# Patient Record
Sex: Female | Born: 1980 | Race: White | Hispanic: No | Marital: Married | Smoking: Former smoker
Health system: Southern US, Community
[De-identification: ages and names within clinical notes are randomized; demographics above are authoritative.]

## PROBLEM LIST (undated history)

## (undated) ENCOUNTER — Inpatient Hospital Stay: Payer: Self-pay

## (undated) DIAGNOSIS — D649 Anemia, unspecified: Secondary | ICD-10-CM

## (undated) DIAGNOSIS — Z8041 Family history of malignant neoplasm of ovary: Secondary | ICD-10-CM

## (undated) DIAGNOSIS — F419 Anxiety disorder, unspecified: Secondary | ICD-10-CM

## (undated) DIAGNOSIS — D509 Iron deficiency anemia, unspecified: Secondary | ICD-10-CM

## (undated) DIAGNOSIS — Z803 Family history of malignant neoplasm of breast: Secondary | ICD-10-CM

## (undated) DIAGNOSIS — O09522 Supervision of elderly multigravida, second trimester: Secondary | ICD-10-CM

## (undated) DIAGNOSIS — O0993 Supervision of high risk pregnancy, unspecified, third trimester: Secondary | ICD-10-CM

## (undated) DIAGNOSIS — O321XX0 Maternal care for breech presentation, not applicable or unspecified: Secondary | ICD-10-CM

## (undated) DIAGNOSIS — J45909 Unspecified asthma, uncomplicated: Secondary | ICD-10-CM

## (undated) HISTORY — DX: Anemia, unspecified: D64.9

## (undated) HISTORY — PX: APPENDECTOMY: SHX54

## (undated) HISTORY — DX: Iron deficiency anemia, unspecified: D50.9

## (undated) HISTORY — DX: Family history of malignant neoplasm of ovary: Z80.41

## (undated) HISTORY — DX: Family history of malignant neoplasm of breast: Z80.3

---
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
## 2011-10-23 DIAGNOSIS — Q249 Congenital malformation of heart, unspecified: Secondary | ICD-10-CM

## 2017-07-19 LAB — OB RESULTS CONSOLE VARICELLA ZOSTER ANTIBODY, IGG: Varicella: IMMUNE

## 2017-07-19 LAB — OB RESULTS CONSOLE TSH: TSH: 2.6

## 2017-07-19 LAB — OB RESULTS CONSOLE ANTIBODY SCREEN: Antibody Screen: NEGATIVE

## 2017-07-19 LAB — OB RESULTS CONSOLE ABO/RH: RH Type: POSITIVE

## 2017-07-19 LAB — OB RESULTS CONSOLE PLATELET COUNT: Platelets: 245

## 2017-07-19 LAB — OB RESULTS CONSOLE HIV ANTIBODY (ROUTINE TESTING): HIV: NONREACTIVE

## 2017-07-19 LAB — OB RESULTS CONSOLE RUBELLA ANTIBODY, IGM: Rubella: IMMUNE

## 2017-07-19 LAB — OB RESULTS CONSOLE HEPATITIS B SURFACE ANTIGEN: Hepatitis B Surface Ag: NEGATIVE

## 2017-07-19 LAB — OB RESULTS CONSOLE RPR: RPR: NONREACTIVE

## 2017-07-19 LAB — OB RESULTS CONSOLE HGB/HCT, BLOOD: Hemoglobin: 11.7

## 2017-08-16 ENCOUNTER — Telehealth: Payer: Self-pay

## 2017-08-16 NOTE — Telephone Encounter (Signed)
Pt reports light pink/red bleeding when wiping. She has been cramping since last night. At times it doubles her over. Advised to report to ER for eval due to time of day & apt availibility here at the office.

## 2017-08-16 NOTE — Telephone Encounter (Signed)
Pt is [redacted] wks pregnant. Her first apt is 08/29/17 w/CS. Pt states she had blood on her tissue when she wiped after urinating. Pt inquiring if she needs to see her current OB or come see us? [id number]#[phone]#[phone]

## 2017-08-16 NOTE — Telephone Encounter (Signed)
LMVM TRC. 

## 2017-08-16 NOTE — Telephone Encounter (Signed)
LMVM TRC. Pt advised can see current OB if they are able to see her as we are booked today. Need more details from patient regarding amount of blood etc. Advised if current OB unable to see we will try to fit in or she can be seen at PCP/Urgent care.

## 2017-08-16 NOTE — Telephone Encounter (Signed)
Patient returning call, same cb.  

## 2017-08-29 ENCOUNTER — Telehealth: Payer: Self-pay

## 2017-08-29 ENCOUNTER — Ambulatory Visit (INDEPENDENT_AMBULATORY_CARE_PROVIDER_SITE_OTHER): Payer: Medicaid Other | Admitting: Obstetrics and Gynecology

## 2017-08-29 ENCOUNTER — Encounter: Payer: Self-pay | Admitting: Obstetrics and Gynecology

## 2017-08-29 VITALS — BP 102/64 | Ht 66.0 in | Wt 181.0 lb

## 2017-08-29 DIAGNOSIS — O09293 Supervision of pregnancy with other poor reproductive or obstetric history, third trimester: Secondary | ICD-10-CM | POA: Insufficient documentation

## 2017-08-29 DIAGNOSIS — O09522 Supervision of elderly multigravida, second trimester: Secondary | ICD-10-CM | POA: Insufficient documentation

## 2017-08-29 DIAGNOSIS — F419 Anxiety disorder, unspecified: Secondary | ICD-10-CM | POA: Insufficient documentation

## 2017-08-29 DIAGNOSIS — O0993 Supervision of high risk pregnancy, unspecified, third trimester: Secondary | ICD-10-CM

## 2017-08-29 DIAGNOSIS — Z1379 Encounter for other screening for genetic and chromosomal anomalies: Secondary | ICD-10-CM

## 2017-08-29 DIAGNOSIS — F32A Depression, unspecified: Secondary | ICD-10-CM | POA: Insufficient documentation

## 2017-08-29 DIAGNOSIS — Z8632 Personal history of gestational diabetes: Secondary | ICD-10-CM

## 2017-08-29 DIAGNOSIS — Z3A10 10 weeks gestation of pregnancy: Secondary | ICD-10-CM

## 2017-08-29 DIAGNOSIS — O09291 Supervision of pregnancy with other poor reproductive or obstetric history, first trimester: Secondary | ICD-10-CM

## 2017-08-29 DIAGNOSIS — F329 Major depressive disorder, single episode, unspecified: Secondary | ICD-10-CM

## 2017-08-29 DIAGNOSIS — O09891 Supervision of other high risk pregnancies, first trimester: Secondary | ICD-10-CM

## 2017-08-29 DIAGNOSIS — O09521 Supervision of elderly multigravida, first trimester: Secondary | ICD-10-CM

## 2017-08-29 DIAGNOSIS — O219 Vomiting of pregnancy, unspecified: Secondary | ICD-10-CM

## 2017-08-29 HISTORY — DX: Supervision of elderly multigravida, second trimester: O09.522

## 2017-08-29 HISTORY — DX: Supervision of high risk pregnancy, unspecified, third trimester: O09.93

## 2017-08-29 MED ORDER — DOXYLAMINE SUCCINATE (SLEEP) 25 MG PO TABS: 25.0000 mg | ORAL_TABLET | Freq: Every day | ORAL | 3 refills | Status: DC

## 2017-08-29 MED ORDER — SERTRALINE HCL 50 MG PO TABS: 50.0000 mg | ORAL_TABLET | Freq: Every day | ORAL | 11 refills | Status: DC

## 2017-08-29 MED ORDER — PYRIDOXINE HCL 25 MG PO TABS: 25.0000 mg | ORAL_TABLET | Freq: Four times a day (QID) | ORAL | 3 refills | Status: DC

## 2017-08-29 MED ORDER — CITRANATAL ASSURE 35-1 & 300 MG PO MISC: 2.0000 | Freq: Every day | ORAL | 11 refills | Status: DC

## 2017-08-29 NOTE — Telephone Encounter (Signed)
Please advise. Thank you

## 2017-08-29 NOTE — Progress Notes (Signed)
NOB transfer  Worried might be carrying twins again.  C/o Nausea/having trouble sleeping, vaginal bleeding and cramping/ anxiety

## 2017-08-29 NOTE — Progress Notes (Signed)
03/07/2020   Chief Complaint: Missed period  Transfer of Care Patient: yes  History of Present Illness: Ms. Kristen Mcpherson is a 37 y.o. [id number] [redacted]w[redacted]d based on Patient's last menstrual period was 06/20/2017 (exact date). with an Estimated Date of Delivery: 03/27/18, with the above CC.   Her periods were: regular periods every 28 days She was using no method when she conceived.  She has Positive signs or symptoms of nausea/vomiting of pregnancy. She has Negative signs or symptoms of miscarriage or preterm labor She identifies Negative Zika risk factors for her and her partner On any different medications around the time she conceived/early pregnancy: No  History of varicella: Yes   ROS: A 12-point review of systems was performed and negative, except as stated in the above HPI.  OBGYN History: As per HPI. OB History  Gravida Para Term Preterm AB Living  5 5 4 1   6   SAB IAB Ectopic Multiple Live Births        1 6    # Outcome Date GA Lbr Len/2nd Weight Sex Delivery Anes PTL Lv  5 Term 03/20/18 [redacted]w[redacted]d  9 lb 14.7 oz (4.5 kg) F CS-LTranv Spinal  LIV  4A Preterm 10/23/11 [redacted]w[redacted]d  5 lb 4 oz (2.381 kg) F Vag-Spont   LIV  4B Preterm 10/23/11 [redacted]w[redacted]d  6 lb 9 oz (2.977 kg) M Vag-Spont   LIV     Complications: Heart abnormality  3 Term 10/09/05 [redacted]w[redacted]d  6 lb 7 oz (2.92 kg) M Vag-Spont   LIV  2 Term 09/20/03 [redacted]w[redacted]d  6 lb 9 oz (2.977 kg) M Vag-Spont   LIV  1 Term 07/04/99 [redacted]w[redacted]d  7 lb 14 oz (3.572 kg) M Vag-Spont   LIV    Any issues with any prior pregnancies: no Any prior children are healthy, doing well, without any problems or issues: yes History of pap smears: Yes. Last pap smear 2019. Abnormal: yes HSIL pap, had colposcopy. Patient not sure of results.  History of STIs:  History of HSV.   Past Medical History: Past Medical History:  Diagnosis Date  . AMA (advanced maternal age) multigravida 37+, second trimester 08/29/2017  . Anemia   . Anxiety   . Asthma   . Breech presentation delivered 03/20/2018   . Cesarean delivery indicated due to breech presentation 04/03/2018  . 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 Antibody:  Anatomic [place]  complete Rubella: Immune (07/19 0000)  Varicella: Immune GTT Early: WNLThird trimester:  RPR: Nonreactive (07/19 0000)  Rhogam  not needed HBsAg: Negative (07/19 0000)  TDaP vaccine                    . Iron deficiency anemia 01/08/2018    Past Surgical History: Past Surgical History:  Procedure Laterality Date  . APPENDECTOMY    . CERVICAL CONIZATION W/BX N/A 06/12/2018   Procedure: CONIZATION CERVIX WITH BIOPSY - COLD KNIFE;  Surgeon: Nadara Mustard, MD;  Location: [facility] ORS;  Service: Gynecology;  Laterality: N/A;  . CESAREAN SECTION N/A 03/20/2018   Procedure: CESAREAN SECTION;  Surgeon: Nadara Mustard, MD;  Location: [facility] ORS;  Service: Obstetrics;  Laterality: N/A;  . TUBAL LIGATION Bilateral 03/20/2018   Procedure: BILATERAL TUBAL LIGATION;  Surgeon: Nadara Mustard, MD;  Location: [facility] ORS;  Service: Obstetrics;  Laterality: Bilateral;  Family History:  Family History  Problem Relation Age of Onset  . Diabetes Mother   . Arthritis Mother   . Depression Mother   . Anxiety disorder Mother   . Anemia Mother   . Stroke Mother   . Restless legs syndrome Mother   . Hyperlipidemia Father   . Depression Father   . Hypertension Father   . ADD / ADHD Brother   . Diabetes Maternal Grandmother   . Liver disease Maternal Grandmother   . Rheum arthritis Maternal Grandfather   . Dementia Maternal Grandfather    She denies any female cancers, bleeding or blood clotting disorders.  She denies any history of mental retardation, birth defects or genetic disorders in her or the FOB's history  Social History:  Social History   Socioeconomic History  . Marital status:  Married    Spouse name: Not on file  . Number of children: 6  . Years of education: Not on file  . Highest education level: Not on file  Occupational History  . Occupation: Stay at home mother  Tobacco Use  . Smoking status: Current Every Day Smoker    Packs/day: 0.50    Types: Cigarettes    Start date: 01/02/2000  . Smokeless tobacco: Never Used  Vaping Use  . Vaping Use: Never used  Substance and Sexual Activity  . Alcohol use: Yes    Comment: once in a blue moon  . Drug use: Never  . Sexual activity: Yes    Birth control/protection: Surgical    Comment: BTL  Other Topics Concern  . Not on file  Social History Narrative   Is engaged has 6 children and her fiance has 2 children. Takes care of 8 children all together.   Social Determinants of Health   Financial Resource Strain: Not on file  Food Insecurity: Not on file  Transportation Needs: Not on file  Physical Activity: Not on file  Stress: Not on file  Social Connections: Not on file  Intimate Partner Violence: Not on file   Any pets in the household: no    Allergy: No Known Allergies  Current Outpatient Medications:  Current Outpatient Medications:  .  levonorgestrel (MIRENA) 20 MCG/24HR IUD, 1 each by Intrauterine route once., Disp: , Rfl:    Physical Exam:   BP 102/64   Ht 5\' 6"  (1.676 m)   Wt 181 lb (82.1 kg)   LMP 06/20/2017 (Exact Date)   BMI 29.21 kg/m  Body mass index is 29.21 kg/m. Constitutional: Well nourished, well developed female in no acute distress.  Neck:  Supple, normal appearance, and no thyromegaly  Cardiovascular: S1, S2 normal, no murmur, rub or gallop, regular rate and rhythm Respiratory:  Clear to auscultation bilateral. Normal respiratory effort Abdomen: positive bowel sounds and no masses, hernias; diffusely non tender to palpation, non distended Breasts: breasts appear normal, no suspicious masses, no skin or nipple changes or axillary nodes. Neuro/Psych:  Normal mood and  affect.  Skin:  Warm and dry.  Lymphatic:  No inguinal lymphadenopathy.   Pelvic exam: is not limited by body habitus EGBUS: within normal limits, Vagina: within normal limits and with no blood in the vault, Cervix: normal appearing cervix without discharge or lesions, closed/long/high, Uterus:  nonenlarged, and Adnexa:  normal adnexa  Assessment: Ms. Kristen Mcpherson is a 37 y.o. [id number] [id number] based on Patient's last menstrual period was 06/20/2017 (exact date). with an Estimated Date of Delivery: 03/27/18,  for prenatal care.  Plan:  1) Avoid alcoholic  beverages. 2) Patient encouraged not to smoke.  3) Discontinue the use of all non-medicinal drugs and chemicals.  4) Take prenatal vitamins daily.  5) Seatbelt use advised 6) Nutrition, food safety (fish, cheese advisories, and high nitrite foods) and exercise discussed. 7) Hospital and practice style delivering at [facility] discussed  8) Patient is asked about travel to areas at risk for the Zika virus, and counseled to avoid travel and exposure to mosquitoes or sexual partners who may have themselves been exposed to the virus. Testing is discussed, and will be ordered as appropriate.  9) Childbirth classes at [facility] advised  Bedside US showed singleton IUP, FHR 172 bpm Early 1hr GTT at next visit.  Need records of colposcopy, She had a HSIL pap in January of 2019.  Send B6 unisom and PNV prescriptions to pharmacy [facility] and inheritest testing today Texted information about Mychart sign up.   Adelene Idler MD [facility], [facility] 03/07/20 7:53 AM

## 2017-08-29 NOTE — Telephone Encounter (Signed)
Pt needs something else sent in, the sleeping meds and vitamin d not covered by her insurance

## 2017-08-30 MED ORDER — DOXYLAMINE-PYRIDOXINE 10-10 MG PO TBEC: 2.0000 | DELAYED_RELEASE_TABLET | Freq: Every day | ORAL | 5 refills | Status: DC

## 2017-08-30 NOTE — Telephone Encounter (Signed)
I will send diclegis, but that may not be covered either. It seems odd the B6 and unisom would not be covered, maybe her medicaid is not active for the pregnancy yet.

## 2017-09-03 ENCOUNTER — Telehealth: Payer: Self-pay

## 2017-09-03 NOTE — Telephone Encounter (Signed)
Pt requesting test results. Saw CS

## 2017-09-03 NOTE — Telephone Encounter (Signed)
Called pt to let her know that her results were not in and we would gladly call her when they come in.

## 2017-09-04 LAB — MATERNIT 21 PLUS CORE, BLOOD
Chromosome 13: NEGATIVE
Chromosome 18: NEGATIVE
Chromosome 21: NEGATIVE
Y Chromosome: NOT DETECTED

## 2017-09-04 NOTE — Progress Notes (Signed)
Normal XX

## 2017-09-05 ENCOUNTER — Other Ambulatory Visit: Payer: Medicaid Other

## 2017-09-05 ENCOUNTER — Ambulatory Visit (INDEPENDENT_AMBULATORY_CARE_PROVIDER_SITE_OTHER): Payer: Medicaid Other | Admitting: Obstetrics and Gynecology

## 2017-09-05 ENCOUNTER — Encounter: Payer: Self-pay | Admitting: Obstetrics and Gynecology

## 2017-09-05 ENCOUNTER — Other Ambulatory Visit (HOSPITAL_COMMUNITY)
Admission: RE | Admit: 2017-09-05 | Discharge: 2017-09-05 | Disposition: A | Discharge status: Home / Self Care | Payer: Medicaid Other | Source: Ambulatory Visit | Attending: Obstetrics and Gynecology | Admitting: Obstetrics and Gynecology

## 2017-09-05 VITALS — BP 118/74 | Wt 187.0 lb

## 2017-09-05 DIAGNOSIS — O09291 Supervision of pregnancy with other poor reproductive or obstetric history, first trimester: Secondary | ICD-10-CM

## 2017-09-05 DIAGNOSIS — D069 Carcinoma in situ of cervix, unspecified: Secondary | ICD-10-CM | POA: Insufficient documentation

## 2017-09-05 DIAGNOSIS — Z124 Encounter for screening for malignant neoplasm of cervix: Secondary | ICD-10-CM | POA: Diagnosis not present

## 2017-09-05 DIAGNOSIS — A630 Anogenital (venereal) warts: Secondary | ICD-10-CM | POA: Insufficient documentation

## 2017-09-05 DIAGNOSIS — O09891 Supervision of other high risk pregnancies, first trimester: Secondary | ICD-10-CM | POA: Insufficient documentation

## 2017-09-05 DIAGNOSIS — O208 Other hemorrhage in early pregnancy: Secondary | ICD-10-CM

## 2017-09-05 DIAGNOSIS — Z8632 Personal history of gestational diabetes: Secondary | ICD-10-CM

## 2017-09-05 DIAGNOSIS — Z113 Encounter for screening for infections with a predominantly sexual mode of transmission: Secondary | ICD-10-CM | POA: Diagnosis not present

## 2017-09-05 DIAGNOSIS — O99341 Other mental disorders complicating pregnancy, first trimester: Secondary | ICD-10-CM

## 2017-09-05 DIAGNOSIS — Z1379 Encounter for other screening for genetic and chromosomal anomalies: Secondary | ICD-10-CM

## 2017-09-05 DIAGNOSIS — F32A Depression, unspecified: Secondary | ICD-10-CM

## 2017-09-05 DIAGNOSIS — Z3A11 11 weeks gestation of pregnancy: Secondary | ICD-10-CM

## 2017-09-05 DIAGNOSIS — O09521 Supervision of elderly multigravida, first trimester: Secondary | ICD-10-CM

## 2017-09-05 DIAGNOSIS — F329 Major depressive disorder, single episode, unspecified: Secondary | ICD-10-CM

## 2017-09-05 DIAGNOSIS — O209 Hemorrhage in early pregnancy, unspecified: Secondary | ICD-10-CM

## 2017-09-05 DIAGNOSIS — F419 Anxiety disorder, unspecified: Secondary | ICD-10-CM

## 2017-09-05 DIAGNOSIS — F418 Other specified anxiety disorders: Secondary | ICD-10-CM

## 2017-09-05 NOTE — Progress Notes (Signed)
  Routine Prenatal Care Visit  Subjective  Kristen Mcpherson is a 37 y.o. [id number] at [redacted]w[redacted]d being seen today for ongoing prenatal care.  She is currently monitored for the following issues for this high-risk pregnancy and has History of gestational diabetes in prior pregnancy, currently pregnant in first trimester; Multigravida of advanced maternal age in first trimester; Supervision of other high risk pregnancies, first trimester; Encounter for genetic screening for Down Syndrome; Anxiety and depression; and Vaginal bleeding affecting early pregnancy on their problem list.  ----------------------------------------------------------------------------------- Patient reports mild occasional spotting. Mild uterine cramping.   Contractions: Not present. Vag. Bleeding: Scant.  Movement: Absent. Denies leaking of fluid.  ----------------------------------------------------------------------------------- The following portions of the patient's history were reviewed and updated as appropriate: allergies, current medications, past family history, past medical history, past social history, past surgical history and problem list. Problem list updated.  Objective  Blood pressure 118/74, weight 187 lb (84.8 kg), last menstrual period 06/20/2017. Pregravid weight 168 lb (76.2 kg) Total Weight Gain 19 lb (8.618 kg) Urinalysis: Urine Protein    Urine Glucose    Fetal Status: Fetal Heart Rate (bpm): 158   Movement: Absent     General:  Alert, oriented and cooperative. Patient is in no acute distress.  Skin: Skin is warm and dry. No rash noted.   Cardiovascular: Normal heart rate noted  Respiratory: Normal respiratory effort, no problems with respiration noted  Abdomen: Soft, gravid, appropriate for gestational age. Pain/Pressure: Absent     Pelvic:  Cervical exam performed      NEFG, vagina and cervix appear normal. No blood in vaginal vault.  No bleeding from cervical os.  Bimanual shows non-tender slightly  enlarged uterus without adnexal fullness.   Extremities: Normal range of motion.     Mental Status: Normal mood and affect. Normal behavior. Normal judgment and thought content.   Bedside ultrasound performed showing single, live intrauterine pregnancy with CRL consistent with GA. Cardiac activity noted. No obvious source of bleeding noted.   Assessment   37 y.o. [id number] at [redacted]w[redacted]d by  03/27/2018, by Last Menstrual Period presenting for routine prenatal visit  Plan   Pregnancy #5 Problems (from 06/20/17 to present)    Problem Noted Resolved   Vaginal bleeding affecting early pregnancy 09/05/2017 by Conard Novak, MD No      Preterm labor symptoms and general obstetric precautions including but not limited to vaginal bleeding, contractions, leaking of fluid and fetal movement were reviewed in detail with the patient. Please refer to After Visit Summary for other counseling recommendations.   - Pelvic u/s to assess bleeding (transvaginal) - pap and STD testing collected today.  Return in about 1 day (around 09/06/2017) for schedule u/s for vaginal bleeding and routine prenatal after.  Thomasene Mohair, MD, [facility], [facility] 09/05/2017 5:47 PM

## 2017-09-06 LAB — GLUCOSE TOLERANCE, 1 HOUR: Glucose, 1Hr PP: 82 mg/dL (ref 65–199)

## 2017-09-06 NOTE — Progress Notes (Signed)
Normal, Released to mychart 

## 2017-09-09 ENCOUNTER — Ambulatory Visit (INDEPENDENT_AMBULATORY_CARE_PROVIDER_SITE_OTHER): Payer: Medicaid Other | Admitting: Obstetrics & Gynecology

## 2017-09-09 ENCOUNTER — Ambulatory Visit (INDEPENDENT_AMBULATORY_CARE_PROVIDER_SITE_OTHER): Payer: Medicaid Other

## 2017-09-09 VITALS — BP 100/60 | Wt 187.0 lb

## 2017-09-09 DIAGNOSIS — O208 Other hemorrhage in early pregnancy: Secondary | ICD-10-CM | POA: Diagnosis not present

## 2017-09-09 DIAGNOSIS — O09891 Supervision of other high risk pregnancies, first trimester: Secondary | ICD-10-CM

## 2017-09-09 DIAGNOSIS — Z3A11 11 weeks gestation of pregnancy: Secondary | ICD-10-CM

## 2017-09-09 DIAGNOSIS — O209 Hemorrhage in early pregnancy, unspecified: Secondary | ICD-10-CM

## 2017-09-09 DIAGNOSIS — O09521 Supervision of elderly multigravida, first trimester: Secondary | ICD-10-CM

## 2017-09-09 DIAGNOSIS — Z3A12 12 weeks gestation of pregnancy: Secondary | ICD-10-CM

## 2017-09-09 NOTE — Progress Notes (Signed)
  Subjective  Fetal Movement? no Contractions? no Leaking Fluid? no Vaginal Bleeding? no  Objective  BP 100/60   Wt 187 lb (84.8 kg)   LMP 06/20/2017 (Exact Date)   BMI 30.18 kg/m  General: NAD Pumonary: no increased work of breathing Abdomen: gravid, non-tender Extremities: no edema Psychiatric: mood appropriate, affect full  Assessment  37 y.o. [id number] at [redacted]w[redacted]d by  03/27/2018, by Last Menstrual Period presenting for routine prenatal visit  Plan   Problem List Items Addressed This Visit      Other   Multigravida of advanced maternal age in first trimester   Supervision of other high risk pregnancies, first trimester    Other Visit Diagnoses    [redacted] weeks gestation of pregnancy    -  Primary    No recent bleeding but has had some first trimester spotting at times     Review of ULTRASOUND.     I have personally reviewed images and report of recent ultrasound done at [facility].     Plan of management to be discussed with patient. Labs done in [place], awaiting records     DNA and Glc testing results here discussed  Annamarie Major, MD, [facility], [facility] 09/09/2017  3:20 PM

## 2017-09-11 ENCOUNTER — Telehealth: Payer: Self-pay

## 2017-09-11 LAB — CYTOLOGY - PAP
Chlamydia: NEGATIVE
Diagnosis: HIGH — AB
HPV 16/18/45 genotyping: POSITIVE — AB
HPV: DETECTED — AB
Neisseria Gonorrhea: NEGATIVE

## 2017-09-11 NOTE — Telephone Encounter (Signed)
Kristen Mcpherson w/[facility] calling to report abnormal pap (HGSIL) HPV detected. Gonorrhea/Chlamydia Neg. 16 & 18 are pending. Not in Epic yet until 16 & 18 test are resulted.

## 2017-09-18 LAB — INHERITEST CORE(CF97,SMA,FRAX)

## 2017-09-20 ENCOUNTER — Telehealth: Payer: Self-pay

## 2017-09-20 ENCOUNTER — Other Ambulatory Visit: Payer: Self-pay | Admitting: Obstetrics and Gynecology

## 2017-09-20 DIAGNOSIS — Z141 Cystic fibrosis carrier: Secondary | ICD-10-CM

## 2017-09-20 NOTE — Progress Notes (Signed)
Tried to contact patient, but her phone was not in service. Will release to mychart.  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

## 2017-09-20 NOTE — Telephone Encounter (Signed)
Pt calling for pap results.

## 2017-09-23 NOTE — Telephone Encounter (Signed)
Patient is calling due to results.Patient received a call from Dr. Jerene PitchSchuman about results. Please advise if patient needs to be schedule in office or if you could call patient.

## 2017-09-23 NOTE — Telephone Encounter (Signed)
I called her back on 09/23/17 at 12:16 PM but she did not answer and her voicemail is not set up. IF she calls back I would be happy to speak with her. You can pull me from a room. I did release the result to mychart.

## 2017-09-24 ENCOUNTER — Telehealth: Payer: Self-pay | Admitting: Genetics

## 2017-09-24 NOTE — Telephone Encounter (Signed)
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  We informed her that at the cancer center we are unable to order testing for this indication and we suggested a prenatal genetic counselor would be best suited to help her with this.    [facility] They have a [place] location with inperson/on-site genetic counselors.  We sent Ms. Herendon and her OBGYN office a copy of the [facility] counsling/M[facility] referral form if she wishes to pursue.   Darral DashLindsay Smith, MS, [facility] Certified Genetic Counselor [facility] [phone]

## 2017-09-26 ENCOUNTER — Other Ambulatory Visit: Payer: Self-pay | Admitting: Obstetrics and Gynecology

## 2017-09-26 DIAGNOSIS — Z141 Cystic fibrosis carrier: Secondary | ICD-10-CM

## 2017-10-03 ENCOUNTER — Other Ambulatory Visit: Payer: Self-pay

## 2017-10-03 DIAGNOSIS — O09522 Supervision of elderly multigravida, second trimester: Secondary | ICD-10-CM

## 2017-10-07 ENCOUNTER — Ambulatory Visit (INDEPENDENT_AMBULATORY_CARE_PROVIDER_SITE_OTHER): Payer: Medicaid Other | Admitting: Obstetrics and Gynecology

## 2017-10-07 ENCOUNTER — Encounter: Payer: Medicaid Other | Admitting: Obstetrics and Gynecology

## 2017-10-07 ENCOUNTER — Telehealth: Payer: Self-pay | Admitting: Genetics

## 2017-10-07 ENCOUNTER — Ambulatory Visit: Payer: Medicaid Other

## 2017-10-07 ENCOUNTER — Ambulatory Visit
Admission: RE | Admit: 2017-10-07 | Discharge: 2017-10-07 | Disposition: A | Discharge status: Home / Self Care | Payer: Medicaid Other | Source: Ambulatory Visit | Attending: Obstetrics and Gynecology | Admitting: Obstetrics and Gynecology

## 2017-10-07 VITALS — BP 118/74 | Wt 194.0 lb

## 2017-10-07 DIAGNOSIS — Z141 Cystic fibrosis carrier: Secondary | ICD-10-CM

## 2017-10-07 DIAGNOSIS — O208 Other hemorrhage in early pregnancy: Principal | ICD-10-CM

## 2017-10-07 DIAGNOSIS — O09522 Supervision of elderly multigravida, second trimester: Secondary | ICD-10-CM

## 2017-10-07 DIAGNOSIS — Z3A15 15 weeks gestation of pregnancy: Secondary | ICD-10-CM

## 2017-10-07 DIAGNOSIS — O209 Hemorrhage in early pregnancy, unspecified: Secondary | ICD-10-CM

## 2017-10-07 DIAGNOSIS — O0972 Supervision of high risk pregnancy due to social problems, second trimester: Secondary | ICD-10-CM

## 2017-10-07 DIAGNOSIS — R87613 High grade squamous intraepithelial lesion on cytologic smear of cervix (HGSIL): Secondary | ICD-10-CM | POA: Insufficient documentation

## 2017-10-07 HISTORY — DX: Unspecified asthma, uncomplicated: J45.909

## 2017-10-07 NOTE — Progress Notes (Signed)
Referring Provider:  Length of Consultation: 30 minutes   Kristen Mcpherson  was referred to [facility] [place] for genetic counseling to review abnormal cystic fibrosis carrier results.  Kristen Mcpherson was [redacted]w[redacted]d at the time of counseling.  EDD is 03/27/2018, confirmed by ultrasound.  This note summarizes the information we discussed.    Pregnancy/Family History We obtained a detailed family history and pregnancy history. This is the 5th pregnancy for the patient.  She has three boys and a set of twins, one female and one female, from prior relationships who are healthy.  She also reports a history of 4 first trimester losses due to unknown causes.  This is the first pregnancy with her boyfriend, Alycia Rossetti, age 60.  Alycia Rossetti has two children from a prior relationship who are in good health.    She reports that her youngest son, age 23, was born with a congenital heart defect.  He has not required surgical intervention at this time and is followed annually by cardiology.  The patient reports that a first cousin was also born with a congential heart defect, however, she required surgery in childhood.  They both are otherwise in good health. We discussed that congenital heart defects (CHDs) occur in approximately 1 in 200 births.  Congenital heart defects are often thought to be multifactorial, meaning due to complex interactions between genetic and environmental factors, although some specific genetic changes and syndromes are associated with congenital heart defects.  It would be important to know what tests were done on these individuals in order to better assess the risk in this family.   In the absence of an underlying genetic condition, the chance of congenital heart defects in half siblings of individuals affected individuals is thought to be approximately 1-3%.  For this reason, it would be reasonable to consider a fetal echocardiogram at 22 to 24 weeks in this and subsequent pregnancies for this  couple.    In regards to her history of recurrent pregnancy loss, the patient reports that she has not had a work-up.  We discussed the availability of testing options, however, the patient was not interested in this discussion today.  For reference, approximately 15-20% of recognized pregnancies end in miscarriage.  There are various possible causes of recurrent pregnancy loss, including infectious, endocrine, anatomic, immunologic, and genetic causes. Fetal chromosome abnormalities appear to be the most common cause of pregnancy loss, accounting for up to 50% of losses.  Most of these chromosome abnormalities involve numerical aberrations, such as trisomies, monosomy X (Turner syndrome), triploidy, and tetraploidy.  Such chromosome abnormalities typically occur sporadically and are not caused by a parental chromosome abnormality.  In contrast, a small percentage of chromosome abnormalities observed among abortuses occur because a parent carries a balanced chromosome rearrangement, such as a translocation or inversion.  Parents with a balanced chromosome rearrangement have no health problems related to their rearrangement.  However, they are at an increased risk for passing down an unbalanced chromosome rearrangement, which may result in pregnancy loss or a child with birth defects and/or intellectual disability.  In couples who have had two or more pregnancy losses, a chromosome rearrangement is found in one member of the couple in about 3-5% of cases.  Another genetic cause of recurrent pregnancy loss that was discussed is the presence of an inherited single-gene disorder.  It is possible for both parents to carry a single-gene mutation that is lethal when the fetus receives the harmful gene mutation from both parents.  A mother can also carry a single-gene mutation that is lethal when passed down to a female fetus.  Unfortunately, there are no general screening methods to determine whether a couple carries  gene mutations that could result in pregnancy loss or a child with a single-gene syndrome.  Testing is only available if a family history is suggestive of a specific inherited disorder.  In this event, there is sometimes biochemical and/or molecular testing that can identify carrier parents and affected fetuses in subsequent pregnancies. As Kristen Mcpherson's family history does not suggest the presence of a particular single-gene disorder, no testing is available to the couple to clarify whether a single-gene syndrome could be responsible for their pregnancy losses.  Should the patient desire work-up for possible etiologies for pregnancy loss at a later time, we would offer parental blood karyotype and APS labs.    The patient reports that her paternal grandmother and her great aunt had a "eye disorder that causes blindness." She reports that this developed later in the life.  She does not know the name of the condition in the family but has been told that it may be genetic.  At this time, her father has not developed features of the eye disorder in the family.  The patient reports that she has had eye exams but currently "does not show any features of the disease."  She is encouraged to continue follow up with her eye doctor.  Lastly, the patient reports that her maternal uncle passed from a heart attack in his 30-40's. She reports an autopsy was done but the family does not know the underlying cause for his heart attack.  She speculates that it may be related to life style. Without further details, we cannot adequately assess risk for recurrence in the family.  She is encouraged to share this family history with her primary care provider.       The patient reports [place], [place] and Native American ancestry.  Her partner reports mostly Caucasian ancestry.  He believes he may have some [place] and Native American ancestry as well.  The remainder of the family history was reported to be unremarkable for birth  defects, intellectual delays, recurrent pregnancy loss or known chromosome abnormalities.  Kristen Mcpherson reports some spotting/bleeding throughout pregnancy.  She has not had any bleeding within the last month.  She reported denies any exposures in pregnancy.  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  We briefly reviewed the common features associated with classic cystic fibrosis (CF).  CF is characterized by defective chloride transport resulting in the production of thick mucus in the lungs and pancreas.  The primary manifestations include pulmonary infection and progressive pulmonary disease.  Pancreatic insufficiency and intestinal malabsorption are present in 85% of affected individuals.  CF is an extremely variable condition in that some individuals have mild manifestations and are not diagnosed until adulthood while others are severely affected and die in childhood. A cure for cystic fibrosis is not currently available, but aggressive medical therapy has resulted in an increased survival.  The average lifespan is approximately 35 years of age and even longer in those individuals who do not have pancreatic insufficiency or those with non-classic CF.    We discussed the autosomal recessive inheritance of cystic fibrosis.  The patient was informed that individuals that have been identified as being CF gene carriers have a single CF gene variant.  It was noted that CF gene carriers  typically have no health problems related to their carrier status. However, when two gene carriers have a child together, there is a 1 in 4 (25%) chance, with each pregnancy, of having a child affected with cystic fibrosis.  They also have a 50% chance of having a child who is an unaffected gene carrier and a 25% chance of having a child who does not inherit any cystic fibrosis gene variants.  The patients partner, Alycia Rossetti, has not had CF carrier testing at this time.   Based on carrier frequencies, Alycia Rossetti has a 1 in 25 chance of being a carrier for CF.  Given the known information, there is a 1 in 100 chance that the pregnancy may be affected with CF (=1/25 x 1/2 x 1/2).  We discussed that there are various options for carrier testing, including the 97 mutation panel available through [facility].  Should they both be identified as carrier, prenatal diagnosis via amniocentesis would be available to the couple.  Alternatively, postnatal testing via [place] newborn screening or targeted mutation analysis, if both parental variants were known, would be available after delivery.  In regards to prenatal diagnosis, we reviewed that an amniocentesis involves the removal of a small amount of amniotic fluid from the sac surrounding the fetus with the use of a thin needle inserted through the maternal abdomen and uterus.  Ultrasound guidance is used throughout the procedure.  Fetal cells from amniotic fluid are directly evaluated and > 99.5% of chromosome problems and > 98% of open neural tube defects can be detected. This procedure is generally performed after the 15th week of pregnancy.  The main risks to this procedure include complications leading to miscarriage in less than 1 in 200 cases (0.5%).  The couple disclosed that they are currently undecided regarding carrier screening, as they do not believe that they pursue prenatal diagnosis, even if they were both found to be carriers.  In addition, Alycia Rossetti reports that he does not have medical insurance, therefore, testing would be out of pocket expense.  They will plan to further discuss their desire for testing and follow up with genetics should they elect to move forward.  We will contact the patient regarding the possible out of pocket cost for CF testing through [facility].    Of note, Kristen Mcpherson had cfDNA screening (MaterniT21) performed earlier in pregnancy.  The patient's specimen showed DNA consistent with two copies of chromosomes 21,  18 and 13.  The sensitivity for trisomy 38, trisomy 83 and trisomy 64 using this testing are reported as 99.1%, 99.9% and 91.7% respectively.  Thus, while the results of this testing are highly accurate, they are not considered diagnostic at this time.  Should more definitive information be desired, the patient may still consider amniocentesis. Results for fetal sex prediction returned consistent with a female fetus. This is predicted with >99% accuracy.  Amniocentesis is available for confirmation studies.  Ms. Bertis Ruddy also had normal SMA and Fragile X carrier screening. These results are available for review in the medical record.    Plan After consideration of the options, Kristen Mcpherson and her partner elected to decline cystic fibrosis carrier testing today.  They will further discuss their options and follow up with prenatal genetics, should they desire CF carrier testing for Ryan.  Kristen Mcpherson was encouraged to call with questions or concerns.  We can be contacted at [phone].  Labs ordered: None  Rosalva Ferron, MS, CGC performed an integral service incident to the physician's initial service.  I was physically present in the clinical area and was immediately available to render assistance.   Katlin Ciszewski C Jabe Jeanbaptiste

## 2017-10-07 NOTE — Progress Notes (Signed)
No vb. No lof.  

## 2017-10-07 NOTE — Telephone Encounter (Signed)
Patient returned our call regarding out of pocket cost for carrier screening of cystic fibrosis in her partner.  She is aware that the max out of pocket is $299.  She did not make a decision regarding testing at the time of the phone call.

## 2017-10-07 NOTE — Telephone Encounter (Signed)
Unavailable and no voicemail.  Out of pocket cost for CF carrier screening for FOB is estimated to be $299 ([facility]).

## 2017-10-07 NOTE — Progress Notes (Signed)
Routine Prenatal Care Visit  Subjective  Kristen Mcpherson is a 37 y.o. [id number] at [redacted]w[redacted]d being seen today for ongoing prenatal care.  She is currently monitored for the following issues for this high-risk pregnancy and has History of gestational diabetes in prior pregnancy, currently pregnant in first trimester; AMA (advanced maternal age) multigravida 35+, second trimester; Supervision of high risk pregnancy due to social problems in second trimester; Encounter for genetic screening for Down Syndrome; Anxiety and depression; Vaginal bleeding affecting early pregnancy; Cystic fibrosis carrier; and HSIL on Pap smear of cervix on their problem list.  ----------------------------------------------------------------------------------- Patient reports no complaints.   Contractions: Not present. Vag. Bleeding: None.  Movement: Absent. Denies leaking of fluid.  ----------------------------------------------------------------------------------- The following portions of the patient's history were reviewed and updated as appropriate: allergies, current medications, past family history, past medical history, past social history, past surgical history and problem list. Problem list updated.   Objective  Blood pressure 118/74, weight 194 lb (88 kg), last menstrual period 06/20/2017. Pregravid weight 168 lb (76.2 kg) Total Weight Gain 26 lb (11.8 kg) Urinalysis:      Fetal Status: Fetal Heart Rate (bpm): 140   Movement: Absent     General:  Alert, oriented and cooperative. Patient is in no acute distress.  Skin: Skin is warm and dry. No rash noted.   Cardiovascular: Normal heart rate noted  Respiratory: Normal respiratory effort, no problems with respiration noted  Abdomen: Soft, gravid, appropriate for gestational age. Pain/Pressure: Present     Pelvic:  Cervical exam deferred        Extremities: Normal range of motion.     ental Status: Normal mood and affect. Normal behavior. Normal judgment and  thought content.     Assessment   37 y.o. [id number] at [redacted]w[redacted]d by  03/27/2018, by Last Menstrual Period presenting for routine prenatal visit  Plan   Pregnancy #5 Problems (from 06/20/17 to present)    Problem Noted Resolved   Cystic fibrosis carrier 10/07/2017 by Rosalva Ferron No   Overview Signed 10/07/2017 10:48 AM by Rosalva Ferron    10/07/17 GC for CF carrier status.  Couple undecided regarding paternal CF carrier screening.       Vaginal bleeding affecting early pregnancy 09/05/2017 by Conard Novak, MD No   AMA (advanced maternal age) multigravida 35+, second trimester 08/29/2017 by Natale Milch, MD No   Supervision of high risk pregnancy due to social problems in second trimester 08/29/2017 by Natale Milch, MD No   Overview Addendum 10/07/2017 12:47 PM by Natale Milch, MD    Clinic [facility] Prenatal Labs  Dating [place] Blood type:   A +  Genetic Screen NIPS: Normal 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 Antibody:   Anatomic [place]  Rubella:   Immune Varicella: Immune  GTT Early: WNLThird trimester:  RPR:     Rhogam  not needed HBsAg:     TDaP vaccine                        Flu Shot: Declines HIV:     Baby Food                                GBS:   Contraception  Mirena? Pap: HSIL HPV +  CBB     CS/VBAC  HSV+ needs valtrex at 36 weeks [ ]   Support  Person              Gestational age appropriate obstetric precautions including but not limited to vaginal bleeding, contractions, leaking of fluid and fetal movement were reviewed in detail with the patient.    She saw genetics and MFM this morning. She declines further testing of the FOB or the pregnancy until after delivery.  Has Colposcopy scheduled this week with Dr. Jean Rosenthal.   Return in about 4 weeks (around 11/04/2017) for ROB and [place].  Adelene Idler MD [facility], [facility] 10/07/17 12:47 PM

## 2017-10-11 ENCOUNTER — Encounter: Payer: Self-pay | Admitting: Obstetrics and Gynecology

## 2017-10-11 ENCOUNTER — Ambulatory Visit (INDEPENDENT_AMBULATORY_CARE_PROVIDER_SITE_OTHER): Payer: Medicaid Other | Admitting: Obstetrics and Gynecology

## 2017-10-11 VITALS — BP 118/78 | Ht 66.0 in | Wt 194.0 lb

## 2017-10-11 DIAGNOSIS — R87613 High grade squamous intraepithelial lesion on cytologic smear of cervix (HGSIL): Secondary | ICD-10-CM | POA: Diagnosis not present

## 2017-10-11 DIAGNOSIS — Z113 Encounter for screening for infections with a predominantly sexual mode of transmission: Secondary | ICD-10-CM

## 2017-10-11 DIAGNOSIS — O0972 Supervision of high risk pregnancy due to social problems, second trimester: Secondary | ICD-10-CM

## 2017-10-11 NOTE — Progress Notes (Signed)
HPI:  Kristen Mcpherson is a 37 y.o.  [id number]  who presents today for evaluation and management of abnormal cervical cytology.    Dysplasia History:  HSIL pap smear   OB History  Gravida Para Term Preterm AB Living  5 4 3 1   5   SAB TAB Ectopic Multiple Live Births        1 5    # Outcome Date GA Lbr Len/2nd Weight Sex Delivery Anes PTL Lv  5 Current           4A Preterm 10/23/11 [redacted]w[redacted]d  5 lb 4 oz (2.381 kg) F Vag-Spont   LIV  4B Preterm 10/23/11 [redacted]w[redacted]d  6 lb 9 oz (2.977 kg) M Vag-Spont   LIV     Complications: Heart abnormality  3 Term 10/09/05 [redacted]w[redacted]d  6 lb 7 oz (2.92 kg) M Vag-Spont   LIV  2 Term 09/20/03 [redacted]w[redacted]d  6 lb 9 oz (2.977 kg) M Vag-Spont   LIV  1 Term 07/04/99 [redacted]w[redacted]d  7 lb 14 oz (3.572 kg) M Vag-Spont   LIV    Past Medical History:  Diagnosis Date  . Asthma     History reviewed. No pertinent surgical history.  SOCIAL HISTORY:  Social History   Substance and Sexual Activity  Alcohol Use Not Currently    Social History   Substance and Sexual Activity  Drug Use Never     Family History  Problem Relation Age of Onset  . Diabetes Mother     ALLERGIES:  Patient has no known allergies.  Current Outpatient Medications on File Prior to Visit  Medication Sig Dispense Refill  . Acetaminophen (TYLENOL PO) Take by mouth.    . doxylamine, Sleep, (UNISOM) 25 MG tablet Take 1 tablet (25 mg total) by mouth at bedtime. (Patient not taking: Reported on 10/07/2017) 30 tablet 3  . Doxylamine-Pyridoxine (DICLEGIS) 10-10 MG TBEC Take 2 tablets by mouth at bedtime. If symptoms persist, add one tablet in the morning and one in the afternoon 100 tablet 5  . Prenat w/o A-FeCbGl-DSS-FA-DHA (CITRANATAL ASSURE) 35-1 & 300 MG tablet Take 2 tablets by mouth daily. 60 tablet 11  . pyridOXINE (VITAMIN B-6) 25 MG tablet Take 1 tablet (25 mg total) by mouth 4 (four) times daily. (Patient not taking: Reported on 10/07/2017) 120 tablet 3  . sertraline (ZOLOFT) 50 MG tablet Take 1 tablet  (50 mg total) by mouth daily. 30 tablet 11   No current facility-administered medications on file prior to visit.     Physical Exam: -Vitals:  BP 118/78   Ht 5\' 6"  (1.676 m)   Wt 194 lb (88 kg)   LMP 06/20/2017 (Exact Date)   BMI 31.31 kg/m  GEN: WD, WN, NAD.  A+ O x 3, good mood and affect. ABD:  NT, ND.  Soft, no masses.  No hernias noted.   Pelvic:   Vulva: Normal appearance.  No lesions.  Vagina: No lesions or abnormalities noted.  Support: Normal pelvic support.  Urethra No masses tenderness or scarring.  Meatus Normal size without lesions or prolapse.  Cervix: See below.  Anus: Normal exam.  No lesions.  Perineum: Normal exam.  No lesions.        Bimanual   Uterus: Normal size.  Non-tender.  Mobile.  AV.  Adnexae: No masses.  Non-tender to palpation.  Cul-de-sac: Negative for abnormality.   PROCEDURE: 1.  Urine Pregnancy Test:  Not done.  Patient is [redacted] weeks pregnant. 2.  Colposcopy performed  with 4% acetic acid after verbal consent obtained                                         -Aceto-white Lesions Location(s): diffusely.              -Biopsy not performed due to no obvious CIN III.                -ECC indicated and performed: No.   -Satisfactory colposcopy: No.    -Evidence of Invasive cervical CA :  NO  ASSESSMENT:  Kristen Mcpherson is a 37 y.o. [id number] here for  1. HSIL on Pap smear of cervix   2. Supervision of high risk pregnancy due to social problems in second trimester   .  PLAN: Discussed high-grade Pap smear in the setting of pregnancy.  No definitive cancerous lesion seen.  Some findings potentially consistent with severe grade dysplasia noted.  However we discussed that in the setting of severe dysplasia without invasive cancer would continue to monitor during pregnancy with plan for treatment after pregnancy.  We discussed the importance of following up and the risk of developing invasive cancer if she does not.  We also discussed the limitations  of colposcopy, especially in pregnancy.      Thomasene Mohair, MD  [facility], [facility] 10/11/2017  12:58 PM

## 2017-10-11 NOTE — Addendum Note (Signed)
Addended by: Thomasene Mohair D on: 10/11/2017 04:39 PM   Modules accepted: Orders

## 2017-10-16 LAB — CHLAMYDIA/GONOCOCCUS/TRICHOMONAS, NAA
Chlamydia by NAA: NEGATIVE
Gonococcus by NAA: NEGATIVE
Trich vag by NAA: NEGATIVE

## 2017-11-04 ENCOUNTER — Encounter: Payer: Self-pay | Admitting: Advanced Practice Midwife

## 2017-11-04 ENCOUNTER — Ambulatory Visit (INDEPENDENT_AMBULATORY_CARE_PROVIDER_SITE_OTHER): Payer: Medicaid Other | Admitting: Advanced Practice Midwife

## 2017-11-04 ENCOUNTER — Ambulatory Visit (INDEPENDENT_AMBULATORY_CARE_PROVIDER_SITE_OTHER): Payer: Medicaid Other

## 2017-11-04 VITALS — BP 122/76 | Wt 198.0 lb

## 2017-11-04 DIAGNOSIS — Z3A15 15 weeks gestation of pregnancy: Secondary | ICD-10-CM

## 2017-11-04 DIAGNOSIS — O0972 Supervision of high risk pregnancy due to social problems, second trimester: Secondary | ICD-10-CM

## 2017-11-04 DIAGNOSIS — Z3A19 19 weeks gestation of pregnancy: Secondary | ICD-10-CM

## 2017-11-04 DIAGNOSIS — Z363 Encounter for antenatal screening for malformations: Secondary | ICD-10-CM | POA: Diagnosis not present

## 2017-11-04 DIAGNOSIS — O09522 Supervision of elderly multigravida, second trimester: Secondary | ICD-10-CM

## 2017-11-04 NOTE — Progress Notes (Signed)
Anatomy scan toay. No vb. No lof.

## 2017-11-04 NOTE — Progress Notes (Signed)
Routine Prenatal Care Visit  Subjective  Kristen Mcpherson is a 37 y.o. [id number] at [redacted]w[redacted]d being seen today for ongoing prenatal care.  She is currently monitored for the following issues for this high-risk pregnancy and has History of gestational diabetes in prior pregnancy, currently pregnant in first trimester; AMA (advanced maternal age) multigravida 35+, second trimester; Supervision of high risk pregnancy due to social problems in second trimester; Encounter for genetic screening for Down Syndrome; Anxiety and depression; Vaginal bleeding affecting early pregnancy; Cystic fibrosis carrier; and HSIL on Pap smear of cervix on their problem list.  ----------------------------------------------------------------------------------- Patient reports urine leakage. Recommended Kegel exercises. She has some headaches since the start of the pregnancy. Recommended increased hydration and discussed other triggers and comfort measures. She and her partner have decided against genetic screeening at this time for him for CF.    Contractions: Not present. Vag. Bleeding: None.  Movement: Present. Denies leaking of fluid.  ----------------------------------------------------------------------------------- The following portions of the patient's history were reviewed and updated as appropriate: allergies, current medications, past family history, past medical history, past social history, past surgical history and problem list. Problem list updated.   Objective  Blood pressure 122/76, weight 198 lb (89.8 kg), last menstrual period 06/20/2017. Pregravid weight 168 lb (76.2 kg) Total Weight Gain 30 lb (13.6 kg) Urinalysis: Urine Protein    Urine Glucose    Fetal Status: Fetal Heart Rate (bpm): 153   Movement: Present  Presentation: Vertex  General:  Alert, oriented and cooperative. Patient is in no acute distress.  Skin: Skin is warm and dry. No rash noted.   Cardiovascular: Normal heart rate noted    Respiratory: Normal respiratory effort, no problems with respiration noted  Abdomen: Soft, gravid, appropriate for gestational age. Pain/Pressure: Absent     Pelvic:  Cervical exam deferred        Extremities: Normal range of motion.  Edema: None  Mental Status: Normal mood and affect. Normal behavior. Normal judgment and thought content.   Assessment   37 y.o. [id number] at [redacted]w[redacted]d by  03/27/2018, by Last Menstrual Period presenting for routine prenatal visit  Plan   Pregnancy #5 Problems (from 06/20/17 to present)    Problem Noted Resolved   Cystic fibrosis carrier 10/07/2017 by Rosalva Ferron No   Overview Signed 10/07/2017 10:48 AM by Rosalva Ferron    10/07/17 GC for CF carrier status.  Couple undecided regarding paternal CF carrier screening.       Vaginal bleeding affecting early pregnancy 09/05/2017 by Conard Novak, MD No   AMA (advanced maternal age) multigravida 35+, second trimester 08/29/2017 by Natale Milch, MD No   Supervision of high risk pregnancy due to social problems in second trimester 08/29/2017 by Natale Milch, MD No   Overview Addendum 10/07/2017 12:57 PM by Natale Milch, MD    Clinic [facility] Prenatal Labs  Dating [place] Blood type: A/Positive/-- (07/19 0000) A +  Genetic Screen NIPS: Normal 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 Antibody:   Anatomic [place]  Rubella: Immune (07/19 0000)  Varicella: Immune  GTT Early: WNLThird trimester:  RPR: Nonreactive (07/19 0000)   Rhogam  not needed HBsAg: Negative (07/19 0000)   TDaP vaccine                        Flu Shot: Declines HIV: Non-reactive (07/19 0000)   Baby Food  GBS:   Contraception  Mirena? Pap: HSIL HPV +  CBB     CS/VBAC  HSV+ needs valtrex at 36 weeks [ ]   Support Person              Preterm labor symptoms and general obstetric precautions including but not limited to vaginal bleeding, contractions, leaking of fluid and  fetal movement were reviewed in detail with the patient.    Return in about 4 weeks (around 12/02/2017) for rob.  Tresea Mall, CNM 11/04/2017 9:47 AM

## 2017-11-15 ENCOUNTER — Encounter: Payer: Self-pay | Admitting: Maternal Newborn

## 2017-11-15 ENCOUNTER — Ambulatory Visit (INDEPENDENT_AMBULATORY_CARE_PROVIDER_SITE_OTHER): Payer: Medicaid Other | Admitting: Maternal Newborn

## 2017-11-15 ENCOUNTER — Telehealth: Payer: Self-pay

## 2017-11-15 VITALS — BP 106/62 | Wt 197.0 lb

## 2017-11-15 DIAGNOSIS — Z3A21 21 weeks gestation of pregnancy: Secondary | ICD-10-CM

## 2017-11-15 DIAGNOSIS — M79621 Pain in right upper arm: Secondary | ICD-10-CM

## 2017-11-15 LAB — POCT URINALYSIS DIPSTICK OB
Glucose, UA: NEGATIVE
POC,PROTEIN,UA: NEGATIVE

## 2017-11-15 NOTE — Telephone Encounter (Signed)
Pt calling to be seen today; has a lump under right arm next to armpit that is very painful.  [phone]  Pt aware to be here at 11:30 to see Jaci.

## 2017-11-15 NOTE — Progress Notes (Signed)
Prenatal Problem Visit  Subjective  Kristen Mcpherson is a 37 y.o. Z6X0960 at [redacted]w[redacted]d being seen today for ongoing prenatal care.  She is currently monitored for the following issues for this high-risk pregnancy and has History of gestational diabetes in prior pregnancy, currently pregnant in first trimester; AMA (advanced maternal age) multigravida 35+, second trimester; Supervision of high risk pregnancy due to social problems in second trimester; Encounter for genetic screening for Down Syndrome; Anxiety and depression; Vaginal bleeding affecting early pregnancy; Cystic fibrosis carrier; and HSIL on Pap smear of cervix on their problem list.  ----------------------------------------------------------------------------------- Patient reports a tender lump under her right arm that developed yesterday. She has not shaved under her arms recently. She denies changing personal care products and is not wearing a bra that rubs the area. She currently has a URI. Review of systems otherwise negative. ----------------------------------------------------------------------------------- The following portions of the patient's history were reviewed and updated as appropriate: allergies, current medications, past family history, past medical history, past social history, past surgical history and problem list. Problem list updated.   Objective  Blood pressure 106/62, weight 197 lb (89.4 kg), last menstrual period 06/20/2017. Pregravid weight 168 lb (76.2 kg) Total Weight Gain 29 lb (13.2 kg)  General:  Alert, oriented and cooperative. Patient is in no acute distress.  Skin: Skin is warm and dry. No rash noted.   Cardiovascular: Normal heart rate noted  Respiratory: Normal respiratory effort, no problems with respiration noted  Abdomen: Soft, gravid, appropriate for gestational age.       Pelvic:  Cervical exam deferred        Extremities: Normal range of motion.     Mental Status: Normal mood and affect.  Normal behavior. Normal judgment and thought content.   Right axillary area is swollen as compared to the left. The skin is a normal color.  There are no palpable lumps or swollen lymph nodes, but generalized swelling of the area on both the upper and lower halves of the right axilla. She reports that the upper swollen area is more tender. There is an ingrown hair visible, but it is not reddened or swollen, and the problem areas are not in the same location. There are no other skin lesions in the area.  She reports that the feeling is similar to when she had breast engorgement with a previous pregnancy. Swelling is not contiguous with the right breast.  Assessment   37 y.o. A5W0981 at [redacted]w[redacted]d, EDD 03/27/2018 by Last Menstrual Period presenting for a work-in prenatal visit.  Plan   Pregnancy #5 Problems (from 06/20/17 to present)    Problem Noted Resolved   Cystic fibrosis carrier 10/07/2017 by Rosalva Ferron No   Overview Signed 10/07/2017 10:48 AM by Rosalva Ferron    10/07/17 GC for CF carrier status.  Couple undecided regarding paternal CF carrier screening.       Vaginal bleeding affecting early pregnancy 09/05/2017 by Conard Novak, MD No   AMA (advanced maternal age) multigravida 35+, second trimester 08/29/2017 by Natale Milch, MD No   Supervision of high risk pregnancy due to social problems in second trimester 08/29/2017 by Natale Milch, MD No   Overview Addendum 10/07/2017 12:57 PM by Natale Milch, MD    [facility] Prenatal Labs  Dating [place] Blood type: A/Positive/-- (07/19 0000) A +  Genetic Screen NIPS: Normal 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 Antibody:   Anatomic [place]  Rubella: Immune (07/19 0000)  Varicella: Immune  GTT Early: WNLThird trimester:  RPR: Nonreactive (07/19 0000)   Rhogam  not needed HBsAg: Negative (07/19 0000)   TDaP vaccine                        Flu Shot: Declines HIV: Non-reactive (07/19 0000)     Baby Food                                GBS:   Contraception  Mirena? Pap: HSIL HPV +  CBB     CS/VBAC  HSV+ needs valtrex at 36 weeks [ ]   Support Person             Advised using warm moist compresses on the right axillary area and taking Tylenol to help with the tenderness. If no improvement, or if she develops a fever, she will notify us and will consider an Rx for antibiotics.  Marcelyn BruinsJacelyn Carvin Almas, CNM 11/15/2017  11:43 AM

## 2017-11-15 NOTE — Progress Notes (Signed)
ROB- lump under right armpit, swollen noticed it as of yesterday

## 2017-12-02 ENCOUNTER — Ambulatory Visit (INDEPENDENT_AMBULATORY_CARE_PROVIDER_SITE_OTHER): Payer: Medicaid Other | Admitting: Maternal Newborn

## 2017-12-02 VITALS — BP 114/70 | Wt 200.0 lb

## 2017-12-02 DIAGNOSIS — O0972 Supervision of high risk pregnancy due to social problems, second trimester: Secondary | ICD-10-CM

## 2017-12-02 DIAGNOSIS — Z3A23 23 weeks gestation of pregnancy: Secondary | ICD-10-CM

## 2017-12-02 LAB — POCT URINALYSIS DIPSTICK OB: Glucose, UA: NEGATIVE

## 2017-12-02 NOTE — Patient Instructions (Signed)

## 2017-12-02 NOTE — Progress Notes (Signed)
Routine Prenatal Care Visit  Subjective  Kristen Mcpherson is a 37 y.o. Z6X0960 at [redacted]w[redacted]d being seen today for ongoing prenatal care.  She is currently monitored for the following issues for this high-risk pregnancy and has History of gestational diabetes in prior pregnancy, currently pregnant in first trimester; AMA (advanced maternal age) multigravida 35+, second trimester; Supervision of high risk pregnancy due to social problems in second trimester; Encounter for genetic screening for Down Syndrome; Anxiety and depression; Vaginal bleeding affecting early pregnancy; Cystic fibrosis carrier; and HSIL on Pap smear of cervix on their problem list.  ----------------------------------------------------------------------------------- Patient reports carpal tunnel symptoms and leg cramps.   Contractions: Not present. Vag. Bleeding: None.  Movement: Present. No leaking of fluid.  ----------------------------------------------------------------------------------- The following portions of the patient's history were reviewed and updated as appropriate: allergies, current medications, past family history, past medical history, past social history, past surgical history and problem list. Problem list updated.  Objective  Blood pressure 114/70, weight 200 lb (90.7 kg), last menstrual period 06/20/2017. Pregravid weight 168 lb (76.2 kg) Total Weight Gain 32 lb (14.5 kg) Body mass index is 32.28 kg/m.   Urinalysis: Protein Trace, Glucose Negative  Fetal Status: Fetal Heart Rate (bpm): 148 Fundal Height: 24 cm Movement: Present     General:  Alert, oriented and cooperative. Patient is in no acute distress.  Skin: Skin is warm and dry. No rash noted.   Cardiovascular: Normal heart rate noted  Respiratory: Normal respiratory effort, no problems with respiration noted  Abdomen: Soft, gravid, appropriate for gestational age. Pain/Pressure: Absent     Pelvic:  Cervical exam deferred        Extremities:  Normal range of motion.  Edema: None  Mental Status: Normal mood and affect. Normal behavior. Normal judgment and thought content.     Assessment   37 y.o. A5W0981 at [redacted]w[redacted]d, EDD 03/27/2018 by Last Menstrual Period presenting for a routine prenatal visit.  Plan   Pregnancy #5 Problems (from 06/20/17 to present)    Problem Noted Resolved   Cystic fibrosis carrier 10/07/2017 by Kristen Mcpherson No   Overview Signed 10/07/2017 10:48 AM by Kristen Mcpherson    10/07/17 GC for CF carrier status.  Couple undecided regarding paternal CF carrier screening.       Vaginal bleeding affecting early pregnancy 09/05/2017 by Kristen Novak, MD No   AMA (advanced maternal age) multigravida 35+, second trimester 08/29/2017 by Kristen Milch, MD No   Supervision of high risk pregnancy due to social problems in second trimester 08/29/2017 by Kristen Milch, MD No   Overview Addendum 10/07/2017 12:57 PM by Kristen Milch, MD    [facility] Prenatal Labs  Dating Kristen Mcpherson: A/Positive/-- (07/19 0000) A +  Genetic Screen NIPS: Normal 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 Antibody:   Anatomic [place]  Rubella: Immune (07/19 0000)  Varicella: Immune  GTT Early: WNLThird trimester:  RPR: Nonreactive (07/19 0000)   Rhogam  not needed HBsAg: Negative (07/19 0000)   TDaP vaccine                        Flu Shot: Declines HIV: Non-reactive (07/19 0000)   Baby Food                                GBS:   Contraception  Mirena? Pap: HSIL HPV +  CBB     CS/VBAC  HSV+ needs valtrex at 36 weeks [ ]   Support Person           Discussed using arm splints while asleep to help with carpal tunnel symptoms and magnesium supplementation for leg cramps.  Please refer to After Visit Summary for other counseling recommendations.   Return in about 4 weeks (around 12/30/2017) for ROB and GTT/28 week labs.  Marcelyn BruinsJacelyn Porsche Noguchi, CNM 12/02/2017  10:36 AM

## 2017-12-02 NOTE — Progress Notes (Signed)
ROB C/o tingling in hands, and cramping in her legs  Declines flu

## 2017-12-06 ENCOUNTER — Observation Stay
Admission: EM | Admit: 2017-12-06 | Discharge: 2017-12-06 | Disposition: A | Discharge status: Home / Self Care | Payer: Medicaid Other | Attending: Obstetrics & Gynecology | Admitting: Obstetrics & Gynecology

## 2017-12-06 ENCOUNTER — Encounter: Payer: Self-pay | Admitting: Obstetrics and Gynecology

## 2017-12-06 ENCOUNTER — Ambulatory Visit (INDEPENDENT_AMBULATORY_CARE_PROVIDER_SITE_OTHER): Payer: Medicaid Other | Admitting: Obstetrics and Gynecology

## 2017-12-06 ENCOUNTER — Other Ambulatory Visit: Payer: Self-pay

## 2017-12-06 VITALS — BP 110/76 | HR 125

## 2017-12-06 DIAGNOSIS — O99513 Diseases of the respiratory system complicating pregnancy, third trimester: Secondary | ICD-10-CM | POA: Insufficient documentation

## 2017-12-06 DIAGNOSIS — F329 Major depressive disorder, single episode, unspecified: Secondary | ICD-10-CM | POA: Insufficient documentation

## 2017-12-06 DIAGNOSIS — O208 Other hemorrhage in early pregnancy: Secondary | ICD-10-CM

## 2017-12-06 DIAGNOSIS — O99613 Diseases of the digestive system complicating pregnancy, third trimester: Secondary | ICD-10-CM | POA: Diagnosis not present

## 2017-12-06 DIAGNOSIS — Z3A24 24 weeks gestation of pregnancy: Secondary | ICD-10-CM | POA: Insufficient documentation

## 2017-12-06 DIAGNOSIS — O99342 Other mental disorders complicating pregnancy, second trimester: Secondary | ICD-10-CM

## 2017-12-06 DIAGNOSIS — O09522 Supervision of elderly multigravida, second trimester: Secondary | ICD-10-CM

## 2017-12-06 DIAGNOSIS — O26892 Other specified pregnancy related conditions, second trimester: Secondary | ICD-10-CM

## 2017-12-06 DIAGNOSIS — O0972 Supervision of high risk pregnancy due to social problems, second trimester: Secondary | ICD-10-CM

## 2017-12-06 DIAGNOSIS — R110 Nausea: Secondary | ICD-10-CM | POA: Diagnosis not present

## 2017-12-06 DIAGNOSIS — K219 Gastro-esophageal reflux disease without esophagitis: Secondary | ICD-10-CM | POA: Insufficient documentation

## 2017-12-06 DIAGNOSIS — F419 Anxiety disorder, unspecified: Secondary | ICD-10-CM

## 2017-12-06 DIAGNOSIS — Z87891 Personal history of nicotine dependence: Secondary | ICD-10-CM | POA: Insufficient documentation

## 2017-12-06 DIAGNOSIS — O99343 Other mental disorders complicating pregnancy, third trimester: Secondary | ICD-10-CM | POA: Insufficient documentation

## 2017-12-06 DIAGNOSIS — F32A Depression, unspecified: Secondary | ICD-10-CM

## 2017-12-06 DIAGNOSIS — O209 Hemorrhage in early pregnancy, unspecified: Secondary | ICD-10-CM

## 2017-12-06 DIAGNOSIS — F418 Other specified anxiety disorders: Secondary | ICD-10-CM

## 2017-12-06 DIAGNOSIS — J45909 Unspecified asthma, uncomplicated: Secondary | ICD-10-CM | POA: Diagnosis not present

## 2017-12-06 DIAGNOSIS — R1032 Left lower quadrant pain: Secondary | ICD-10-CM | POA: Insufficient documentation

## 2017-12-06 DIAGNOSIS — R109 Unspecified abdominal pain: Secondary | ICD-10-CM

## 2017-12-06 DIAGNOSIS — Z79899 Other long term (current) drug therapy: Secondary | ICD-10-CM | POA: Insufficient documentation

## 2017-12-06 DIAGNOSIS — Z141 Cystic fibrosis carrier: Secondary | ICD-10-CM

## 2017-12-06 DIAGNOSIS — O26899 Other specified pregnancy related conditions, unspecified trimester: Secondary | ICD-10-CM | POA: Diagnosis present

## 2017-12-06 HISTORY — DX: Anxiety disorder, unspecified: F41.9

## 2017-12-06 LAB — CBC
HCT: 33.7 % — ABNORMAL LOW (ref 36.0–46.0)
Hemoglobin: 10.6 g/dL — ABNORMAL LOW (ref 12.0–15.0)
MCH: 31.2 pg (ref 26.0–34.0)
MCHC: 31.5 g/dL (ref 30.0–36.0)
MCV: 99.1 fL (ref 80.0–100.0)
Platelets: 283 10*3/uL (ref 150–400)
RBC: 3.4 MIL/uL — ABNORMAL LOW (ref 3.87–5.11)
RDW: 13.7 % (ref 11.5–15.5)
WBC: 8.2 10*3/uL (ref 4.0–10.5)
nRBC: 0 % (ref 0.0–0.2)

## 2017-12-06 LAB — COMPREHENSIVE METABOLIC PANEL
ALT: 10 U/L (ref 0–44)
AST: 18 U/L (ref 15–41)
Albumin: 3.3 g/dL — ABNORMAL LOW (ref 3.5–5.0)
Alkaline Phosphatase: 50 U/L (ref 38–126)
Anion gap: 9 (ref 5–15)
BUN: 6 mg/dL (ref 6–20)
CO2: 23 mmol/L (ref 22–32)
Calcium: 9.6 mg/dL (ref 8.9–10.3)
Chloride: 106 mmol/L (ref 98–111)
Creatinine, Ser: 0.58 mg/dL (ref 0.44–1.00)
GFR calc Af Amer: >60 mL/min (ref >60)
GFR calc non Af Amer: >60 mL/min (ref >60)
Glucose, Bld: 89 mg/dL (ref 70–99)
Potassium: 3.8 mmol/L (ref 3.5–5.1)
Sodium: 138 mmol/L (ref 135–145)
Total Bilirubin: 0.2 mg/dL — ABNORMAL LOW (ref 0.3–1.2)
Total Protein: 6.5 g/dL (ref 6.5–8.1)

## 2017-12-06 LAB — URINALYSIS, COMPLETE (UACMP) WITH MICROSCOPIC
Bilirubin Urine: NEGATIVE
Glucose, UA: NEGATIVE mg/dL
Hgb urine dipstick: NEGATIVE
Ketones, ur: NEGATIVE mg/dL
Nitrite: NEGATIVE
Protein, ur: NEGATIVE mg/dL
Specific Gravity, Urine: 1.009 (ref 1.005–1.030)
pH: 7 (ref 5.0–8.0)

## 2017-12-06 MED ORDER — CEFAZOLIN SODIUM-DEXTROSE 2-4 GM/100ML-% IV SOLN
2.0000 g | Freq: Once | INTRAVENOUS | Status: AC
  Administered 2017-12-06: 2 g via INTRAVENOUS
  Filled 2017-12-06: qty 100

## 2017-12-06 MED ORDER — ESOMEPRAZOLE MAGNESIUM 20 MG PO CPDR: 20.0000 mg | DELAYED_RELEASE_CAPSULE | Freq: Every day | ORAL | 1 refills | Status: DC

## 2017-12-06 MED ORDER — ONDANSETRON 4 MG PO TBDP: 4.0000 mg | ORAL_TABLET | Freq: Four times a day (QID) | ORAL | 0 refills | Status: DC | PRN

## 2017-12-06 MED ORDER — FAMOTIDINE 20 MG PO TABS
20.0000 mg | ORAL_TABLET | ORAL | Status: AC
  Administered 2017-12-06: 20 mg via ORAL

## 2017-12-06 MED ORDER — NITROFURANTOIN MONOHYD MACRO 100 MG PO CAPS: 100.0000 mg | ORAL_CAPSULE | Freq: Two times a day (BID) | ORAL | 0 refills | Status: DC

## 2017-12-06 MED ORDER — OXYCODONE-ACETAMINOPHEN 5-325 MG PO TABS: 1.0000 | ORAL_TABLET | ORAL | 0 refills | Status: DC | PRN

## 2017-12-06 MED ORDER — ONDANSETRON HCL 4 MG/2ML IJ SOLN
4.0000 mg | Freq: Four times a day (QID) | INTRAMUSCULAR | Status: DC | PRN
  Administered 2017-12-06: 4 mg via INTRAVENOUS
  Filled 2017-12-06 (×2): qty 2

## 2017-12-06 MED ORDER — FAMOTIDINE 20 MG PO TABS
ORAL_TABLET | ORAL | Status: AC
  Administered 2017-12-06: 20 mg via ORAL
  Filled 2017-12-06: qty 1

## 2017-12-06 MED ORDER — LACTATED RINGERS IV SOLN
INTRAVENOUS | Status: DC
  Administered 2017-12-06: 15:00:00 via INTRAVENOUS

## 2017-12-06 MED ORDER — ONDANSETRON 4 MG PO TBDP
4.0000 mg | ORAL_TABLET | ORAL | Status: AC
  Administered 2017-12-06: 4 mg via ORAL

## 2017-12-06 MED ORDER — ONDANSETRON 4 MG PO TBDP
ORAL_TABLET | ORAL | Status: AC
  Filled 2017-12-06: qty 1

## 2017-12-06 MED ORDER — ACETAMINOPHEN 500 MG PO TABS
1000.0000 mg | ORAL_TABLET | Freq: Four times a day (QID) | ORAL | Status: DC | PRN
  Administered 2017-12-06: 1000 mg via ORAL
  Filled 2017-12-06: qty 2

## 2017-12-06 NOTE — OB Triage Note (Addendum)
Pt is a 37 y.o G5P4 at 9724w1d that presents from the ED c/o sharp, intense, stabbing pain in her lower left abdomen that she is rating 3/10 that started around 0300 today. Pt states the pain comes and goes and she cant get comfortable sitting or standing. Last intercourse was 12/03/17. Pt states she has had normal BM and last one was today but when the pain comes she feels the urge to have a BM. She denies VB at this time but states she has had some during this pregnancy. Pt states she has had decreased FM with no CTX, or LOF. Initial FHT 155 with Fetal  Monitors applied and assessing. Pt is writhing in the bed and the baby will not stay on the monitor well. Nurse continues to adjust US. Vitals all WDL.

## 2017-12-06 NOTE — Progress Notes (Signed)
ROB C/o severe nausea and decreased fetal movement

## 2017-12-06 NOTE — Discharge Summary (Signed)
Physician Discharge Summary  Patient ID: Judie Hollick MRN: [id number] DOB/AGE: 06-05-1980 37 y.o.  Admit date: 12/06/2017 Discharge date: 12/06/2017  Admission Diagnoses: [redacted] weeks pregnant, LLQ pain, Nausea  Discharge Diagnoses:  Active Problems:   Abdominal pain affecting pregnancy, antepartum   Nausea   UTI  Discharged Condition: good  Hospital Course: Pt seen and examined, with fetal monitoring reassuring.  Pain not emergent, no signs of internal disorder other than ligament pain and UTI.  Labs w normal WBC, and UA showing signs of UTI.    Consults: None  Significant Diagnostic Studies: FHR tracing reassuring for 24 weeks Results for orders placed or performed during the hospital encounter of 12/06/17  Urinalysis, Complete w Microscopic  Result Value Ref Range   Color, Urine YELLOW (A) YELLOW   APPearance CLOUDY (A) CLEAR   Specific Gravity, Urine 1.009 1.005 - 1.030   pH 7.0 5.0 - 8.0   Glucose, UA NEGATIVE NEGATIVE mg/dL   Hgb urine dipstick NEGATIVE NEGATIVE   Bilirubin Urine NEGATIVE NEGATIVE   Ketones, ur NEGATIVE NEGATIVE mg/dL   Protein, ur NEGATIVE NEGATIVE mg/dL   Nitrite NEGATIVE NEGATIVE   Leukocytes, UA TRACE (A) NEGATIVE   RBC / HPF 0-5 0 - 5 RBC/hpf   WBC, UA 11-20 0 - 5 WBC/hpf   Bacteria, UA RARE (A) NONE SEEN   Squamous Epithelial / LPF 6-10 0 - 5   WBC Clumps PRESENT    Mucus PRESENT    Amorphous Crystal PRESENT   CBC  Result Value Ref Range   WBC 8.2 4.0 - 10.5 K/uL   RBC 3.40 (L) 3.87 - 5.11 MIL/uL   Hemoglobin 10.6 (L) 12.0 - 15.0 g/dL   HCT 56.2 (L) 13.0 - 86.5 %   MCV 99.1 80.0 - 100.0 fL   MCH 31.2 26.0 - 34.0 pg   MCHC 31.5 30.0 - 36.0 g/dL   RDW 78.4 69.6 - 29.5 %   Platelets 283 150 - 400 K/uL   nRBC 0.0 0.0 - 0.2 %  Comprehensive metabolic panel  Result Value Ref Range   Sodium 138 135 - 145 mmol/L   Potassium 3.8 3.5 - 5.1 mmol/L   Chloride 106 98 - 111 mmol/L   CO2 23 22 - 32 mmol/L   Glucose, Bld 89 70 - 99 mg/dL   BUN  6 6 - 20 mg/dL   Creatinine, Ser 2.84 0.44 - 1.00 mg/dL   Calcium 9.6 8.9 - 13.2 mg/dL   Total Protein 6.5 6.5 - 8.1 g/dL   Albumin 3.3 (L) 3.5 - 5.0 g/dL   AST 18 15 - 41 U/L   ALT 10 0 - 44 U/L   Alkaline Phosphatase 50 38 - 126 U/L   Total Bilirubin 0.2 (L) 0.3 - 1.2 mg/dL   GFR calc non Af Amer >60 >60 mL/min   GFR calc Af Amer >60 >60 mL/min   Anion gap 9 5 - 15   Treatments: IV hydration, antibiotics: ceftriaxone and analgesia: acetaminophen  Discharge Exam: Blood pressure (!) 103/55, pulse 87, temperature 98 F (36.7 C), temperature source Oral, resp. rate 16, height 5\' 6"  (1.676 m), weight 88.9 kg, last menstrual period 06/20/2017, SpO2 100 %. General appearance: alert, cooperative and no distress Back: symmetric, no curvature. ROM normal. No CVA tenderness. Resp: clear to auscultation bilaterally Cardio: regular rate and rhythm, S1, S2 normal, no murmur, click, rub or gallop GI: gravid ND no rebound or guading and with mild LLQ T to Palpation;  FHT 140s Pulses:  2+ and symmetric Skin: Skin color, texture, turgor normal. No rashes or lesions Neurologic: Grossly normal  Disposition: Discharge disposition: 01-Home or Self Care      Discharge Instructions    Call MD for:   Complete by:  As directed    Worsening contractions or pain; leakage of fluid; bleeding.   Diet general   Complete by:  As directed    Increase activity slowly   Complete by:  As directed      Allergies as of 12/06/2017   No Known Allergies     Medication List    TAKE these medications   CITRANATAL ASSURE 35-1 & 300 MG tablet Take 2 tablets by mouth daily.   doxylamine (Sleep) 25 MG tablet Commonly known as:  UNISOM Take 1 tablet (25 mg total) by mouth at bedtime.   Doxylamine-Pyridoxine 10-10 MG Tbec Take 2 tablets by mouth at bedtime. If symptoms persist, add one tablet in the morning and one in the afternoon   esomeprazole 20 MG capsule Commonly known as:  NEXIUM Take 1 capsule  (20 mg total) by mouth daily.   nitrofurantoin (macrocrystal-monohydrate) 100 MG capsule Commonly known as:  MACROBID Take 1 capsule (100 mg total) by mouth 2 (two) times daily.   ondansetron 4 MG disintegrating tablet Commonly known as:  ZOFRAN-ODT Take 1 tablet (4 mg total) by mouth every 6 (six) hours as needed for nausea.   oxyCODONE-acetaminophen 5-325 MG tablet Commonly known as:  PERCOCET/ROXICET Take 1 tablet by mouth every 4 (four) hours as needed.   pyridOXINE 25 MG tablet Commonly known as:  VITAMIN B-6 Take 1 tablet (25 mg total) by mouth 4 (four) times daily.   sertraline 50 MG tablet Commonly known as:  ZOLOFT Take 1 tablet (50 mg total) by mouth daily.   TYLENOL PO Take by mouth.       Signed: Letitia Libraobert Paul Khamari Sheehan 12/06/2017, 5:20 PM

## 2017-12-06 NOTE — H&P (Signed)
OB History & Physical   History of Present Illness:  Chief Complaint:   HPI:  Kristen Mcpherson is a 37 y.o. G40P3105 female with EDC=03/26/2017 at [redacted]w[redacted]d dated by LMP and confirmed with 11wk5d ultraound.  Her pregnancy has been complicated by AMA, a history of GDM with a previous pregnancy, anxiety/depression, a HGSIL PAP with +HRHPV, and a history of HSV. Marland Kitchen  She was sent from office for further evaluation of nausea and LLQ pain. She woke at 0300 with urge to urinate, then began having LLQ pain and " I just don't feel well.". The pain is sharp and intermittent and lasts a few seconds. Can't get in a comfortable position. Rates pain 2-3/10. When she has the pain, she feels like she has to have a BM. Sometimes she would have a small BM, and other times she felt the urge, but did not have a BM. Her stools are formed. No diarrhea. No blood in her stools.  Has "awful heart burn and nausea today." Feels like there is something in her throat and she need to vomit. Has been having urinary incontinence with her pregnancy, which seems to have increased over the past week, but denies dysuria, hematuria or a history of kidney stones.  Past history is significant for having an appendectomy after a ruptured appendix when she was 37 years old. No other abdominal surgery. Prenatal care site: P[facility]atal care at [facility] has been remarkable for   [facility] P[facility]atal Labs  Dating LMP c/w 11.5wk ultrasound Blood type: A/Positive/-- (07/19 0000) A +  Genetic Screen NIPS: Normal 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 Antibody: negative  Anatomic [place]  Rubella: Immune (07/19 0000)  Varicella: Immune  GTT Early: 81 Third trimester:  RPR: Nonreactive (07/19 0000)   Rhogam  not needed HBsAg: Negative (07/19 0000)   TDaP vaccine                        Flu Shot: Declines HIV: Non-reactive (07/19 0000)   Baby Food                                GBS:   Contraception  Mirena? Pap: HSIL HPV  +  CBB     CS/VBAC  HSV+ needs valtrex at 36 weeks [ ]   Support Person        Maternal Medical History:   Past Medical History:  Diagnosis Date  . Anxiety   . Asthma     Past Surgical History:  Procedure Laterality Date  . APPENDECTOMY     OB History  Gravida Para Term Preterm AB Living  5 4 3 1   5   SAB TAB Ectopic Multiple Live Births        1 5    # Outcome Date GA Lbr Len/2nd Weight Sex Delivery Anes PTL Lv  5 Current           4A Preterm 10/23/11 [redacted]w[redacted]d  2381 g F Vag-Spont   LIV  4B Preterm 10/23/11 [redacted]w[redacted]d  2977 g M Vag-Spont   LIV     Complications: Heart abnormality  3 Term 10/09/05 [redacted]w[redacted]d  2920 g M Vag-Spont   LIV  2 Term 09/20/03 [redacted]w[redacted]d  2977 g M Vag-Spont   LIV  1 Term 07/04/99 [redacted]w[redacted]d  3572 g M Vag-Spont   LIV   No Known Allergies  Prior to Admission  medications   Medication Sig Start Date End Date Taking? Authorizing Provider  Prenat w/o [id number] (CITRANATAL ASSURE) 35-1 & 300 MG tablet Take 2 tablets by mouth daily. 08/29/17  Yes Schuman, Christanna R, MD  Acetaminophen (TYLENOL PO) Take by mouth.    [provider]  doxylamine, Sleep, (UNISOM) 25 MG tablet Take 1 tablet (25 mg total) by mouth at bedtime. Patient not taking: Reported on 12/02/2017 08/29/17   Natale MilchSchuman, Christanna R, MD  Doxylamine-Pyridoxine (DICLEGIS) 10-10 MG TBEC Take 2 tablets by mouth at bedtime. If symptoms persist, add one tablet in the morning and one in the afternoon Patient not taking: Reported on 12/02/2017 08/30/17   Natale MilchSchuman, Christanna R, MD  pyridOXINE (VITAMIN B-6) 25 MG tablet Take 1 tablet (25 mg total) by mouth 4 (four) times daily. Patient not taking: Reported on 12/02/2017 08/29/17   Natale MilchSchuman, Christanna R, MD  sertraline (ZOLOFT) 50 MG tablet Take 1 tablet (50 mg total) by mouth daily. Patient not taking: Reported on 12/02/2017 08/29/17   Natale MilchSchuman, Christanna R, MD          Social History: She  reports that she has quit smoking. She does not have any smokeless  tobacco history on file. She reports that she drank alcohol. She reports that she does not use drugs.  Family History: family history includes Diabetes in her mother.   Review of Systems: Negative x 10 systems reviewed except as noted in the HPI.      Physical Exam:  Vital Signs: BP (!) 107/59 (BP Location: Left Arm)   Pulse (!) 106   Temp 98.1 F (36.7 C) (Oral)   Resp 16   Ht 5\' 6"  (1.676 m)   Wt 88.9 kg   LMP 06/20/2017 (Exact Date)   SpO2 100%   BMI 31.64 kg/m  General: gravid WF who is moving around in bed frequently, appears uncomfortable HEENT: normocephalic, atraumatic Heart: regular rate & rhythm.  No murmurs/rubs/gallops Lungs: clear to auscultation bilaterally Abdomen: soft abdomen and uterus, bowel sounds active, mild TTP of left lower border of uterus. No guarding. Complained of LLQ pain at one point when the baby moved Pelvic:   External: Normal external female genitalia  Cervix: Dilation: Closed / Effacement (%): Thick /      Extremities: non-tender, symmetric, no edema bilaterally.   Neurologic: Alert & oriented x 3.   Baseline FHR: 150 with accelerations to 160-170 Toco: possible uterine irritability initially (unable to monitor well due to frequent change in position. Bedside Ultrasound:  Initially baby cephalic with body on the left, then baby moved to midline LABS:  Results for orders placed or performed during the hospital encounter of 12/06/17 (from the past 24 hour(s))  Urinalysis, Complete w Microscopic     Status: Abnormal   Collection Time: 12/06/17  2:00 PM  Result Value Ref Range   Color, Urine YELLOW (A) YELLOW   APPearance CLOUDY (A) CLEAR   Specific Gravity, Urine 1.009 1.005 - 1.030   pH 7.0 5.0 - 8.0   Glucose, UA NEGATIVE NEGATIVE mg/dL   Hgb urine dipstick NEGATIVE NEGATIVE   Bilirubin Urine NEGATIVE NEGATIVE   Ketones, ur NEGATIVE NEGATIVE mg/dL   Protein, ur NEGATIVE NEGATIVE mg/dL   Nitrite NEGATIVE NEGATIVE   Leukocytes, UA  TRACE (A) NEGATIVE   RBC / HPF 0-5 0 - 5 RBC/hpf   WBC, UA 11-20 0 - 5 WBC/hpf   Bacteria, UA RARE (A) NONE SEEN   Squamous Epithelial / LPF 6-10 0 - 5  WBC Clumps PRESENT    Mucus PRESENT    Amorphous Crystal PRESENT   CBC     Status: Abnormal   Collection Time: 12/06/17  3:26 PM  Result Value Ref Range   WBC 8.2 4.0 - 10.5 K/uL   RBC 3.40 (L) 3.87 - 5.11 MIL/uL   Hemoglobin 10.6 (L) 12.0 - 15.0 g/dL   HCT 16.1 (L) 09.6 - 04.5 %   MCV 99.1 80.0 - 100.0 fL   MCH 31.2 26.0 - 34.0 pg   MCHC 31.5 30.0 - 36.0 g/dL   RDW 40.9 81.1 - 91.4 %   Platelets 283 150 - 400 K/uL   nRBC 0.0 0.0 - 0.2 %  Comprehensive metabolic panel     Status: Abnormal   Collection Time: 12/06/17  3:26 PM  Result Value Ref Range   Sodium 138 135 - 145 mmol/L   Potassium 3.8 3.5 - 5.1 mmol/L   Chloride 106 98 - 111 mmol/L   CO2 23 22 - 32 mmol/L   Glucose, Bld 89 70 - 99 mg/dL   BUN 6 6 - 20 mg/dL   Creatinine, Ser 7.82 0.44 - 1.00 mg/dL   Calcium 9.6 8.9 - 95.6 mg/dL   Total Protein 6.5 6.5 - 8.1 g/dL   Albumin 3.3 (L) 3.5 - 5.0 g/dL   AST 18 15 - 41 U/L   ALT 10 0 - 44 U/L   Alkaline Phosphatase 50 38 - 126 U/L   Total Bilirubin 0.2 (L) 0.3 - 1.2 mg/dL   GFR calc non Af Amer >60 >60 mL/min   GFR calc Af Amer >60 >60 mL/min   Anion gap 9 5 - 15   Assessment:  Kristen Mcpherson is a 37 y.o. G2P3105 female at [redacted]w[redacted]d with LLQ pain  UA with WBC clumps: possible UTI  Tenderness over left border of uterus-possible left round ligament pain worsened by babies   current position. Nausea and reflux Plan:  1. IV hydration (difficulty getting IV started-IV team consulted). Given Zofran 4 mgm ODT and Pepcid 20 mgm po while awaiting IV start   2. Diet as tolerated (clear liquids now)  3. Urine culture. Ancef IV   Farrel Conners  12/06/2017 3:17 PM

## 2017-12-06 NOTE — Progress Notes (Signed)
Routine Prenatal Care Visit  Subjective  Kristen Mcpherson is a 37 y.o. [id number] at 6374w1d being seen today for ongoing prenatal care.  She is currently monitored for the following issues for this high-risk pregnancy and has History of gestational diabetes in prior pregnancy, currently pregnant in first trimester; AMA (advanced maternal age) multigravida 35+, second trimester; Supervision of high risk pregnancy due to social problems in second trimester; Encounter for genetic screening for Down Syndrome; Anxiety and depression; Vaginal bleeding affecting early pregnancy; Cystic fibrosis carrier; and HSIL on Pap smear of cervix on their problem list.  ----------------------------------------------------------------------------------- Patient reports she woke up this morning at 3am and has been nauseous and not able to get comfortable since then. She feels like she needs to go to the restroom ans have a bowel movement. She goes a small amount and its normal. She feels a lot of pelvic pressure. She denies vaginal discharge. Deneis vaginal bleeding. Denies contractions. Deneis dysuria. Denies hematuria. .   Contractions: Not present. Vag. Bleeding: None.  Movement: Absent. Denies leaking of fluid.  ----------------------------------------------------------------------------------- The following portions of the patient's history were reviewed and updated as appropriate: allergies, current medications, past family history, past medical history, past social history, past surgical history and problem list. Problem list updated.   Objective  Blood pressure 110/76, pulse (!) 125, last menstrual period 06/20/2017. Pregravid weight 168 lb (76.2 kg) Total Weight Gain 32 lb (14.5 kg) Urinalysis:      Fetal Status: Fetal Heart Rate (bpm): 150 Fundal Height: 24 cm Movement: Absent     General:  Alert, oriented and cooperative. Patient is in no acute distress.  Skin: Skin is warm and dry. No rash noted.     Cardiovascular: Normal heart rate noted  Respiratory: Normal respiratory effort, no problems with respiration noted  Abdomen: Soft, gravid, appropriate for gestational age. Pain/Pressure: Present     Pelvic:  Cervical exam deferred        Extremities: Normal range of motion.  Edema: None  Mental Status: Normal mood and affect. Normal behavior. Normal judgment and thought content.     Assessment   37 y.o. [id number] at 7174w1d by  03/27/2018, by Last Menstrual Period presenting for work-in prenatal visit  Plan   Pregnancy #5 Problems (from 06/20/17 to present)    Problem Noted Resolved   Cystic fibrosis carrier 10/07/2017 by Rosalva FerronBaric, Michelle No   Overview Signed 10/07/2017 10:48 AM by Rosalva FerronBaric, Michelle    10/07/17 GC for CF carrier status.  Couple undecided regarding paternal CF carrier screening.       Vaginal bleeding affecting early pregnancy 09/05/2017 by Conard NovakJackson, Stephen D, MD No   AMA (advanced maternal age) multigravida 35+, second trimester 08/29/2017 by Natale MilchSchuman, Christanna R, MD No   Supervision of high risk pregnancy due to social problems in second trimester 08/29/2017 by Natale MilchSchuman, Christanna R, MD No   Overview Addendum 10/07/2017 12:57 PM by Natale MilchSchuman, Christanna R, MD    Clinic [facility] Prenatal Labs  Dating [place] Blood type: A/Positive/-- (07/19 0000) A +  Genetic Screen NIPS: Normal 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 Antibody:   Anatomic US  Rubella: Immune (07/19 0000)  Varicella: Immune  GTT Early: WNLThird trimester:  RPR: Nonreactive (07/19 0000)   Rhogam  not needed HBsAg: Negative (07/19 0000)   TDaP vaccine                        Flu Shot: Declines  HIV: Non-reactive (07/19 0000)   Baby Food                                GBS:   Contraception  Mirena? Pap: HSIL HPV +  CBB     CS/VBAC  HSV+ needs valtrex at 36 weeks [ ]   Support Person              Gestational age appropriate obstetric precautions including but not limited to vaginal  bleeding, contractions, leaking of fluid and fetal movement were reviewed in detail with the patient.    Reassurance about fetal heart tones. Sent to hospital for further evaluation of her LLQ pain.   Return if symptoms worsen or fail to improve.  Natale Milch MD [facility], [facility] 12/06/2017, 1:19 PM

## 2017-12-08 LAB — URINE CULTURE
Culture: 80000 — AB
Special Requests: NORMAL

## 2017-12-09 ENCOUNTER — Other Ambulatory Visit: Payer: Self-pay | Admitting: Certified Nurse Midwife

## 2017-12-09 MED ORDER — CEPHALEXIN 500 MG PO CAPS: 500.0000 mg | ORAL_CAPSULE | Freq: Three times a day (TID) | ORAL | 0 refills | Status: AC

## 2017-12-09 NOTE — Progress Notes (Signed)
Urine culture grew out Strep Viridans. Notified patient will stop Macrobid and start Keflex 500 mgm tid x 7 days.

## 2017-12-23 ENCOUNTER — Telehealth: Payer: Self-pay

## 2017-12-23 NOTE — Telephone Encounter (Signed)
Pt calling c/o  Feeling awful nausea med not working, dizzy, B-H, dry heaves, weak.  Adv to go to L&D via ED as we do not have any openings.  [place] notified.

## 2017-12-30 ENCOUNTER — Other Ambulatory Visit: Payer: Medicaid Other

## 2017-12-30 ENCOUNTER — Encounter: Payer: Self-pay | Admitting: Advanced Practice Midwife

## 2017-12-30 ENCOUNTER — Ambulatory Visit (INDEPENDENT_AMBULATORY_CARE_PROVIDER_SITE_OTHER): Payer: Medicaid Other | Admitting: Advanced Practice Midwife

## 2017-12-30 VITALS — BP 110/62 | Wt 200.0 lb

## 2017-12-30 DIAGNOSIS — O0972 Supervision of high risk pregnancy due to social problems, second trimester: Secondary | ICD-10-CM

## 2017-12-30 DIAGNOSIS — Z3A27 27 weeks gestation of pregnancy: Secondary | ICD-10-CM

## 2017-12-30 LAB — POCT URINALYSIS DIPSTICK OB
Glucose, UA: NEGATIVE
POC,PROTEIN,UA: NEGATIVE

## 2017-12-30 NOTE — Addendum Note (Signed)
Addended by: [place]ORD[place], Tymeir [place] B on: 12/30/2017 09:56 AM   Modules accepted: Orders

## 2017-12-30 NOTE — Patient Instructions (Signed)
Third Trimester of Pregnancy The third trimester is from week 28 through week 40 (months 7 through 9). The third trimester is a time when the unborn baby (fetus) is growing rapidly. At the end of the ninth month, the fetus is about 20 inches in length and weighs 6-10 pounds. Body changes during your third trimester Your body will continue to go through many changes during pregnancy. The changes vary from woman to woman. During the third trimester:  Your weight will continue to increase. You can expect to gain 25-35 pounds (11-16 kg) by the end of the pregnancy.  You may begin to get stretch marks on your hips, abdomen, and breasts.  You may urinate more often because the fetus is moving lower into your pelvis and pressing on your bladder.  You may develop or continue to have heartburn. This is caused by increased hormones that slow down muscles in the digestive tract.  You may develop or continue to have constipation because increased hormones slow digestion and cause the muscles that push waste through your intestines to relax.  You may develop hemorrhoids. These are swollen veins (varicose veins) in the rectum that can itch or be painful.  You may develop swollen, bulging veins (varicose veins) in your legs.  You may have increased body aches in the pelvis, back, or thighs. This is due to weight gain and increased hormones that are relaxing your joints.  You may have changes in your hair. These can include thickening of your hair, rapid growth, and changes in texture. Some women also have hair loss during or after pregnancy, or hair that feels dry or thin. Your hair will most likely return to normal after your baby is born.  Your breasts will continue to grow and they will continue to become tender. A yellow fluid (colostrum) may leak from your breasts. This is the first milk you are producing for your baby.  Your belly button may stick out.  You may notice more swelling in your hands,  face, or ankles.  You may have increased tingling or numbness in your hands, arms, and legs. The skin on your belly may also feel numb.  You may feel short of breath because of your expanding uterus.  You may have more problems sleeping. This can be caused by the size of your belly, increased need to urinate, and an increase in your body's metabolism.  You may notice the fetus "dropping," or moving lower in your abdomen (lightening).  You may have increased vaginal discharge.  You may notice your joints feel loose and you may have pain around your pelvic bone. What to expect at prenatal visits You will have prenatal exams every 2 weeks until week 36. Then you will have weekly prenatal exams. During a routine prenatal visit:  You will be weighed to make sure you and the baby are growing normally.  Your blood pressure will be taken.  Your abdomen will be measured to track your baby's growth.  The fetal heartbeat will be listened to.  Any test results from the previous visit will be discussed.  You may have a cervical check near your due date to see if your cervix has softened or thinned (effaced).  You will be tested for Group B streptococcus. This happens between 35 and 37 weeks. Your health care provider may ask you:  What your birth plan is.  How you are feeling.  If you are feeling the baby move.  If you have had any abnormal   symptoms, such as leaking fluid, bleeding, severe headaches, or abdominal cramping.  If you are using any tobacco products, including cigarettes, chewing tobacco, and electronic cigarettes.  If you have any questions. Other tests or screenings that may be performed during your third trimester include:  Blood tests that check for low iron levels (anemia).  Fetal testing to check the health, activity level, and growth of the fetus. Testing is done if you have certain medical conditions or if there are problems during the pregnancy.  Nonstress test  (NST). This test checks the health of your baby to make sure there are no signs of problems, such as the baby not getting enough oxygen. During this test, a belt is placed around your belly. The baby is made to move, and its heart rate is monitored during movement. What is false labor? False labor is a condition in which you feel small, irregular tightenings of the muscles in the womb (contractions) that usually go away with rest, changing position, or drinking water. These are called Braxton Hicks contractions. Contractions may last for hours, days, or even weeks before true labor sets in. If contractions come at regular intervals, become more frequent, increase in intensity, or become painful, you should see your health care provider. What are the signs of labor?  Abdominal cramps.  Regular contractions that start at 10 minutes apart and become stronger and more frequent with time.  Contractions that start on the top of the uterus and spread down to the lower abdomen and back.  Increased pelvic pressure and dull back pain.  A watery or bloody mucus discharge that comes from the vagina.  Leaking of amniotic fluid. This is also known as your "water breaking." It could be a slow trickle or a gush. Let your health care provider know if it has a color or strange odor. If you have any of these signs, call your health care provider right away, even if it is before your due date. Follow these instructions at home: Medicines  Follow your health care provider's instructions regarding medicine use. Specific medicines may be either safe or unsafe to take during pregnancy.  Take a prenatal vitamin that contains at least 600 micrograms (mcg) of folic acid.  If you develop constipation, try taking a stool softener if your health care provider approves. Eating and drinking   Eat a balanced diet that includes fresh fruits and vegetables, whole grains, good sources of protein such as meat, eggs, or tofu,  and low-fat dairy. Your health care provider will help you determine the amount of weight gain that is right for you.  Avoid raw meat and uncooked cheese. These carry germs that can cause birth defects in the baby.  If you have low calcium intake from food, talk to your health care provider about whether you should take a daily calcium supplement.  Eat four or five small meals rather than three large meals a day.  Limit foods that are high in fat and processed sugars, such as fried and sweet foods.  To prevent constipation: ? Drink enough fluid to keep your urine clear or pale yellow. ? Eat foods that are high in fiber, such as fresh fruits and vegetables, whole grains, and beans. Activity  Exercise only as directed by your health care provider. Most women can continue their usual exercise routine during pregnancy. Try to exercise for 30 minutes at least 5 days a week. Stop exercising if you experience uterine contractions.  Avoid heavy lifting.  Do   not exercise in extreme heat or humidity, or at high altitudes.  Wear low-heel, comfortable shoes.  Practice good posture.  You may continue to have sex unless your health care provider tells you otherwise. Relieving pain and discomfort  Take frequent breaks and rest with your legs elevated if you have leg cramps or low back pain.  Take warm sitz baths to soothe any pain or discomfort caused by hemorrhoids. Use hemorrhoid cream if your health care provider approves.  Wear a good support bra to prevent discomfort from breast tenderness.  If you develop varicose veins: ? Wear support pantyhose or compression stockings as told by your healthcare provider. ? Elevate your feet for 15 minutes, 3-4 times a day. Prenatal care  Write down your questions. Take them to your prenatal visits.  Keep all your prenatal visits as told by your health care provider. This is important. Safety  Wear your seat belt at all times when driving.  Make  a list of emergency phone numbers, including numbers for family, friends, the hospital, and police and fire departments. General instructions  Avoid cat litter boxes and soil used by cats. These carry germs that can cause birth defects in the baby. If you have a cat, ask someone to clean the litter box for you.  Do not travel far distances unless it is absolutely necessary and only with the approval of your health care provider.  Do not use hot tubs, steam rooms, or saunas.  Do not drink alcohol.  Do not use any products that contain nicotine or tobacco, such as cigarettes and e-cigarettes. If you need help quitting, ask your health care provider.  Do not use any medicinal herbs or unprescribed drugs. These chemicals affect the formation and growth of the baby.  Do not douche or use tampons or scented sanitary pads.  Do not cross your legs for long periods of time.  To prepare for the arrival of your baby: ? Take prenatal classes to understand, practice, and ask questions about labor and delivery. ? Make a trial run to the hospital. ? Visit the hospital and tour the maternity area. ? Arrange for maternity or paternity leave through employers. ? Arrange for family and friends to take care of pets while you are in the hospital. ? Purchase a rear-facing car seat and make sure you know how to install it in your car. ? Pack your hospital bag. ? Prepare the baby's nursery. Make sure to remove all pillows and stuffed animals from the baby's crib to prevent suffocation.  Visit your dentist if you have not gone during your pregnancy. Use a soft toothbrush to brush your teeth and be gentle when you floss. Contact a health care provider if:  You are unsure if you are in labor or if your water has broken.  You become dizzy.  You have mild pelvic cramps, pelvic pressure, or nagging pain in your abdominal area.  You have lower back pain.  You have persistent nausea, vomiting, or  diarrhea.  You have an unusual or bad smelling vaginal discharge.  You have pain when you urinate. Get help right away if:  Your water breaks before 37 weeks.  You have regular contractions less than 5 minutes apart before 37 weeks.  You have a fever.  You are leaking fluid from your vagina.  You have spotting or bleeding from your vagina.  You have severe abdominal pain or cramping.  You have rapid weight loss or weight gain.  You have   shortness of breath with chest pain.  You notice sudden or extreme swelling of your face, hands, ankles, feet, or legs.  Your baby makes fewer than 10 movements in 2 hours.  You have severe headaches that do not go away when you take medicine.  You have vision changes. Summary  The third trimester is from week 28 through week 40, months 7 through 9. The third trimester is a time when the unborn baby (fetus) is growing rapidly.  During the third trimester, your discomfort may increase as you and your baby continue to gain weight. You may have abdominal, leg, and back pain, sleeping problems, and an increased need to urinate.  During the third trimester your breasts will keep growing and they will continue to become tender. A yellow fluid (colostrum) may leak from your breasts. This is the first milk you are producing for your baby.  False labor is a condition in which you feel small, irregular tightenings of the muscles in the womb (contractions) that eventually go away. These are called Braxton Hicks contractions. Contractions may last for hours, days, or even weeks before true labor sets in.  Signs of labor can include: abdominal cramps; regular contractions that start at 10 minutes apart and become stronger and more frequent with time; watery or bloody mucus discharge that comes from the vagina; increased pelvic pressure and dull back pain; and leaking of amniotic fluid. This information is not intended to replace advice given to you by your  health care provider. Make sure you discuss any questions you have with your health care provider. Document Released: 12/12/2000 Document Revised: 01/24/2016 Document Reviewed: 01/24/2016 Elsevier Interactive Patient Education  2019 [employer]  

## 2017-12-30 NOTE — Progress Notes (Signed)
ROB 28 week labs Nausea weakness

## 2017-12-30 NOTE — Progress Notes (Signed)
Routine Prenatal Care Visit  Subjective  Kristen Mcpherson is a 37 y.o. [id number] at 1142w4d being seen today for ongoing prenatal care.  She is currently monitored for the following issues for this high-risk pregnancy and has History of gestational diabetes in prior pregnancy, currently pregnant in first trimester; AMA (advanced maternal age) multigravida 35+, second trimester; Supervision of high risk pregnancy due to social problems in second trimester; Encounter for genetic screening for Down Syndrome; Anxiety and depression; Vaginal bleeding affecting early pregnancy; Cystic fibrosis carrier; HSIL on Pap smear of cervix; and Abdominal pain affecting pregnancy, antepartum on their problem list.  ----------------------------------------------------------------------------------- Patient reports feeling nausea, heartburn, shortness of breath, anxiety and generally uncomfortable. She is unable to get comfortable and is not sleeping well. We discussed various comfort measures including unisom/B6 which might help the nausea and the sleep issues. Encouraged adequate hydration and that she consider pregnancy yoga.   Contractions: Not present. Vag. Bleeding: None.  Movement: Present. Denies leaking of fluid.  ----------------------------------------------------------------------------------- The following portions of the patient's history were reviewed and updated as appropriate: allergies, current medications, past family history, past medical history, past social history, past surgical history and problem list. Problem list updated.   Objective  Blood pressure 110/62, weight 200 lb (90.7 kg), last menstrual period 06/20/2017. Pregravid weight 168 lb (76.2 kg) Total Weight Gain 32 lb (14.5 kg) Urinalysis: Urine Protein    Urine Glucose    Fetal Status: Fetal Heart Rate (bpm): 145   Movement: Present     General:  Alert, oriented and cooperative. Patient is in no acute distress.  Skin: Skin is warm and  dry. No rash noted.   Cardiovascular: Normal heart rate noted  Respiratory: Normal respiratory effort, no problems with respiration noted  Abdomen: Soft, gravid, appropriate for gestational age. Pain/Pressure: Present     Pelvic:  Cervical exam deferred        Extremities: Normal range of motion.  Edema: None  Mental Status: Normal mood and affect. Normal behavior. Normal judgment and thought content.   Assessment   37 y.o. [id number] at 4642w4d by  03/27/2018, by Last Menstrual Period presenting for routine prenatal visit  Plan   Pregnancy #5 Problems (from 06/20/17 to present)    Problem Noted Resolved   Cystic fibrosis carrier 10/07/2017 by Rosalva FerronBaric, Michelle No   Overview Signed 10/07/2017 10:48 AM by Rosalva FerronBaric, Michelle    10/07/17 GC for CF carrier status.  Couple undecided regarding paternal CF carrier screening.       Vaginal bleeding affecting early pregnancy 09/05/2017 by Conard NovakJackson, Stephen D, MD No   AMA (advanced maternal age) multigravida 35+, second trimester 08/29/2017 by Natale MilchSchuman, Christanna R, MD No   Supervision of high risk pregnancy due to social problems in second trimester 08/29/2017 by Natale MilchSchuman, Christanna R, MD No   Overview Addendum 12/06/2017  3:38 PM by Farrel ConnersGutierrez, Colleen, CNM    Clinic [facility] Prenatal Labs  Dating [place] Blood type: A/Positive/-- (07/19 0000) A +  Genetic Screen NIPS: Normal 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 Antibody:   Anatomic US  Rubella: Immune (07/19 0000)  Varicella: Immune  GTT Early: WNLThird trimester:  RPR: Nonreactive (07/19 0000)   Rhogam  not needed HBsAg: Negative (07/19 0000)   TDaP vaccine                        Flu Shot: Declines HIV: Non-reactive (07/19 0000)   Baby Food  GBS:   Contraception  Mirena? Pap: HSIL HPV +  CBB     CS/VBAC  HSV+ needs valtrex at 36 weeks [ ]   Support Person              Preterm labor symptoms and general obstetric precautions including but not  limited to vaginal bleeding, contractions, leaking of fluid and fetal movement were reviewed in detail with the patient. Please refer to After Visit Summary for other counseling recommendations.   Return in about 2 weeks (around 01/13/2018) for rob.  Kristen Mcpherson, CNM 12/30/2017 9:44 AM

## 2017-12-31 ENCOUNTER — Other Ambulatory Visit: Payer: Self-pay | Admitting: Maternal Newborn

## 2017-12-31 DIAGNOSIS — R7309 Other abnormal glucose: Secondary | ICD-10-CM

## 2017-12-31 DIAGNOSIS — O99013 Anemia complicating pregnancy, third trimester: Secondary | ICD-10-CM

## 2017-12-31 LAB — 28 WEEK RH+PANEL
Basophils Absolute: 0 10*3/uL (ref 0.0–0.2)
Basos: 1 %
EOS (ABSOLUTE): 0.2 10*3/uL (ref 0.0–0.4)
Eos: 3 %
Gestational Diabetes Screen: 151 mg/dL — ABNORMAL HIGH (ref 65–139)
HIV Screen 4th Generation wRfx: NONREACTIVE
Hematocrit: 28.4 % — ABNORMAL LOW (ref 34.0–46.6)
Hemoglobin: 9.5 g/dL — ABNORMAL LOW (ref 11.1–15.9)
Immature Grans (Abs): 0 10*3/uL (ref 0.0–0.1)
Immature Granulocytes: 0 %
Lymphocytes Absolute: 1.4 10*3/uL (ref 0.7–3.1)
Lymphs: 20 %
MCH: 31.3 pg (ref 26.6–33.0)
MCHC: 33.5 g/dL (ref 31.5–35.7)
MCV: 93 fL (ref 79–97)
Monocytes Absolute: 0.5 10*3/uL (ref 0.1–0.9)
Monocytes: 7 %
Neutrophils Absolute: 4.8 10*3/uL (ref 1.4–7.0)
Neutrophils: 69 %
Platelets: 284 10*3/uL (ref 150–450)
RBC: 3.04 x10E6/uL — ABNORMAL LOW (ref 3.77–5.28)
RDW: 12.7 % (ref 12.3–15.4)
RPR Ser Ql: NONREACTIVE
WBC: 6.9 10*3/uL (ref 3.4–10.8)

## 2017-12-31 MED ORDER — FERROUS SULFATE 325 (65 FE) MG PO TABS: 325.0000 mg | ORAL_TABLET | Freq: Two times a day (BID) | ORAL | 1 refills | Status: DC

## 2017-12-31 NOTE — Progress Notes (Signed)
3 hour GTT ordered and Rx sent for iron. Notified through MyChart of lab results.

## 2018-01-01 ENCOUNTER — Observation Stay
Admission: EM | Admit: 2018-01-01 | Discharge: 2018-01-01 | Disposition: A | Discharge status: Home / Self Care | Payer: Medicaid Other | Attending: Obstetrics and Gynecology | Admitting: Obstetrics and Gynecology

## 2018-01-01 ENCOUNTER — Other Ambulatory Visit: Payer: Self-pay

## 2018-01-01 DIAGNOSIS — Z141 Cystic fibrosis carrier: Secondary | ICD-10-CM

## 2018-01-01 DIAGNOSIS — O0992 Supervision of high risk pregnancy, unspecified, second trimester: Secondary | ICD-10-CM | POA: Diagnosis not present

## 2018-01-01 DIAGNOSIS — R110 Nausea: Secondary | ICD-10-CM | POA: Diagnosis present

## 2018-01-01 DIAGNOSIS — O0972 Supervision of high risk pregnancy due to social problems, second trimester: Secondary | ICD-10-CM

## 2018-01-01 DIAGNOSIS — Z3A27 27 weeks gestation of pregnancy: Secondary | ICD-10-CM | POA: Insufficient documentation

## 2018-01-01 DIAGNOSIS — Z87891 Personal history of nicotine dependence: Secondary | ICD-10-CM | POA: Diagnosis not present

## 2018-01-01 DIAGNOSIS — O26892 Other specified pregnancy related conditions, second trimester: Secondary | ICD-10-CM | POA: Diagnosis present

## 2018-01-01 DIAGNOSIS — O208 Other hemorrhage in early pregnancy: Secondary | ICD-10-CM

## 2018-01-01 DIAGNOSIS — O09522 Supervision of elderly multigravida, second trimester: Secondary | ICD-10-CM

## 2018-01-01 DIAGNOSIS — O209 Hemorrhage in early pregnancy, unspecified: Secondary | ICD-10-CM

## 2018-01-01 DIAGNOSIS — O99012 Anemia complicating pregnancy, second trimester: Secondary | ICD-10-CM | POA: Insufficient documentation

## 2018-01-01 DIAGNOSIS — R51 Headache: Secondary | ICD-10-CM | POA: Diagnosis present

## 2018-01-01 LAB — BASIC METABOLIC PANEL
Anion gap: 5 (ref 5–15)
BUN: 8 mg/dL (ref 6–20)
CO2: 22 mmol/L (ref 22–32)
Calcium: 8.3 mg/dL — ABNORMAL LOW (ref 8.9–10.3)
Chloride: 107 mmol/L (ref 98–111)
Creatinine, Ser: 0.49 mg/dL (ref 0.44–1.00)
GFR calc Af Amer: >60 mL/min (ref >60)
GFR calc non Af Amer: >60 mL/min (ref >60)
Glucose, Bld: 87 mg/dL (ref 70–99)
Potassium: 3.6 mmol/L (ref 3.5–5.1)
Sodium: 134 mmol/L — ABNORMAL LOW (ref 135–145)

## 2018-01-01 LAB — CBC
HCT: 29.9 % — ABNORMAL LOW (ref 36.0–46.0)
HCT: 26.5 % — ABNORMAL LOW (ref 36.0–46.0)
Hemoglobin: 9.7 g/dL — ABNORMAL LOW (ref 12.0–15.0)
Hemoglobin: 8.4 g/dL — ABNORMAL LOW (ref 12.0–15.0)
MCH: 31.1 pg (ref 26.0–34.0)
MCH: 30.8 pg (ref 26.0–34.0)
MCHC: 32.4 g/dL (ref 30.0–36.0)
MCHC: 31.7 g/dL (ref 30.0–36.0)
MCV: 95.8 fL (ref 80.0–100.0)
MCV: 97.1 fL (ref 80.0–100.0)
Platelets: 283 10*3/uL (ref 150–400)
Platelets: 237 10*3/uL (ref 150–400)
RBC: 3.12 MIL/uL — ABNORMAL LOW (ref 3.87–5.11)
RBC: 2.73 MIL/uL — ABNORMAL LOW (ref 3.87–5.11)
RDW: 13.2 % (ref 11.5–15.5)
RDW: 13.4 % (ref 11.5–15.5)
WBC: 7.1 10*3/uL (ref 4.0–10.5)
WBC: 5.5 10*3/uL (ref 4.0–10.5)
nRBC: 0 % (ref 0.0–0.2)
nRBC: 0 % (ref 0.0–0.2)

## 2018-01-01 LAB — SAMPLE TO BLOOD BANK

## 2018-01-01 LAB — GLUCOSE, CAPILLARY: Glucose-Capillary: 80 mg/dL (ref 70–99)

## 2018-01-01 MED ORDER — DEXTROSE IN LACTATED RINGERS 5 % IV SOLN
INTRAVENOUS | Status: DC
  Administered 2018-01-01: 03:00:00 via INTRAVENOUS

## 2018-01-01 MED ORDER — FERROUS SULFATE 325 (65 FE) MG PO TABS: 325.0000 mg | ORAL_TABLET | Freq: Two times a day (BID) | ORAL | 1 refills | Status: DC

## 2018-01-01 MED ORDER — SODIUM CHLORIDE 0.9 % IV BOLUS
1000.0000 mL | Freq: Once | INTRAVENOUS | Status: AC
  Administered 2018-01-01: 1000 mL via INTRAVENOUS

## 2018-01-01 MED ORDER — ONDANSETRON HCL 4 MG/2ML IJ SOLN
4.0000 mg | Freq: Three times a day (TID) | INTRAMUSCULAR | Status: DC | PRN
  Filled 2018-01-01: qty 2

## 2018-01-01 MED ORDER — BUTALBITAL-APAP-CAFFEINE 50-325-40 MG PO TABS
1.0000 | ORAL_TABLET | ORAL | Status: DC | PRN
  Administered 2018-01-01: 1 via ORAL
  Filled 2018-01-01: qty 1

## 2018-01-01 MED ORDER — PROMETHAZINE HCL 25 MG PO TABS
25.0000 mg | ORAL_TABLET | Freq: Four times a day (QID) | ORAL | Status: DC | PRN
  Administered 2018-01-01: 25 mg via ORAL
  Filled 2018-01-01: qty 1

## 2018-01-01 MED ORDER — POTASSIUM CHLORIDE CRYS ER 10 MEQ PO TBCR
40.0000 meq | EXTENDED_RELEASE_TABLET | Freq: Once | ORAL | Status: AC
  Administered 2018-01-01: 40 meq via ORAL
  Filled 2018-01-01: qty 4

## 2018-01-01 MED ORDER — BUTALBITAL-APAP-CAFFEINE 50-325-40 MG PO TABS: 1.0000 | ORAL_TABLET | Freq: Four times a day (QID) | ORAL | 0 refills | Status: DC | PRN

## 2018-01-01 MED ORDER — LACTATED RINGERS IV BOLUS
1000.0000 mL | Freq: Once | INTRAVENOUS | Status: AC
  Administered 2018-01-01: 1000 mL via INTRAVENOUS

## 2018-01-01 MED ORDER — METOCLOPRAMIDE HCL 5 MG/ML IJ SOLN
10.0000 mg | Freq: Four times a day (QID) | INTRAMUSCULAR | Status: DC | PRN
  Administered 2018-01-01: 10 mg via INTRAVENOUS
  Filled 2018-01-01 (×3): qty 2

## 2018-01-01 MED ORDER — MORPHINE SULFATE (PF) 2 MG/ML IV SOLN
1.0000 mg | INTRAVENOUS | Status: DC | PRN
  Administered 2018-01-01: 1 mg via INTRAVENOUS
  Filled 2018-01-01: qty 1

## 2018-01-01 MED ORDER — ACETAMINOPHEN 500 MG PO TABS
1000.0000 mg | ORAL_TABLET | Freq: Four times a day (QID) | ORAL | Status: DC | PRN
  Administered 2018-01-01: 1000 mg via ORAL
  Filled 2018-01-01: qty 2

## 2018-01-01 NOTE — H&P (Signed)
Triage note  Kristen Mcpherson is an 38 y.o. female.  HPI: She presented to labor and delivery today with complaints of fatigue, leg cramps and nausea. She reports that she has only been eating ice today to nursing. She reported syncopal episodes. She says sometimes she feels fine but then she will suddenly feel awful and not able to function.   Past Medical History:  Diagnosis Date  . Anxiety   . Asthma     Past Surgical History:  Procedure Laterality Date  . APPENDECTOMY      Family History  Problem Relation Age of Onset  . Diabetes Mother     Social History:  reports that she has quit smoking. She has never used smokeless tobacco. She reports previous alcohol use. She reports that she does not use drugs.  Allergies: No Known Allergies  Medications: I have reviewed the patient's current medications.  Results for orders placed or performed during the hospital encounter of 01/01/18 (from the past 48 hour(s))  Glucose, capillary     Status: None   Collection Time: 01/01/18 12:48 AM  Result Value Ref Range   Glucose-Capillary 80 70 - 99 mg/dL    No results found.  Review of Systems  Constitutional: Negative for chills, fever, malaise/fatigue and weight loss.  HENT: Negative for congestion, hearing loss and sinus pain.   Eyes: Negative for blurred vision and double vision.  Respiratory: Negative for cough, sputum production, shortness of breath and wheezing.   Cardiovascular: Negative for chest pain, palpitations, orthopnea and leg swelling.  Gastrointestinal: Negative for abdominal pain, constipation, diarrhea, nausea and vomiting.  Genitourinary: Negative for dysuria, flank pain, frequency, hematuria and urgency.  Musculoskeletal: Negative for back pain, falls and joint pain.  Skin: Negative for itching and rash.  Neurological: Negative for dizziness and headaches.  Psychiatric/Behavioral: Negative for depression, substance abuse and suicidal ideas. The patient is not  nervous/anxious.    Blood pressure (!) 91/40, pulse 74, temperature 98.3 F (36.8 C), temperature source Oral, last menstrual period 06/20/2017. Physical Exam  Nursing note and vitals reviewed. Constitutional: She is oriented to person, place, and time. She appears well-developed and well-nourished.  HENT:  Head: Normocephalic and atraumatic.  Cardiovascular: Normal rate and regular rhythm.  Respiratory: Effort normal and breath sounds normal.  GI: Soft. Bowel sounds are normal.  Musculoskeletal: Normal range of motion.  Neurological: She is alert and oriented to person, place, and time.  Skin: Skin is warm and dry.  Psychiatric: She has a normal mood and affect. Her behavior is normal. Judgment and thought content normal.    Assessment/Plan:  38 yo G5P3105 with complaints of fatigue, malaise, and pain IV Fluid hydration CBC BMP   Update: Kristen Mcpherson was observed overnight and hydrated with IV fluids. Discussed anemia, plans made to follow up with hematology outpatient.   Dequavius Kuhner R Jatavius Ellenwood 01/01/2018, 1:21 AM

## 2018-01-01 NOTE — OB Triage Note (Signed)
Pt presents from ED with complaints of nausea, leg pain and weakness. Denies decreased fetal movement, V/D, bleeding. Endorses intercourse in the last 24 hours.

## 2018-01-01 NOTE — Final Progress Note (Signed)
Physician Final Progress Note  Patient ID: Kristen Mcpherson MRN: [id number] DOB/AGE: 1980/07/02 38 y.o.  Admit date: 01/01/2018 Admitting provider: Natale Milch, MD Discharge date: 01/01/2018   Admission Diagnoses: Headache, nausea, leg cramps  Discharge Diagnoses:  Supervision of high risk pregnancy  History of Present Illness: The patient is a 38 y.o. female [id number] at [redacted]w[redacted]d who presented to triage early this morning for nausea, headache and leg cramps. She received IV hydration, oral potassium, Reglan, and Fioricet. She was able to tolerate a regular diet and ate about 75% of her meal. Fioricet helped improve her headache, although she does still have a headache that she rates 2/10. Of note, she has anemia and was counseled to take iron supplements twice daily with food.  Review of Systems: Review of systems negative unless otherwise noted in HPI.   Past Medical History:  Diagnosis Date  . Anxiety   . Asthma     Past Surgical History:  Procedure Laterality Date  . APPENDECTOMY      No current facility-administered medications on file prior to encounter.    Current Outpatient Medications on File Prior to Encounter  Medication Sig Dispense Refill  . esomeprazole (NEXIUM) 20 MG capsule Take 1 capsule (20 mg total) by mouth daily. 30 capsule 1  . ondansetron (ZOFRAN ODT) 4 MG disintegrating tablet Take 1 tablet (4 mg total) by mouth every 6 (six) hours as needed for nausea. 20 tablet 0  . Doxylamine-Pyridoxine (DICLEGIS) 10-10 MG TBEC Take 2 tablets by mouth at bedtime. If symptoms persist, add one tablet in the morning and one in the afternoon (Patient not taking: Reported on 12/02/2017) 100 tablet 5  . Prenat w/o A-FeCbGl-DSS-FA-DHA (CITRANATAL ASSURE) 35-1 & 300 MG tablet Take 2 tablets by mouth daily. 60 tablet 11  . sertraline (ZOLOFT) 50 MG tablet Take 1 tablet (50 mg total) by mouth daily. (Patient not taking: Reported on 12/02/2017) 30 tablet 11    No Known  Allergies  Social History   Socioeconomic History  . Marital status: Single    Spouse name: Not on file  . Number of children: Not on file  . Years of education: Not on file  . Highest education level: Not on file  Occupational History  . Not on file  Social Needs  . Financial resource strain: Not on file  . Food insecurity:    Worry: Not on file    Inability: Not on file  . Transportation needs:    Medical: Not on file    Non-medical: Not on file  Tobacco Use  . Smoking status: Former Games developer  . Smokeless tobacco: Never Used  Substance and Sexual Activity  . Alcohol use: Not Currently  . Drug use: Never  . Sexual activity: Yes    Birth control/protection: Surgical    Comment: BTL  Lifestyle  . Physical activity:    Days per week: Not on file    Minutes per session: Not on file  . Stress: Not on file  Relationships  . Social connections:    Talks on phone: Not on file    Gets together: Not on file    Attends religious service: Not on file    Active member of club or organization: Not on file    Attends meetings of clubs or organizations: Not on file    Relationship status: Not on file  . Intimate partner violence:    Fear of current or ex partner: No    Emotionally abused: No  Physically abused: No    Forced sexual activity: No  Other Topics Concern  . Not on file  Social History Narrative  . Not on file    Family history:  Family History  Problem Relation Age of Onset  . Diabetes Mother     Physical Exam: BP (!) 100/40   Pulse 84   Temp 98 F (36.7 C) (Oral)   Resp 20   Ht 5\' 6"  (1.676 m)   Wt 90.7 kg   LMP 06/20/2017 (Exact Date)   SpO2 98%   BMI 32.28 kg/m   Gen: Fatigued, conversant CV: Regular rate Pulm: No increased work of breathing Pelvic: Deferred Ext: No signs of DVT  Consults: None  Procedures: Reactive NST overnight  Discharge Condition: stable  Disposition: Discharge home  Diet: Regular diet  Discharge Activity:  Activity as tolerated  Discharge Instructions    Discharge activity:  No Restrictions   Complete by:  As directed    Discharge diet:  No restrictions   Complete by:  As directed    No sexual activity restrictions   Complete by:  As directed    Notify physician for a general feeling that "something is not right"   Complete by:  As directed    Notify physician for increase or change in vaginal discharge   Complete by:  As directed    Notify physician for intestinal cramps, with or without diarrhea, sometimes described as "gas pain"   Complete by:  As directed    Notify physician for leaking of fluid   Complete by:  As directed    Notify physician for low, dull backache, unrelieved by heat or Tylenol   Complete by:  As directed    Notify physician for menstrual like cramps   Complete by:  As directed    Notify physician for pelvic pressure   Complete by:  As directed    Notify physician for uterine contractions.  These may be painless and feel like the uterus is tightening or the baby is  "balling up"   Complete by:  As directed    Notify physician for vaginal bleeding   Complete by:  As directed    PRETERM LABOR:  Includes any of the follwing symptoms that occur between 20 - [redacted] weeks gestation.  If these symptoms are not stopped, preterm labor can result in preterm delivery, placing your baby at risk   Complete by:  As directed      Allergies as of 01/01/2018   No Known Allergies     Medication List    STOP taking these medications   doxylamine (Sleep) 25 MG tablet Commonly known as:  UNISOM   oxyCODONE-acetaminophen 5-325 MG tablet Commonly known as:  PERCOCET   pyridOXINE 25 MG tablet Commonly known as:  VITAMIN B-6   TYLENOL PO     TAKE these medications   butalbital-acetaminophen-caffeine 50-325-40 MG tablet Commonly known as:  FIORICET, ESGIC Take 1-2 tablets by mouth every 6 (six) hours as needed for headache.   CITRANATAL ASSURE 35-1 & 300 MG tablet Take 2  tablets by mouth daily.   Doxylamine-Pyridoxine 10-10 MG Tbec Commonly known as:  DICLEGIS Take 2 tablets by mouth at bedtime. If symptoms persist, add one tablet in the morning and one in the afternoon   esomeprazole 20 MG capsule Commonly known as:  NEXIUM Take 1 capsule (20 mg total) by mouth daily.   ferrous sulfate 325 (65 FE) MG tablet Commonly known as:  [employer]  Take 1 tablet (325 mg total) by mouth 2 (two) times daily.   ondansetron 4 MG disintegrating tablet Commonly known as:  ZOFRAN ODT Take 1 tablet (4 mg total) by mouth every 6 (six) hours as needed for nausea.   sertraline 50 MG tablet Commonly known as:  ZOLOFT Take 1 tablet (50 mg total) by mouth daily.      Encouraged rest and bland foods. Iron supplements and Fioricet sent to [facility]. Keep scheduled ROB appointment and lab visit for 3 hour GTT.  Signed: Oswaldo ConroyJacelyn Y Pierina Schuknecht, CNM  01/01/2018

## 2018-01-01 NOTE — Discharge Summary (Signed)
See Final Progress Note 01/01/2018. 

## 2018-01-03 ENCOUNTER — Other Ambulatory Visit: Payer: Self-pay

## 2018-01-03 ENCOUNTER — Observation Stay
Admission: EM | Admit: 2018-01-03 | Discharge: 2018-01-04 | Disposition: A | Discharge status: Home / Self Care | Payer: Medicaid Other | Attending: Obstetrics and Gynecology | Admitting: Obstetrics and Gynecology

## 2018-01-03 ENCOUNTER — Other Ambulatory Visit: Payer: Self-pay | Admitting: Obstetrics and Gynecology

## 2018-01-03 DIAGNOSIS — Z3A24 24 weeks gestation of pregnancy: Secondary | ICD-10-CM | POA: Diagnosis not present

## 2018-01-03 DIAGNOSIS — O26893 Other specified pregnancy related conditions, third trimester: Secondary | ICD-10-CM | POA: Insufficient documentation

## 2018-01-03 DIAGNOSIS — D649 Anemia, unspecified: Secondary | ICD-10-CM | POA: Insufficient documentation

## 2018-01-03 DIAGNOSIS — O99013 Anemia complicating pregnancy, third trimester: Principal | ICD-10-CM | POA: Insufficient documentation

## 2018-01-03 DIAGNOSIS — Z79899 Other long term (current) drug therapy: Secondary | ICD-10-CM | POA: Diagnosis not present

## 2018-01-03 DIAGNOSIS — Z87891 Personal history of nicotine dependence: Secondary | ICD-10-CM | POA: Diagnosis not present

## 2018-01-03 DIAGNOSIS — J45909 Unspecified asthma, uncomplicated: Secondary | ICD-10-CM | POA: Insufficient documentation

## 2018-01-03 DIAGNOSIS — O99513 Diseases of the respiratory system complicating pregnancy, third trimester: Secondary | ICD-10-CM | POA: Diagnosis not present

## 2018-01-03 DIAGNOSIS — O99019 Anemia complicating pregnancy, unspecified trimester: Principal | ICD-10-CM

## 2018-01-03 DIAGNOSIS — E860 Dehydration: Secondary | ICD-10-CM | POA: Insufficient documentation

## 2018-01-03 DIAGNOSIS — I959 Hypotension, unspecified: Secondary | ICD-10-CM | POA: Diagnosis not present

## 2018-01-03 DIAGNOSIS — O99343 Other mental disorders complicating pregnancy, third trimester: Secondary | ICD-10-CM | POA: Diagnosis not present

## 2018-01-03 DIAGNOSIS — F419 Anxiety disorder, unspecified: Secondary | ICD-10-CM | POA: Insufficient documentation

## 2018-01-03 DIAGNOSIS — Z141 Cystic fibrosis carrier: Secondary | ICD-10-CM

## 2018-01-03 DIAGNOSIS — O99283 Endocrine, nutritional and metabolic diseases complicating pregnancy, third trimester: Secondary | ICD-10-CM | POA: Insufficient documentation

## 2018-01-03 DIAGNOSIS — R55 Syncope and collapse: Secondary | ICD-10-CM | POA: Insufficient documentation

## 2018-01-03 DIAGNOSIS — O209 Hemorrhage in early pregnancy, unspecified: Secondary | ICD-10-CM

## 2018-01-03 DIAGNOSIS — O0940 Supervision of pregnancy with grand multiparity, unspecified trimester: Secondary | ICD-10-CM

## 2018-01-03 DIAGNOSIS — F329 Major depressive disorder, single episode, unspecified: Secondary | ICD-10-CM | POA: Insufficient documentation

## 2018-01-03 DIAGNOSIS — O208 Other hemorrhage in early pregnancy: Secondary | ICD-10-CM

## 2018-01-03 DIAGNOSIS — O0972 Supervision of high risk pregnancy due to social problems, second trimester: Secondary | ICD-10-CM

## 2018-01-03 DIAGNOSIS — O2653 Maternal hypotension syndrome, third trimester: Secondary | ICD-10-CM

## 2018-01-03 DIAGNOSIS — O09523 Supervision of elderly multigravida, third trimester: Secondary | ICD-10-CM | POA: Diagnosis not present

## 2018-01-03 DIAGNOSIS — Z3A28 28 weeks gestation of pregnancy: Secondary | ICD-10-CM | POA: Diagnosis not present

## 2018-01-03 DIAGNOSIS — O09522 Supervision of elderly multigravida, second trimester: Secondary | ICD-10-CM

## 2018-01-03 LAB — CBC
HCT: 30.2 % — ABNORMAL LOW (ref 36.0–46.0)
Hemoglobin: 9.7 g/dL — ABNORMAL LOW (ref 12.0–15.0)
MCH: 30.9 pg (ref 26.0–34.0)
MCHC: 32.1 g/dL (ref 30.0–36.0)
MCV: 96.2 fL (ref 80.0–100.0)
Platelets: 291 10*3/uL (ref 150–400)
RBC: 3.14 MIL/uL — ABNORMAL LOW (ref 3.87–5.11)
RDW: 13.2 % (ref 11.5–15.5)
WBC: 8.2 10*3/uL (ref 4.0–10.5)
nRBC: 0 % (ref 0.0–0.2)

## 2018-01-03 LAB — FERRITIN: Ferritin: 11 ng/mL (ref 11–307)

## 2018-01-03 MED ORDER — SODIUM CHLORIDE 0.9% IV SOLUTION
Freq: Once | INTRAVENOUS | Status: AC
  Administered 2018-01-04: 02:00:00 via INTRAVENOUS

## 2018-01-03 MED ORDER — FERROUS SULFATE 325 (65 FE) MG PO TABS
325.0000 mg | ORAL_TABLET | Freq: Two times a day (BID) | ORAL | Status: DC
  Administered 2018-01-04: 325 mg via ORAL
  Filled 2018-01-03: qty 1

## 2018-01-03 MED ORDER — PANTOPRAZOLE SODIUM 40 MG PO TBEC
40.0000 mg | DELAYED_RELEASE_TABLET | Freq: Every day | ORAL | Status: DC
  Administered 2018-01-04: 40 mg via ORAL
  Filled 2018-01-03 (×2): qty 1

## 2018-01-03 MED ORDER — SERTRALINE HCL 25 MG PO TABS
50.0000 mg | ORAL_TABLET | Freq: Every day | ORAL | Status: DC
  Filled 2018-01-03: qty 2
  Filled 2018-01-03: qty 1

## 2018-01-03 MED ORDER — ACETAMINOPHEN 500 MG PO TABS
1000.0000 mg | ORAL_TABLET | Freq: Once | ORAL | Status: AC | PRN
  Administered 2018-01-03: 1000 mg via ORAL

## 2018-01-03 MED ORDER — SODIUM CHLORIDE 0.9 % IV BOLUS
1000.0000 mL | Freq: Two times a day (BID) | INTRAVENOUS | Status: DC
  Administered 2018-01-03: 1000 mL via INTRAVENOUS

## 2018-01-03 MED ORDER — ONDANSETRON HCL 4 MG PO TABS: 4.0000 mg | ORAL_TABLET | Freq: Four times a day (QID) | ORAL | Status: DC | PRN

## 2018-01-03 MED ORDER — LACTATED RINGERS IV SOLN
INTRAVENOUS | Status: DC
  Administered 2018-01-03: 900 mL via INTRAVENOUS

## 2018-01-03 MED ORDER — ZOLPIDEM TARTRATE 5 MG PO TABS
5.0000 mg | ORAL_TABLET | Freq: Every evening | ORAL | Status: DC | PRN
  Administered 2018-01-04: 5 mg via ORAL
  Filled 2018-01-03: qty 1

## 2018-01-03 MED ORDER — ACETAMINOPHEN 500 MG PO TABS
ORAL_TABLET | ORAL | Status: AC
  Administered 2018-01-03: 1000 mg via ORAL
  Filled 2018-01-03: qty 2

## 2018-01-03 MED ORDER — ADULT MULTIVITAMIN W/MINERALS CH
1.0000 | ORAL_TABLET | Freq: Every day | ORAL | Status: DC
  Filled 2018-01-03: qty 1

## 2018-01-03 MED ORDER — BUTALBITAL-APAP-CAFFEINE 50-325-40 MG PO TABS: 1.0000 | ORAL_TABLET | Freq: Four times a day (QID) | ORAL | Status: DC | PRN

## 2018-01-03 MED ORDER — DOCUSATE SODIUM 100 MG PO CAPS
100.0000 mg | ORAL_CAPSULE | Freq: Two times a day (BID) | ORAL | Status: DC
  Filled 2018-01-03: qty 1

## 2018-01-03 MED ORDER — PRENATAL MULTIVITAMIN CH
1.0000 | ORAL_TABLET | Freq: Every day | ORAL | Status: DC
  Administered 2018-01-04: 1 via ORAL
  Filled 2018-01-03: qty 1

## 2018-01-03 MED ORDER — ONDANSETRON HCL 4 MG/2ML IJ SOLN
4.0000 mg | Freq: Four times a day (QID) | INTRAMUSCULAR | Status: DC | PRN
  Administered 2018-01-04: 4 mg via INTRAVENOUS
  Filled 2018-01-03: qty 2

## 2018-01-03 MED ORDER — LACTATED RINGERS IV SOLN: INTRAVENOUS | Status: DC

## 2018-01-03 MED ORDER — THIAMINE HCL 100 MG/ML IJ SOLN
Freq: Once | INTRAVENOUS | Status: AC
  Administered 2018-01-04: 04:00:00 via INTRAVENOUS
  Filled 2018-01-03: qty 1000

## 2018-01-03 NOTE — H&P (Signed)
H&P  Kristen Mcpherson is an 38 y.o. female.  HPI: She presents to triage today after I spoke with her on the phone. I was calling her about a referral to hematology for anemia in pregnancy. She told me that she has been essentially bed bound because she feels awful standing or walking. She reported that she had gone to the store and had an episode where she "blacked out" and fainted. Her husband caught her. She has multiple complaints of headache, nausea, fatigue and pain in her legs.   She has had low blood pressure in triage but her orthostatics are stable.     She has an appointment on 01/08/17 to be seen by hematology.   She feels fetal movement. She denies contraction, vaginal bleeding or leakage of fluid.  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  Couple undecided regarding paternal CF carrier screening.       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 Antibody:   Anatomic US  Rubella: Immune (07/19 0000)  Varicella: Immune  GTT Early: WNLThird trimester:  RPR: Nonreactive (07/19 0000)   Rhogam  not needed HBsAg: Negative (07/19 0000)   TDaP vaccine                        Flu Shot: Declines  HIV: Non-reactive (07/19 0000)   Baby Food                                GBS:   Contraception  Mirena? Pap: HSIL HPV +  CBB     CS/VBAC  HSV+ needs valtrex at 36 weeks [ ]   Support Person              Past Medical History:  Diagnosis Date  . Anxiety   . Asthma     Past Surgical History:  Procedure Laterality Date  . APPENDECTOMY      Family History  Problem Relation Age of Onset  . Diabetes Mother     Social History:  reports that she has quit smoking. She has never used smokeless tobacco. She reports previous alcohol use. She reports that she does not use drugs.  Allergies: No Known Allergies  Medications: I have reviewed the patient's current medications.  Results for orders placed or performed during the hospital encounter of 01/03/18 (from the past 48 hour(s))  CBC     Status: Abnormal   Collection Time: 01/03/18  4:15 PM  Result Value Ref Range   WBC 8.2 4.0 - 10.5 K/uL   RBC 3.14 (L) 3.87 - 5.11 MIL/uL  Hemoglobin 9.7 (L) 12.0 - 15.0 g/dL   HCT 16.130.2 (L) 09.636.0 - 04.546.0 %   MCV 96.2 80.0 - 100.0 fL   MCH 30.9 26.0 - 34.0 pg   MCHC 32.1 30.0 - 36.0 g/dL   RDW 40.913.2 81.111.5 - 91.415.5 %   Platelets 291 150 - 400 K/uL   nRBC 0.0 0.0 - 0.2 %    Comment: Performed at [facility], [address] [place], [place] [address]  Ferritin     Status: None   Collection Time: 01/03/18  4:15 PM  Result Value Ref Range   Ferritin 11 11 - 307 ng/mL    Comment: Performed at [facility], [address] [place], [place] [address]    No results found.  Review of Systems  Constitutional: Positive for malaise/fatigue. Negative for chills, fever and weight loss.  HENT: Negative for congestion, hearing loss and sinus pain.   Eyes: Negative for blurred vision and double vision.  Respiratory: Negative for cough, sputum production, shortness of breath and wheezing.   Cardiovascular: Negative for chest pain, palpitations, orthopnea and leg swelling.   Gastrointestinal: Positive for nausea. Negative for abdominal pain, constipation, diarrhea and vomiting.  Genitourinary: Negative for dysuria, flank pain, frequency, hematuria and urgency.  Musculoskeletal: Negative for back pain, falls and joint pain.  Skin: Negative for itching and rash.  Neurological: Positive for dizziness, weakness and headaches.  Psychiatric/Behavioral: Negative for depression, substance abuse and suicidal ideas. The patient has insomnia. The patient is not nervous/anxious.    Blood pressure 105/61, pulse 95, temperature 98.2 F (36.8 C), temperature source Oral, resp. rate 18, last menstrual period 06/20/2017. Physical Exam  Nursing note and vitals reviewed. Constitutional: She is oriented to person, place, and time. She appears well-developed and well-nourished.  HENT:  Head: Normocephalic and atraumatic.  Cardiovascular: Normal rate and regular rhythm.  Respiratory: Effort normal and breath sounds normal.  GI: Soft. Bowel sounds are normal.  Musculoskeletal: Normal range of motion.  Neurological: She is alert and oriented to person, place, and time.  Skin: Skin is warm and dry.  Psychiatric: She has a normal mood and affect. Her behavior is normal. Judgment and thought content normal.    Assessment/Plan: 37yo G5P3105 1140w1d 1. Symptomatic anemia and hypotension- discussed options with patient of IV iron transfusion with hematology vs blood transfusion today. She would like to proceed with a transfusion. Discussed risks of transfusion reaction, contracting HIV, HepB and HepC, and sepsis or death from the blood transfusion.  2. Continue with IVF hydration- 1st bag to be banana bag.  3. Nausea- Zofran PRN 4. Pain- Tylenol PRN 5. Insomnia- Ambien PRN 6. Regular Diet 7.  Fetal monitoring- Heart tones q shift    R  01/03/2018, 10:17 PM

## 2018-01-03 NOTE — Progress Notes (Signed)
Called to discuss referral to hematology. But patient is symptomatic and reported a fainting episode. Will have her come to the hospital for further evaluation. Will likely need a blood transfusion.   Adelene Idler MD [facility], [facility] 01/03/2018 2:16 PM

## 2018-01-03 NOTE — OB Triage Note (Signed)
Pt c/o, feeling very fatigued, and wore out. She has had black out episodes and feels really drained. No vaginal leaking of fluid or blood. Some N/V

## 2018-01-03 NOTE — H&P (Signed)
OB History & Physical   History of Present Illness:  Chief Complaint: fatigue, general malaise and discomforts  HPI:  Kristen Mcpherson is a 38 y.o. G78P3105 female with EDC=03/26/2017 at [redacted]w[redacted]d dated by LMP and confirmed with 11wk5d ultraound.  Her pregnancy has been complicated by AMA, a history of GDM with a previous pregnancy, anxiety/depression, a HGSIL PAP with +HRHPV, a history of HSV and anemia. Dr Jerene Pitch spoke with he on the phone earlier in the day to discuss referral to hematology and recommended she come to the hospital today for observation and possible blood transfusion due to generally not feeling well including, fatigue/malaise, nausea, dizziness, headaches and having a fainting episode today. She was in bed all day yesterday because she had no energy and she felt lightheaded when she was up. She has pain in her lower back. When she has the pain, she feels pelvic pressure.  Past history is significant for having an appendectomy after a ruptured appendix when she was 38 years old. No other abdominal surgery.  She admits positive fetal movement. She denies leakage of fluid or vaginal bleeding. She denies contractions.   Dr Jerene Pitch discussed plan of care with patient who verbalized understanding and agreed with plan for blood transfusion and overnight observation with IV hydration.  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 Antibody: negative  Anatomic [place]  Rubella: Immune (07/19 0000)  Varicella: Immune  GTT Early: 11 Third trimester:  RPR: Nonreactive (07/19 0000)   Rhogam  not needed HBsAg: Negative (07/19 0000)   TDaP vaccine                        Flu Shot: Declines HIV: Non-reactive (07/19 0000)   Baby Food                                 GBS:   Contraception  Mirena? Pap: HSIL HPV +  CBB     CS/VBAC  HSV+ needs valtrex at 36 weeks [ ]   Support Person        Maternal Medical History:   Past Medical History:  Diagnosis Date  . Anxiety   . Asthma     Past Surgical History:  Procedure Laterality Date  . APPENDECTOMY     OB History  Gravida Para Term Preterm AB Living  5 4 3 1   5   SAB TAB Ectopic Multiple Live Births        1 5    # Outcome Date GA Lbr Len/2nd Weight Sex Delivery Anes PTL Lv  5 Current           4A Preterm 10/23/11 [redacted]w[redacted]d  2381 g F Vag-Spont   LIV  4B Preterm 10/23/11 [redacted]w[redacted]d  2977 g M Vag-Spont   LIV     Complications: Heart abnormality  3 Term 10/09/05 [redacted]w[redacted]d  2920 g M Vag-Spont   LIV  2 Term 09/20/03 [redacted]w[redacted]d  2977 g M Vag-Spont   LIV  1 Term 07/04/99 [redacted]w[redacted]d  3572 g M Vag-Spont   LIV   No Known Allergies  Prior to Admission medications   Medication Sig Start Date End Date Taking? Authorizing Provider  Prenat w/o  A-FeCbGl-DSS-FA-DHA (CITRANATAL ASSURE) 35-1 & 300 MG tablet Take 2 tablets by mouth daily. 08/29/17  Yes Schuman, Christanna R, MD  Acetaminophen (TYLENOL PO) Take by mouth.    [provider]  doxylamine, Sleep, (UNISOM) 25 MG tablet Take 1 tablet (25 mg total) by mouth at bedtime. Patient not taking: Reported on 12/02/2017 08/29/17   Natale MilchSchuman, Christanna R, MD  Doxylamine-Pyridoxine (DICLEGIS) 10-10 MG TBEC Take 2 tablets by mouth at bedtime. If symptoms persist, add one tablet in the morning and one in the afternoon Patient not taking: Reported on 12/02/2017 08/30/17   Natale MilchSchuman, Christanna R, MD  pyridOXINE (VITAMIN B-6) 25 MG tablet Take 1 tablet (25 mg total) by mouth 4 (four) times daily. Patient not taking: Reported on 12/02/2017 08/29/17   Natale MilchSchuman, Christanna R, MD  sertraline (ZOLOFT) 50 MG tablet Take 1 tablet (50 mg total) by mouth daily. Patient not taking: Reported on 12/02/2017 08/29/17   Natale MilchSchuman, Christanna R, MD          Social History: She  reports that she has  quit smoking. She has never used smokeless tobacco. She reports previous alcohol use. She reports that she does not use drugs.  Family History: family history includes Diabetes in her mother.   Review of Systems: Negative x 10 systems reviewed except as noted in the HPI.      Physical Exam:  Vital Signs: BP 105/61   Pulse 95   Temp 98.2 F (36.8 C) (Oral)   Resp 18   LMP 06/20/2017 (Exact Date)  General: gravid WF who is sitting up in bed and rocking her hips. She appears uncomfortable HEENT: normocephalic, atraumatic Heart: regular rate & rhythm.  No murmurs/rubs/gallops Lungs: clear to auscultation bilaterally Abdomen: gravid, positive fetal heart tones, mild to palpation, bowel sounds active Extremities: non-tender, symmetric, no edema bilaterally.   Neurologic: Alert & oriented x 3.   Pelvic: deferred Baseline FHR: 155 with accelerations to 170 Toco: negative for contractions  LABS:  Results for orders placed or performed during the hospital encounter of 01/03/18 (from the past 24 hour(s))  CBC     Status: Abnormal   Collection Time: 01/03/18  4:15 PM  Result Value Ref Range   WBC 8.2 4.0 - 10.5 K/uL   RBC 3.14 (L) 3.87 - 5.11 MIL/uL   Hemoglobin 9.7 (L) 12.0 - 15.0 g/dL   HCT 16.130.2 (L) 09.636.0 - 04.546.0 %   MCV 96.2 80.0 - 100.0 fL   MCH 30.9 26.0 - 34.0 pg   MCHC 32.1 30.0 - 36.0 g/dL   RDW 40.913.2 81.111.5 - 91.415.5 %   Platelets 291 150 - 400 K/uL   nRBC 0.0 0.0 - 0.2 %  Ferritin     Status: None   Collection Time: 01/03/18  4:15 PM  Result Value Ref Range   Ferritin 11 11 - 307 ng/mL   Assessment:  Kristen Mcpherson is a 38 y.o. 285P3105 female at 3137w1d with symptomatic anemia, hypotension  Plan:  1. Admit for observation 2. Blood transfusion 3. IV hydration 4. Nausea- zofran PRN 5. Insomnia- Ambien PRN 6. Regular diet 7. Fetal heart tones q shift   Kristen Mcpherson, [facility] 01/03/2018 10:29 PM

## 2018-01-04 ENCOUNTER — Encounter: Payer: Self-pay | Admitting: *Deleted

## 2018-01-04 DIAGNOSIS — O99013 Anemia complicating pregnancy, third trimester: Secondary | ICD-10-CM | POA: Diagnosis not present

## 2018-01-04 LAB — COMPREHENSIVE METABOLIC PANEL
ALT: 9 U/L (ref 0–44)
AST: 13 U/L — ABNORMAL LOW (ref 15–41)
Albumin: 2.2 g/dL — ABNORMAL LOW (ref 3.5–5.0)
Alkaline Phosphatase: 78 U/L (ref 38–126)
Anion gap: 5 (ref 5–15)
BUN: 6 mg/dL (ref 6–20)
CO2: 21 mmol/L — ABNORMAL LOW (ref 22–32)
Calcium: 8.1 mg/dL — ABNORMAL LOW (ref 8.9–10.3)
Chloride: 109 mmol/L (ref 98–111)
Creatinine, Ser: 0.48 mg/dL (ref 0.44–1.00)
GFR calc Af Amer: >60 mL/min (ref >60)
GFR calc non Af Amer: >60 mL/min (ref >60)
Glucose, Bld: 98 mg/dL (ref 70–99)
Potassium: 3.9 mmol/L (ref 3.5–5.1)
Sodium: 135 mmol/L (ref 135–145)
Total Bilirubin: 0.7 mg/dL (ref 0.3–1.2)
Total Protein: 4.6 g/dL — ABNORMAL LOW (ref 6.5–8.1)

## 2018-01-04 LAB — PREPARE RBC (CROSSMATCH)

## 2018-01-04 LAB — CBC
HCT: 28.6 % — ABNORMAL LOW (ref 36.0–46.0)
Hemoglobin: 9.2 g/dL — ABNORMAL LOW (ref 12.0–15.0)
MCH: 31 pg (ref 26.0–34.0)
MCHC: 32.2 g/dL (ref 30.0–36.0)
MCV: 96.3 fL (ref 80.0–100.0)
Platelets: 255 10*3/uL (ref 150–400)
RBC: 2.97 MIL/uL — ABNORMAL LOW (ref 3.87–5.11)
RDW: 13.9 % (ref 11.5–15.5)
WBC: 7.4 10*3/uL (ref 4.0–10.5)
nRBC: 0 % (ref 0.0–0.2)

## 2018-01-04 LAB — ABO/RH: ABO/RH(D): A POS

## 2018-01-04 LAB — VITAMIN B12: Vitamin B-12: 94 pg/mL — ABNORMAL LOW (ref 180–914)

## 2018-01-04 MED ORDER — ACETAMINOPHEN 500 MG PO TABS
1000.0000 mg | ORAL_TABLET | Freq: Once | ORAL | Status: AC | PRN
  Administered 2018-01-04: 1000 mg via ORAL
  Filled 2018-01-04: qty 2

## 2018-01-04 MED ORDER — ALUM & MAG HYDROXIDE-SIMETH 200-200-20 MG/5ML PO SUSP
30.0000 mL | ORAL | Status: DC | PRN
  Administered 2018-01-04: 30 mL via ORAL
  Filled 2018-01-04: qty 30

## 2018-01-04 NOTE — Plan of Care (Signed)
Discharge instructions gone over with patient and her significant other.  Questions answered.  Belongings gathered together to go home.

## 2018-01-04 NOTE — Discharge Instructions (Signed)
Iron Deficiency Anemia, Adult  Iron-deficiency anemia is when you have a low amount of red blood cells or hemoglobin. This happens because you have too little iron in your body. Hemoglobin carries oxygen to parts of the body. Anemia can cause your body to not get enough oxygen. It may or may not cause symptoms.  Follow these instructions at home:  Medicines  · Take over-the-counter and prescription medicines only as told by your doctor. This includes iron pills (supplements) and vitamins.  · If you cannot handle taking iron pills by mouth, ask your doctor about getting iron through:  ? A vein (intravenously).  ? A shot (injection) into a muscle.  · Take iron pills when your stomach is empty. If you cannot handle this, take them with food.  · Do not drink milk or take antacids at the same time as your iron pills.  · To prevent trouble pooping (constipation), eat fiber or take medicine (stool softener) as told by your doctor.  Eating and drinking    · Talk with your doctor before changing the foods you eat. He or she may tell you to eat foods that have a lot of iron, such as:  ? Liver.  ? Lowfat (lean) beef.  ? Breads and cereals that have iron added to them (fortified breads and cereals).  ? Eggs.  ? Dried fruit.  ? Dark green, leafy vegetables.  · Drink enough fluid to keep your pee (urine) clear or pale yellow.  · Eat fresh fruits and vegetables that are high in vitamin C. They help your body to use iron. Foods with a lot of vitamin C include:  ? Oranges.  ? Peppers.  ? Tomatoes.  ? Mangoes.  General instructions  · Return to your normal activities as told by your doctor. Ask your doctor what activities are safe for you.  · Keep yourself clean, and keep things clean around you (your surroundings). Anemia can make you get sick more easily.  · Keep all follow-up visits as told by your doctor. This is important.  Contact a doctor if:  · You feel sick to your stomach (nauseous).  · You throw up (vomit).  · You feel  weak.  · You are sweating for no clear reason.  · You have trouble pooping, such as:  ? Pooping (having a bowel movement) less than 3 times a week.  ? Straining to poop.  ? Having poop that is hard, dry, or larger than normal.  ? Feeling full or bloated.  ? Pain in the lower belly.  ? Not feeling better after pooping.  Get help right away if:  · You pass out (faint). If this happens, do not drive yourself to the hospital. Call your local emergency services (911 in the [place]  · You have chest pain.  · You have shortness of breath that:  ? Is very bad.  ? Gets worse with physical activity.  · You have a fast heartbeat.  · You get light-headed when getting up from sitting or lying down.  This information is not intended to replace advice given to you by your health care provider. Make sure you discuss any questions you have with your health care provider.  Document Released: 01/20/2010 Document Revised: 09/07/2015 Document Reviewed: 09/07/2015  Elsevier Interactive Patient Education © 2019 [employer] Inc[employer]

## 2018-01-04 NOTE — Discharge Summary (Signed)
Physician Discharge Summary  Patient ID: Kristen Mcpherson MRN: [id number] DOB/AGE: 1980/04/11 38 y.o.  Admit date: 01/03/2018 Discharge date: 01/04/2018  Admission Diagnoses: [redacted] weeks pregnant, Anemia, Syncope, nausea and vomiting  Discharge Diagnoses: same  Discharged Condition: good  Hospital Course: Pt admitted and examined, given transfusion for symptomatic anemia, IVF for dehydration.  Consults: None  Has appt w hematology 01/08/2018  Significant Diagnostic Studies:  Results for orders placed or performed during the hospital encounter of 01/03/18  CBC  Result Value Ref Range   WBC 8.2 4.0 - 10.5 K/uL   RBC 3.14 (L) 3.87 - 5.11 MIL/uL   Hemoglobin 9.7 (L) 12.0 - 15.0 g/dL   HCT 94.4 (L) 96.7 - 59.1 %   MCV 96.2 80.0 - 100.0 fL   MCH 30.9 26.0 - 34.0 pg   MCHC 32.1 30.0 - 36.0 g/dL   RDW 63.8 46.6 - 59.9 %   Platelets 291 150 - 400 K/uL   nRBC 0.0 0.0 - 0.2 %  Ferritin  Result Value Ref Range   Ferritin 11 11 - 307 ng/mL  Vitamin B12  Result Value Ref Range   Vitamin B-12 94 (L) 180 - 914 pg/mL  CBC  Result Value Ref Range   WBC 7.4 4.0 - 10.5 K/uL   RBC 2.97 (L) 3.87 - 5.11 MIL/uL   Hemoglobin 9.2 (L) 12.0 - 15.0 g/dL   HCT 35.7 (L) 01.7 - 79.3 %   MCV 96.3 80.0 - 100.0 fL   MCH 31.0 26.0 - 34.0 pg   MCHC 32.2 30.0 - 36.0 g/dL   RDW 90.3 00.9 - 23.3 %   Platelets 255 150 - 400 K/uL   nRBC 0.0 0.0 - 0.2 %  Comprehensive metabolic panel  Result Value Ref Range   Sodium 135 135 - 145 mmol/L   Potassium 3.9 3.5 - 5.1 mmol/L   Chloride 109 98 - 111 mmol/L   CO2 21 (L) 22 - 32 mmol/L   Glucose, Bld 98 70 - 99 mg/dL   BUN 6 6 - 20 mg/dL   Creatinine, Ser 0.07 0.44 - 1.00 mg/dL   Calcium 8.1 (L) 8.9 - 10.3 mg/dL   Total Protein 4.6 (L) 6.5 - 8.1 g/dL   Albumin 2.2 (L) 3.5 - 5.0 g/dL   AST 13 (L) 15 - 41 U/L   ALT 9 0 - 44 U/L   Alkaline Phosphatase 78 38 - 126 U/L   Total Bilirubin 0.7 0.3 - 1.2 mg/dL   GFR calc non Af Amer >60 >60 mL/min   GFR calc Af Amer  >60 >60 mL/min   Anion gap 5 5 - 15  Type and screen [facility]  Result Value Ref Range   ABO/RH(D) A POS    Antibody Screen NEG    Sample Expiration 01/06/2018    Unit Number [id number]    Blood Component Type RED CELLS,LR    Unit division 00    Status of Unit ISSUED    Transfusion Status OK TO TRANSFUSE    Crossmatch Result      Compatible Performed at [facility], [address] [place], [place] [address]   Prepare [facility]  Result Value Ref Range   Order Confirmation      ORDER PROCESSED BY BLOOD BANK Performed at [facility], [address], [place], [place] [address]   ABO/Rh  Result Value Ref Range   ABO/RH(D)      A POS Performed at [facility], [address], [place], [place] [address]   BPAM RBC  Result Value Ref Range   ISSUE DATE / TIME [id number]    Blood Product Unit Number [id number]    PRODUCT CODE [id number]    Unit Type and Rh 0600    Blood Product Expiration Date [id number]    Treatments: IV hydration and therapies: transfusion  Discharge Exam: Blood pressure (!) 97/48, pulse 73, temperature 97.8 F (36.6 C), temperature source Oral, resp. rate 16, last menstrual period 06/20/2017, SpO2 96 %. General appearance: alert, cooperative and no distress Back: symmetric, no curvature. ROM normal. No CVA tenderness. Resp: clear to auscultation bilaterally Cardio: regular rate and rhythm, S1, S2 normal, no murmur, click, rub or gallop GI: gravid, NT, ND Extremities: extremities normal, atraumatic, no cyanosis or edema Skin: Skin color, texture, turgor normal. No rashes or lesions Neurologic: Grossly normal  FHT 140s  Disposition: Discharge disposition: 01-Home or Self Care      Discharge Instructions    Call MD for:   Complete by:  As directed    Worsening contractions or pain; leakage of fluid; bleeding.   Diet general   Complete by:  As directed    Increase activity slowly   Complete  by:  As directed      Allergies as of 01/04/2018   No Known Allergies     Medication List    TAKE these medications   butalbital-acetaminophen-caffeine 50-325-40 MG tablet Commonly known as:  FIORICET, ESGIC Take 1-2 tablets by mouth every 6 (six) hours as needed for headache.   CITRANATAL ASSURE 35-1 & 300 MG tablet Take 2 tablets by mouth daily.   Doxylamine-Pyridoxine 10-10 MG Tbec Commonly known as:  DICLEGIS Take 2 tablets by mouth at bedtime. If symptoms persist, add one tablet in the morning and one in the afternoon   esomeprazole 20 MG capsule Commonly known as:  NEXIUM Take 1 capsule (20 mg total) by mouth daily.   ferrous sulfate 325 (65 FE) MG tablet Commonly known as:  FERROUSUL Take 1 tablet (325 mg total) by mouth 2 (two) times daily.   ondansetron 4 MG disintegrating tablet Commonly known as:  ZOFRAN ODT Take 1 tablet (4 mg total) by mouth every 6 (six) hours as needed for nausea.   sertraline 50 MG tablet Commonly known as:  ZOLOFT Take 1 tablet (50 mg total) by mouth daily.      Signed: Letitia Libra 01/04/2018, 9:18 AM

## 2018-01-05 LAB — TYPE AND SCREEN
ABO/RH(D): A POS
Antibody Screen: NEGATIVE
Unit division: 0

## 2018-01-05 LAB — BPAM RBC
Blood Product Expiration Date: 202001132359
ISSUE DATE / TIME: 202001040017
Unit Type and Rh: 600

## 2018-01-07 LAB — FOLATE RBC
Folate, Hemolysate: 323 ng/mL
Folate, RBC: UNDETERMINED ng/mL

## 2018-01-08 ENCOUNTER — Inpatient Hospital Stay: Payer: Medicaid Other | Attending: Oncology | Admitting: Oncology

## 2018-01-08 ENCOUNTER — Encounter: Payer: Self-pay | Admitting: Oncology

## 2018-01-08 ENCOUNTER — Other Ambulatory Visit: Payer: Self-pay

## 2018-01-08 ENCOUNTER — Inpatient Hospital Stay: Payer: Medicaid Other

## 2018-01-08 VITALS — BP 92/61 | HR 89 | Temp 96.6°F | Ht 66.0 in | Wt 202.2 lb

## 2018-01-08 DIAGNOSIS — D519 Vitamin B12 deficiency anemia, unspecified: Secondary | ICD-10-CM | POA: Diagnosis not present

## 2018-01-08 DIAGNOSIS — O99013 Anemia complicating pregnancy, third trimester: Secondary | ICD-10-CM

## 2018-01-08 DIAGNOSIS — D509 Iron deficiency anemia, unspecified: Secondary | ICD-10-CM

## 2018-01-08 DIAGNOSIS — Z3A28 28 weeks gestation of pregnancy: Secondary | ICD-10-CM | POA: Diagnosis not present

## 2018-01-08 DIAGNOSIS — D508 Other iron deficiency anemias: Secondary | ICD-10-CM | POA: Insufficient documentation

## 2018-01-08 HISTORY — DX: Iron deficiency anemia, unspecified: D50.9

## 2018-01-08 LAB — IRON AND TIBC
Iron: 69 ug/dL (ref 28–170)
Saturation Ratios: 13 % (ref 10.4–31.8)
TIBC: 515 ug/dL — ABNORMAL HIGH (ref 250–450)
UIBC: 447 ug/dL

## 2018-01-08 LAB — CBC WITH DIFFERENTIAL/PLATELET
Abs Immature Granulocytes: 0.08 10*3/uL — ABNORMAL HIGH (ref 0.00–0.07)
Basophils Absolute: 0 10*3/uL (ref 0.0–0.1)
Basophils Relative: 0 %
Eosinophils Absolute: 0.2 10*3/uL (ref 0.0–0.5)
Eosinophils Relative: 2 %
HCT: 33.4 % — ABNORMAL LOW (ref 36.0–46.0)
Hemoglobin: 10.9 g/dL — ABNORMAL LOW (ref 12.0–15.0)
Immature Granulocytes: 1 %
Lymphocytes Relative: 16 %
Lymphs Abs: 1.6 10*3/uL (ref 0.7–4.0)
MCH: 30.6 pg (ref 26.0–34.0)
MCHC: 32.6 g/dL (ref 30.0–36.0)
MCV: 93.8 fL (ref 80.0–100.0)
Monocytes Absolute: 0.8 10*3/uL (ref 0.1–1.0)
Monocytes Relative: 8 %
Neutro Abs: 7.3 10*3/uL (ref 1.7–7.7)
Neutrophils Relative %: 73 %
Platelets: 296 10*3/uL (ref 150–400)
RBC: 3.56 MIL/uL — ABNORMAL LOW (ref 3.87–5.11)
RDW: 13.9 % (ref 11.5–15.5)
WBC: 9.9 10*3/uL (ref 4.0–10.5)
nRBC: 0 % (ref 0.0–0.2)

## 2018-01-08 LAB — RETIC PANEL
Immature Retic Fract: 23.2 % — ABNORMAL HIGH (ref 2.3–15.9)
RBC.: 3.56 MIL/uL — ABNORMAL LOW (ref 3.87–5.11)
Retic Count, Absolute: 96.5 10*3/uL (ref 19.0–186.0)
Retic Ct Pct: 2.7 % (ref 0.4–3.1)
Reticulocyte Hemoglobin: 35.3 pg (ref >27.9)

## 2018-01-08 LAB — FERRITIN: Ferritin: 78 ng/mL (ref 11–307)

## 2018-01-08 LAB — LACTATE DEHYDROGENASE: LDH: 125 U/L (ref 98–192)

## 2018-01-08 LAB — TECHNOLOGIST SMEAR REVIEW

## 2018-01-08 NOTE — Progress Notes (Signed)
Patient here for initial visit. She will be [redacted] weeks pregnant tomorrow.  Patient states feeling very tired and weak most of the time.

## 2018-01-08 NOTE — Progress Notes (Signed)
Hematology/Oncology Consult note [facility] [phone] [phone]   Patient Care Team: Patient, No Pcp Per as PCP - General (General Practice)  REFERRING PROVIDER: Patient, No Pcp Per GYN Dr. Gaynelle Arabian CHIEF COMPLAINTS/REASON FOR VISIT:  Evaluation of anemia  HISTORY OF PRESENTING ILLNESS:  Kristen Mcpherson is a  38 y.o.  female with PMH listed below who was referred to me for evaluation of iron deficiency anemia Patient is currently in her fifth pregnancy, currently in her third trimester, going to be [redacted] weeks pregnant tomorrow. Patient was admitted on January 01, 2018 due to nausea, headache, leg cramps..  Received IV hydration, oral potassium, Reglan  She called and reported feeling weak and lightheaded, "black out" episofde.  Sometimes profound weak that she needs to sit down, also multiple complaints with headache, nausea, fatigue and pain in her legs.  Low blood pressure in triage, orthostatics were stable.  Received 1 unit of PRBC transfusion to treat symptomatic anemia.  Also received IV hydration.  Patient reports feeling better for 2 days after blood transfusion and symptoms of weakness/fatigue recurred..  Refer to hematology for further work-up and management. She has been started on oral iron supplements with ferrous sulfate 325 mg twice daily.  Associated signs and symptoms: Patient reports fatigue, lightheaded..  Mild SOB with exertion.  Denies weight loss, easy bruising, hematochezia, hemoptysis, hematuria. Context:  History of iron deficiency:  Rectal bleeding: Denies Menstrual bleeding/ Vaginal bleeding : Denies Hematemesis or hemoptysis : denies Blood in urine : denies  Last endoscopy: Never had Fatigue: Yes.   Also mild numbness tingling of her fingertips which she attributes to her carpal tunnel symptoms She does not have good appetite due to nausea.  Not eating much. Denies any night sweating, bleeding events, abdominal  pain, lower extremity edema. Former smoker, quit date 01/08/2017.  Denies any current alcohol use. Review of Systems  Constitutional: Positive for appetite change and fatigue. Negative for chills and fever.  HENT:   Negative for hearing loss and voice change.   Eyes: Negative for eye problems.  Respiratory: Negative for chest tightness and cough.   Cardiovascular: Negative for chest pain.  Gastrointestinal: Negative for abdominal distention, abdominal pain and blood in stool.  Endocrine: Negative for hot flashes.  Genitourinary: Negative for difficulty urinating and frequency.   Musculoskeletal: Negative for arthralgias.  Skin: Negative for itching and rash.  Neurological: Positive for numbness. Negative for extremity weakness.  Hematological: Negative for adenopathy.  Psychiatric/Behavioral: Negative for confusion.    MEDICAL HISTORY:  Past Medical History:  Diagnosis Date  . Anemia   . Anxiety   . Asthma   . Iron deficiency anemia 01/08/2018    SURGICAL HISTORY: Past Surgical History:  Procedure Laterality Date  . APPENDECTOMY      SOCIAL HISTORY: Social History   Socioeconomic History  . Marital status: Single    Spouse name: Not on file  . Number of children: Not on file  . Years of education: Not on file  . Highest education level: Not on file  Occupational History  . Not on file  Social Needs  . Financial resource strain: Not on file  . Food insecurity:    Worry: Not on file    Inability: Not on file  . Transportation needs:    Medical: Not on file    Non-medical: Not on file  Tobacco Use  . Smoking status: Former Smoker    Last attempt to quit: 01/08/2017    Years since  quitting: 1.0  . Smokeless tobacco: Never Used  Substance and Sexual Activity  . Alcohol use: Not Currently  . Drug use: Never  . Sexual activity: Yes    Birth control/protection: Surgical    Comment: BTL  Lifestyle  . Physical activity:    Days per week: Not on file    Minutes per  session: Not on file  . Stress: Not on file  Relationships  . Social connections:    Talks on phone: Not on file    Gets together: Not on file    Attends religious service: Not on file    Active member of club or organization: Not on file    Attends meetings of clubs or organizations: Not on file    Relationship status: Not on file  . Intimate partner violence:    Fear of current or ex partner: No    Emotionally abused: No    Physically abused: No    Forced sexual activity: No  Other Topics Concern  . Not on file  Social History Narrative  . Not on file    FAMILY HISTORY: Family History  Problem Relation Age of Onset  . Diabetes Mother   . Arthritis Mother   . Depression Mother   . Anxiety disorder Mother   . Anemia Mother   . Hyperlipidemia Father   . Depression Father   . ADD / ADHD Brother     ALLERGIES:  has No Known Allergies.  MEDICATIONS:  Current Outpatient Medications  Medication Sig Dispense Refill  . butalbital-acetaminophen-caffeine (FIORICET, ESGIC) 50-325-40 MG tablet Take 1-2 tablets by mouth every 6 (six) hours as needed for headache. 20 tablet 0  . esomeprazole (NEXIUM) 20 MG capsule Take 1 capsule (20 mg total) by mouth daily. 30 capsule 1  . ferrous sulfate (FERROUSUL) 325 (65 FE) MG tablet Take 1 tablet (325 mg total) by mouth 2 (two) times daily. 60 tablet 1  . ondansetron (ZOFRAN ODT) 4 MG disintegrating tablet Take 1 tablet (4 mg total) by mouth every 6 (six) hours as needed for nausea. 20 tablet 0  . Prenat w/o A-FeCbGl-DSS-FA-DHA (CITRANATAL ASSURE) 35-1 & 300 MG tablet Take 2 tablets by mouth daily. 60 tablet 11   No current facility-administered medications for this visit.      PHYSICAL EXAMINATION: ECOG PERFORMANCE STATUS: 1 - Symptomatic but completely ambulatory Vitals:   01/08/18 1127  BP: 92/61  Pulse: 89  Temp: (!) 96.6 F (35.9 C)   Filed Weights   01/08/18 1127  Weight: 202 lb 3.2 oz (91.7 kg)    Physical  Exam Constitutional:      General: She is not in acute distress. HENT:     Head: Normocephalic and atraumatic.  Eyes:     General: No scleral icterus.    Pupils: Pupils are equal, round, and reactive to light.  Neck:     Musculoskeletal: Normal range of motion and neck supple.  Cardiovascular:     Rate and Rhythm: Normal rate and regular rhythm.     Heart sounds: Normal heart sounds.  Pulmonary:     Effort: Pulmonary effort is normal. No respiratory distress.     Breath sounds: No wheezing.  Abdominal:     General: Bowel sounds are normal. There is no distension.     Palpations: Abdomen is soft. There is no mass.     Tenderness: There is no abdominal tenderness.     Comments: Gravid uterus  Musculoskeletal: Normal range of motion.  General: No deformity.  Skin:    General: Skin is warm and dry.     Coloration: Skin is pale.     Findings: No erythema or rash.  Neurological:     Mental Status: She is alert and oriented to person, place, and time.     Cranial Nerves: No cranial nerve deficit.     Coordination: Coordination normal.  Psychiatric:        Behavior: Behavior normal.        Thought Content: Thought content normal.      LABORATORY DATA:  I have reviewed the data as listed Lab Results  Component Value Date   WBC 7.4 01/04/2018   HGB 9.2 (L) 01/04/2018   HCT 28.6 (L) 01/04/2018   MCV 96.3 01/04/2018   PLT 255 01/04/2018   Recent Labs    12/06/17 1526 01/01/18 0115 01/04/18 0528  NA 138 134* 135  K 3.8 3.6 3.9  CL 106 107 109  CO2 23 22 21*  GLUCOSE 89 87 98  BUN 6 8 6   CREATININE 0.58 0.49 0.48  CALCIUM 9.6 8.3* 8.1*  GFRNONAA >60 >60 >60  GFRAA >60 >60 >60  PROT 6.5  --  4.6*  ALBUMIN 3.3*  --  2.2*  AST 18  --  13*  ALT 10  --  9  ALKPHOS 50  --  78  BILITOT 0.2*  --  0.7   Iron/TIBC/Ferritin/ %Sat    Component Value Date/Time   FERRITIN 11 01/03/2018 1615     No results found.   ASSESSMENT & PLAN:  1. Other iron deficiency  anemia   2. Anemia due to vitamin B12 deficiency, unspecified B12 deficiency type   3. [redacted] weeks gestation of pregnancy    Previous labs reviewed and discussed with patient. Ferritin was low at 11.  No differential was obtained in the past. Chemistry labs done on 01/04/2017 showed a normal bilirubin, AST ALT not elevated, normal kidney function. Vitamin B12 level was decreasing to 94. I will obtain CBC with differential, smear, intrinsic factor antibody, antiparietal antibody, reticulocyte panel, iron and TIBC.  Given her severe symptoms, I recommend IV iron infusion. Plan IV iron with Venofer 200mg  weekly x 4 doses. Allergy reactions/infusion reaction including anaphylactic reaction discussed with patient. Other side effects include but not limited to high blood pressure, skin rash, weight gain, leg swelling, etc. Patient voices understanding and willing to proceed.  #Vitamin B12 deficiency, plan proceed with parenteral vitamin B12 1000 MCG intramuscular daily x5 followed by weekly x4.  Orders Placed This Encounter  Procedures  . CBC with Differential/Platelet    Standing Status:   Future    Standing Expiration Date:   01/09/2019  . Technologist smear review    Standing Status:   Future    Number of Occurrences:   1    Standing Expiration Date:   01/09/2019  . Retic Panel    Standing Status:   Future    Number of Occurrences:   1    Standing Expiration Date:   01/09/2019  . Intrinsic Factor Antibodies    Standing Status:   Future    Number of Occurrences:   1    Standing Expiration Date:   01/09/2019  . Anti-parietal antibody    Standing Status:   Future    Number of Occurrences:   1    Standing Expiration Date:   01/09/2019  . Lactate dehydrogenase    Standing Status:   Future  Number of Occurrences:   1    Standing Expiration Date:   01/09/2019  . CBC with Differential/Platelet    Standing Status:   Future    Number of Occurrences:   1    Standing Expiration Date:   01/09/2019  . Iron  and TIBC    Standing Status:   Future    Number of Occurrences:   1    Standing Expiration Date:   01/09/2019  . Ferritin    Standing Status:   Future    Number of Occurrences:   1    Standing Expiration Date:   01/09/2019    All questions were answered. The patient knows to call the clinic with any problems questions or concerns.  Return of visit: 6 weeks repeat CBC, iron, ferritin, vitamin B12.   Thank you for this kind referral and the opportunity to participate in the care of this patient. A copy of today's note is routed to referring provider  Total face to face encounter time for this patient visit was . >50% of the time was  spent in counseling and coordination of care.    Rickard Patience, MD, PhD Hematology Oncology [facility] Pager- [phone] 01/08/2018

## 2018-01-09 ENCOUNTER — Inpatient Hospital Stay: Payer: Medicaid Other

## 2018-01-09 DIAGNOSIS — D508 Other iron deficiency anemias: Secondary | ICD-10-CM | POA: Diagnosis not present

## 2018-01-09 LAB — INTRINSIC FACTOR ANTIBODIES: Intrinsic Factor: 1 AU/mL (ref 0.0–1.1)

## 2018-01-09 LAB — ANTI-PARIETAL ANTIBODY: Parietal Cell Antibody-IgG: 2.3 Units (ref 0.0–20.0)

## 2018-01-09 MED ORDER — CYANOCOBALAMIN 1000 MCG/ML IJ SOLN
1000.0000 ug | Freq: Once | INTRAMUSCULAR | Status: AC
  Administered 2018-01-09: 1000 ug via INTRAMUSCULAR

## 2018-01-10 ENCOUNTER — Inpatient Hospital Stay: Payer: Medicaid Other

## 2018-01-10 DIAGNOSIS — D508 Other iron deficiency anemias: Secondary | ICD-10-CM | POA: Diagnosis not present

## 2018-01-10 MED ORDER — CYANOCOBALAMIN 1000 MCG/ML IJ SOLN
1000.0000 ug | Freq: Once | INTRAMUSCULAR | Status: AC
  Administered 2018-01-10: 1000 ug via INTRAMUSCULAR

## 2018-01-13 ENCOUNTER — Ambulatory Visit (INDEPENDENT_AMBULATORY_CARE_PROVIDER_SITE_OTHER): Payer: Medicaid Other | Admitting: Obstetrics and Gynecology

## 2018-01-13 ENCOUNTER — Other Ambulatory Visit: Payer: Medicaid Other

## 2018-01-13 ENCOUNTER — Inpatient Hospital Stay: Payer: Medicaid Other

## 2018-01-13 VITALS — BP 102/60 | Wt 202.0 lb

## 2018-01-13 DIAGNOSIS — Z8632 Personal history of gestational diabetes: Secondary | ICD-10-CM

## 2018-01-13 DIAGNOSIS — F329 Major depressive disorder, single episode, unspecified: Secondary | ICD-10-CM

## 2018-01-13 DIAGNOSIS — N898 Other specified noninflammatory disorders of vagina: Secondary | ICD-10-CM | POA: Diagnosis not present

## 2018-01-13 DIAGNOSIS — Z3A29 29 weeks gestation of pregnancy: Secondary | ICD-10-CM

## 2018-01-13 DIAGNOSIS — O09522 Supervision of elderly multigravida, second trimester: Secondary | ICD-10-CM

## 2018-01-13 DIAGNOSIS — F32A Depression, unspecified: Secondary | ICD-10-CM

## 2018-01-13 DIAGNOSIS — D508 Other iron deficiency anemias: Secondary | ICD-10-CM | POA: Diagnosis not present

## 2018-01-13 DIAGNOSIS — F419 Anxiety disorder, unspecified: Secondary | ICD-10-CM

## 2018-01-13 DIAGNOSIS — R7309 Other abnormal glucose: Secondary | ICD-10-CM

## 2018-01-13 DIAGNOSIS — O09293 Supervision of pregnancy with other poor reproductive or obstetric history, third trimester: Secondary | ICD-10-CM

## 2018-01-13 DIAGNOSIS — O0972 Supervision of high risk pregnancy due to social problems, second trimester: Secondary | ICD-10-CM

## 2018-01-13 DIAGNOSIS — O26893 Other specified pregnancy related conditions, third trimester: Secondary | ICD-10-CM

## 2018-01-13 DIAGNOSIS — O26843 Uterine size-date discrepancy, third trimester: Secondary | ICD-10-CM

## 2018-01-13 MED ORDER — CYANOCOBALAMIN 1000 MCG/ML IJ SOLN
1000.0000 ug | Freq: Once | INTRAMUSCULAR | Status: AC
  Administered 2018-01-13: 1000 ug via INTRAMUSCULAR

## 2018-01-13 NOTE — Progress Notes (Signed)
ROB/3 Hr GTT C/o pressure, pain, dark colored discharge been going on x3 days

## 2018-01-13 NOTE — Progress Notes (Signed)
Routine Prenatal Care Visit  Subjective  Kristen Mcpherson is a 38 y.o. [id number] at 2668w4d being seen today for ongoing prenatal care.  She is currently monitored for the following issues for this high-risk pregnancy and has History of gestational diabetes in prior pregnancy, currently pregnant in third trimester; AMA (advanced maternal age) multigravida 35+, second trimester; Supervision of high risk pregnancy due to social problems in second trimester; Encounter for genetic screening for Down Syndrome; Anxiety and depression; Vaginal bleeding affecting early pregnancy; Cystic fibrosis carrier; HSIL on Pap smear of cervix; Abdominal pain affecting pregnancy, antepartum; [redacted] weeks gestation of pregnancy; Indication for care in labor and delivery, antepartum; Hypotension; and Iron deficiency anemia on their problem list.  ----------------------------------------------------------------------------------- Patient reports fatigue. She is seeing hematology now and starting B12 injections and iron transfusion. She  Reports new brown vaginal discharge.  Contractions: Not present. Vag. Bleeding: None.  Movement: Present. Denies leaking of fluid.  ----------------------------------------------------------------------------------- The following portions of the patient's history were reviewed and updated as appropriate: allergies, current medications, past family history, past medical history, past social history, past surgical history and problem list. Problem list updated.   Objective  Blood pressure 102/60, weight 202 lb (91.6 kg), last menstrual period 06/20/2017. Pregravid weight 168 lb (76.2 kg) Total Weight Gain 34 lb (15.4 kg) Urinalysis:      Fetal Status: Fetal Heart Rate (bpm): 166 Fundal Height: 32 cm Movement: Present     General:  Alert, oriented and cooperative. Patient is in no acute distress.  Skin: Skin is warm and dry. No rash noted.   Cardiovascular: Normal heart rate noted    Respiratory: Normal respiratory effort, no problems with respiration noted  Abdomen: Soft, gravid, appropriate for gestational age. Pain/Pressure: Present     Pelvic:  Cervical exam performed       normal discharge  Extremities: Normal range of motion.     Mental Status: Normal mood and affect. Normal behavior. Normal judgment and thought content.   Wet Prep: Clue Cells: Negative Fungal elements: Negative Trichomonas: Negative    Assessment   38 y.o. [id number] at 6268w4d by  03/27/2018, by Last Menstrual Period presenting for routine prenatal visit  Plan   Pregnancy #5 Problems (from 06/20/17 to present)    Problem Noted Resolved   Cystic fibrosis carrier 10/07/2017 by Rosalva FerronBaric, Michelle No   Overview Signed 10/07/2017 10:48 AM by Rosalva FerronBaric, Michelle    10/07/17 GC for CF carrier status.  Couple undecided regarding paternal CF carrier screening.       Vaginal bleeding affecting early pregnancy 09/05/2017 by Conard NovakJackson, Stephen D, MD No   History of gestational diabetes in prior pregnancy, currently pregnant in third trimester 08/29/2017 by Natale MilchSchuman, Christanna R, MD No   AMA (advanced maternal age) multigravida 35+, second trimester 08/29/2017 by Natale MilchSchuman, Christanna R, MD No   Supervision of high risk pregnancy due to social problems in second trimester 08/29/2017 by Natale MilchSchuman, Christanna R, MD No   Overview Addendum 12/06/2017  3:38 PM by Farrel ConnersGutierrez, Colleen, CNM    Clinic [facility] Prenatal Labs  Dating [place] Blood type: A/Positive/-- (07/19 0000) A +  Genetic Screen NIPS: Normal 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 Antibody:   Anatomic US  Rubella: Immune (07/19 0000)  Varicella: Immune  GTT Early: WNLThird trimester:  RPR: Nonreactive (07/19 0000)   Rhogam  not needed HBsAg: Negative (07/19 0000)   TDaP vaccine  Flu Shot: Declines HIV: Non-reactive (07/19 0000)   Baby Food                                GBS:   Contraception  Mirena? Pap: HSIL HPV +   CBB     CS/VBAC  HSV+ needs valtrex at 36 weeks [ ]   Support Person              Gestational age appropriate obstetric precautions including but not limited to vaginal bleeding, contractions, leaking of fluid and fetal movement were reviewed in detail with the patient.    She is seeing hematology for B12 and iron infusions. She felt better initially after the blood transfusions, but is now feeling fatigued again.  3 hour glucose today No bleeding seen on examination Normal wet mount. Nuswab sent.  Growth [place] at next visit for uterine size > dates.   Return in about 1 week (around 01/20/2018) for ROB and [place].  Natale Milch MD [facility], [facility] 01/13/2018, 1:20 PM

## 2018-01-14 ENCOUNTER — Inpatient Hospital Stay: Payer: Medicaid Other

## 2018-01-14 DIAGNOSIS — D508 Other iron deficiency anemias: Secondary | ICD-10-CM

## 2018-01-14 LAB — GESTATIONAL GLUCOSE TOLERANCE
Glucose, Fasting: 79 mg/dL (ref 65–94)
Glucose, GTT - 1 Hour: 166 mg/dL (ref 65–179)
Glucose, GTT - 2 Hour: 136 mg/dL (ref 65–154)
Glucose, GTT - 3 Hour: 129 mg/dL (ref 65–139)

## 2018-01-14 MED ORDER — CYANOCOBALAMIN 1000 MCG/ML IJ SOLN
1000.0000 ug | Freq: Once | INTRAMUSCULAR | Status: AC
  Administered 2018-01-14: 1000 ug via INTRAMUSCULAR

## 2018-01-15 ENCOUNTER — Ambulatory Visit: Payer: Medicaid Other

## 2018-01-15 ENCOUNTER — Inpatient Hospital Stay: Payer: Medicaid Other

## 2018-01-15 VITALS — BP 92/63 | HR 91 | Temp 98.7°F | Resp 18

## 2018-01-15 DIAGNOSIS — D508 Other iron deficiency anemias: Secondary | ICD-10-CM

## 2018-01-15 LAB — NUSWAB VAGINITIS PLUS (VG+)
Candida albicans, NAA: POSITIVE — AB
Candida glabrata, NAA: NEGATIVE
Chlamydia trachomatis, NAA: NEGATIVE
Neisseria gonorrhoeae, NAA: NEGATIVE
Trich vag by NAA: NEGATIVE

## 2018-01-15 MED ORDER — SODIUM CHLORIDE 0.9 % IV SOLN
Freq: Once | INTRAVENOUS | Status: AC
  Administered 2018-01-15: 14:00:00 via INTRAVENOUS
  Filled 2018-01-15: qty 250

## 2018-01-15 MED ORDER — CYANOCOBALAMIN 1000 MCG/ML IJ SOLN
1000.0000 ug | Freq: Once | INTRAMUSCULAR | Status: AC
  Administered 2018-01-15: 1000 ug via INTRAMUSCULAR
  Filled 2018-01-15: qty 1

## 2018-01-15 MED ORDER — IRON SUCROSE 20 MG/ML IV SOLN
200.0000 mg | Freq: Once | INTRAVENOUS | Status: AC
  Administered 2018-01-15: 200 mg via INTRAVENOUS
  Filled 2018-01-15: qty 10

## 2018-01-15 NOTE — Progress Notes (Signed)
Pt tolerated infusion well. Pt and VS stable at discharge.  

## 2018-01-20 ENCOUNTER — Other Ambulatory Visit: Payer: Self-pay | Admitting: Obstetrics and Gynecology

## 2018-01-20 DIAGNOSIS — B373 Candidiasis of vulva and vagina: Secondary | ICD-10-CM

## 2018-01-20 DIAGNOSIS — B3731 Acute candidiasis of vulva and vagina: Secondary | ICD-10-CM

## 2018-01-20 MED ORDER — FLUCONAZOLE 150 MG PO TABS: 150.0000 mg | ORAL_TABLET | ORAL | 0 refills | Status: AC

## 2018-01-20 NOTE — Progress Notes (Signed)
Discussed on phone yeast+, prescription sent to pharmacy.

## 2018-01-21 ENCOUNTER — Ambulatory Visit (INDEPENDENT_AMBULATORY_CARE_PROVIDER_SITE_OTHER): Payer: Medicaid Other

## 2018-01-21 ENCOUNTER — Ambulatory Visit (INDEPENDENT_AMBULATORY_CARE_PROVIDER_SITE_OTHER): Payer: Medicaid Other | Admitting: Obstetrics & Gynecology

## 2018-01-21 ENCOUNTER — Encounter: Payer: Self-pay | Admitting: Obstetrics & Gynecology

## 2018-01-21 VITALS — BP 100/60 | Wt 203.0 lb

## 2018-01-21 DIAGNOSIS — O3663X0 Maternal care for excessive fetal growth, third trimester, not applicable or unspecified: Secondary | ICD-10-CM

## 2018-01-21 DIAGNOSIS — O99013 Anemia complicating pregnancy, third trimester: Secondary | ICD-10-CM

## 2018-01-21 DIAGNOSIS — O0972 Supervision of high risk pregnancy due to social problems, second trimester: Secondary | ICD-10-CM

## 2018-01-21 DIAGNOSIS — O26843 Uterine size-date discrepancy, third trimester: Secondary | ICD-10-CM

## 2018-01-21 DIAGNOSIS — O0993 Supervision of high risk pregnancy, unspecified, third trimester: Secondary | ICD-10-CM

## 2018-01-21 DIAGNOSIS — Z3A30 30 weeks gestation of pregnancy: Secondary | ICD-10-CM

## 2018-01-21 DIAGNOSIS — D508 Other iron deficiency anemias: Secondary | ICD-10-CM

## 2018-01-21 DIAGNOSIS — O09523 Supervision of elderly multigravida, third trimester: Secondary | ICD-10-CM

## 2018-01-21 DIAGNOSIS — O09522 Supervision of elderly multigravida, second trimester: Secondary | ICD-10-CM

## 2018-01-21 LAB — POCT URINALYSIS DIPSTICK OB
Glucose, UA: NEGATIVE
POC,PROTEIN,UA: NEGATIVE

## 2018-01-21 NOTE — Addendum Note (Signed)
Addended by: Cornelius Moras D on: 01/21/2018 11:55 AM   Modules accepted: Orders

## 2018-01-21 NOTE — Patient Instructions (Signed)
Third Trimester of Pregnancy The third trimester is from week 28 through week 40 (months 7 through 9). The third trimester is a time when the unborn baby (fetus) is growing rapidly. At the end of the ninth month, the fetus is about 20 inches in length and weighs 6-10 pounds. Body changes during your third trimester Your body will continue to go through many changes during pregnancy. The changes vary from woman to woman. During the third trimester:  Your weight will continue to increase. You can expect to gain 25-35 pounds (11-16 kg) by the end of the pregnancy.  You may begin to get stretch marks on your hips, abdomen, and breasts.  You may urinate more often because the fetus is moving lower into your pelvis and pressing on your bladder.  You may develop or continue to have heartburn. This is caused by increased hormones that slow down muscles in the digestive tract.  You may develop or continue to have constipation because increased hormones slow digestion and cause the muscles that push waste through your intestines to relax.  You may develop hemorrhoids. These are swollen veins (varicose veins) in the rectum that can itch or be painful.  You may develop swollen, bulging veins (varicose veins) in your legs.  You may have increased body aches in the pelvis, back, or thighs. This is due to weight gain and increased hormones that are relaxing your joints.  You may have changes in your hair. These can include thickening of your hair, rapid growth, and changes in texture. Some women also have hair loss during or after pregnancy, or hair that feels dry or thin. Your hair will most likely return to normal after your baby is born.  Your breasts will continue to grow and they will continue to become tender. A yellow fluid (colostrum) may leak from your breasts. This is the first milk you are producing for your baby.  Your belly button may stick out.  You may notice more swelling in your hands,  face, or ankles.  You may have increased tingling or numbness in your hands, arms, and legs. The skin on your belly may also feel numb.  You may feel short of breath because of your expanding uterus.  You may have more problems sleeping. This can be caused by the size of your belly, increased need to urinate, and an increase in your body's metabolism.  You may notice the fetus "dropping," or moving lower in your abdomen (lightening).  You may have increased vaginal discharge.  You may notice your joints feel loose and you may have pain around your pelvic bone. What to expect at prenatal visits You will have prenatal exams every 2 weeks until week 36. Then you will have weekly prenatal exams. During a routine prenatal visit:  You will be weighed to make sure you and the baby are growing normally.  Your blood pressure will be taken.  Your abdomen will be measured to track your baby's growth.  The fetal heartbeat will be listened to.  Any test results from the previous visit will be discussed.  You may have a cervical check near your due date to see if your cervix has softened or thinned (effaced).  You will be tested for Group B streptococcus. This happens between 35 and 37 weeks. Your health care provider may ask you:  What your birth plan is.  How you are feeling.  If you are feeling the baby move.  If you have had any abnormal   symptoms, such as leaking fluid, bleeding, severe headaches, or abdominal cramping.  If you are using any tobacco products, including cigarettes, chewing tobacco, and electronic cigarettes.  If you have any questions. Other tests or screenings that may be performed during your third trimester include:  Blood tests that check for low iron levels (anemia).  Fetal testing to check the health, activity level, and growth of the fetus. Testing is done if you have certain medical conditions or if there are problems during the pregnancy.  Nonstress test  (NST). This test checks the health of your baby to make sure there are no signs of problems, such as the baby not getting enough oxygen. During this test, a belt is placed around your belly. The baby is made to move, and its heart rate is monitored during movement. What is false labor? False labor is a condition in which you feel small, irregular tightenings of the muscles in the womb (contractions) that usually go away with rest, changing position, or drinking water. These are called Braxton Hicks contractions. Contractions may last for hours, days, or even weeks before true labor sets in. If contractions come at regular intervals, become more frequent, increase in intensity, or become painful, you should see your health care provider. What are the signs of labor?  Abdominal cramps.  Regular contractions that start at 10 minutes apart and become stronger and more frequent with time.  Contractions that start on the top of the uterus and spread down to the lower abdomen and back.  Increased pelvic pressure and dull back pain.  A watery or bloody mucus discharge that comes from the vagina.  Leaking of amniotic fluid. This is also known as your "water breaking." It could be a slow trickle or a gush. Let your health care provider know if it has a color or strange odor. If you have any of these signs, call your health care provider right away, even if it is before your due date. Follow these instructions at home: Medicines  Follow your health care provider's instructions regarding medicine use. Specific medicines may be either safe or unsafe to take during pregnancy.  Take a prenatal vitamin that contains at least 600 micrograms (mcg) of folic acid.  If you develop constipation, try taking a stool softener if your health care provider approves. Eating and drinking   Eat a balanced diet that includes fresh fruits and vegetables, whole grains, good sources of protein such as meat, eggs, or tofu,  and low-fat dairy. Your health care provider will help you determine the amount of weight gain that is right for you.  Avoid raw meat and uncooked cheese. These carry germs that can cause birth defects in the baby.  If you have low calcium intake from food, talk to your health care provider about whether you should take a daily calcium supplement.  Eat four or five small meals rather than three large meals a day.  Limit foods that are high in fat and processed sugars, such as fried and sweet foods.  To prevent constipation: ? Drink enough fluid to keep your urine clear or pale yellow. ? Eat foods that are high in fiber, such as fresh fruits and vegetables, whole grains, and beans. Activity  Exercise only as directed by your health care provider. Most women can continue their usual exercise routine during pregnancy. Try to exercise for 30 minutes at least 5 days a week. Stop exercising if you experience uterine contractions.  Avoid heavy lifting.  Do   not exercise in extreme heat or humidity, or at high altitudes.  Wear low-heel, comfortable shoes.  Practice good posture.  You may continue to have sex unless your health care provider tells you otherwise. Relieving pain and discomfort  Take frequent breaks and rest with your legs elevated if you have leg cramps or low back pain.  Take warm sitz baths to soothe any pain or discomfort caused by hemorrhoids. Use hemorrhoid cream if your health care provider approves.  Wear a good support bra to prevent discomfort from breast tenderness.  If you develop varicose veins: ? Wear support pantyhose or compression stockings as told by your healthcare provider. ? Elevate your feet for 15 minutes, 3-4 times a day. Prenatal care  Write down your questions. Take them to your prenatal visits.  Keep all your prenatal visits as told by your health care provider. This is important. Safety  Wear your seat belt at all times when driving.  Make  a list of emergency phone numbers, including numbers for family, friends, the hospital, and police and fire departments. General instructions  Avoid cat litter boxes and soil used by cats. These carry germs that can cause birth defects in the baby. If you have a cat, ask someone to clean the litter box for you.  Do not travel far distances unless it is absolutely necessary and only with the approval of your health care provider.  Do not use hot tubs, steam rooms, or saunas.  Do not drink alcohol.  Do not use any products that contain nicotine or tobacco, such as cigarettes and e-cigarettes. If you need help quitting, ask your health care provider.  Do not use any medicinal herbs or unprescribed drugs. These chemicals affect the formation and growth of the baby.  Do not douche or use tampons or scented sanitary pads.  Do not cross your legs for long periods of time.  To prepare for the arrival of your baby: ? Take prenatal classes to understand, practice, and ask questions about labor and delivery. ? Make a trial run to the hospital. ? Visit the hospital and tour the maternity area. ? Arrange for maternity or paternity leave through employers. ? Arrange for family and friends to take care of pets while you are in the hospital. ? Purchase a rear-facing car seat and make sure you know how to install it in your car. ? Pack your hospital bag. ? Prepare the baby's nursery. Make sure to remove all pillows and stuffed animals from the baby's crib to prevent suffocation.  Visit your dentist if you have not gone during your pregnancy. Use a soft toothbrush to brush your teeth and be gentle when you floss. Contact a health care provider if:  You are unsure if you are in labor or if your water has broken.  You become dizzy.  You have mild pelvic cramps, pelvic pressure, or nagging pain in your abdominal area.  You have lower back pain.  You have persistent nausea, vomiting, or  diarrhea.  You have an unusual or bad smelling vaginal discharge.  You have pain when you urinate. Get help right away if:  Your water breaks before 37 weeks.  You have regular contractions less than 5 minutes apart before 37 weeks.  You have a fever.  You are leaking fluid from your vagina.  You have spotting or bleeding from your vagina.  You have severe abdominal pain or cramping.  You have rapid weight loss or weight gain.  You have   shortness of breath with chest pain.  You notice sudden or extreme swelling of your face, hands, ankles, feet, or legs.  Your baby makes fewer than 10 movements in 2 hours.  You have severe headaches that do not go away when you take medicine.  You have vision changes. Summary  The third trimester is from week 28 through week 40, months 7 through 9. The third trimester is a time when the unborn baby (fetus) is growing rapidly.  During the third trimester, your discomfort may increase as you and your baby continue to gain weight. You may have abdominal, leg, and back pain, sleeping problems, and an increased need to urinate.  During the third trimester your breasts will keep growing and they will continue to become tender. A yellow fluid (colostrum) may leak from your breasts. This is the first milk you are producing for your baby.  False labor is a condition in which you feel small, irregular tightenings of the muscles in the womb (contractions) that eventually go away. These are called Braxton Hicks contractions. Contractions may last for hours, days, or even weeks before true labor sets in.  Signs of labor can include: abdominal cramps; regular contractions that start at 10 minutes apart and become stronger and more frequent with time; watery or bloody mucus discharge that comes from the vagina; increased pelvic pressure and dull back pain; and leaking of amniotic fluid. This information is not intended to replace advice given to you by your  health care provider. Make sure you discuss any questions you have with your health care provider. Document Released: 12/12/2000 Document Revised: 01/24/2016 Document Reviewed: 01/24/2016 Elsevier Interactive Patient Education  2019 [employer]  

## 2018-01-21 NOTE — Progress Notes (Signed)
  Subjective  Fetal Movement? yes Contractions? no Leaking Fluid? yes Vaginal Bleeding? not applicable  Objective  BP 100/60   Wt 203 lb (92.1 kg)   LMP 06/20/2017 (Exact Date)   BMI 32.77 kg/m  General: NAD Pumonary: no increased work of breathing Abdomen: gravid, non-tender Extremities: no edema Psychiatric: mood appropriate, affect full  Assessment  38 y.o. [id number] at 6432w5d by  03/27/2018, by Last Menstrual Period presenting for routine prenatal visit  Plan   Problem List Items Addressed This Visit      Other   AMA (advanced maternal age) multigravida 35+, second trimester   High-risk pregnancy, third trimester    Valtrex 36 weeks    US for growth again at 34 weeks, monitor LGA    3 hour GTT normal    PAP- further address post partum   Iron deficiency anemia - Primary   CBC   [redacted] weeks gestation of pregnancy            PNV, FMC, PTL precautions    Review of ULTRASOUND.    [facility] have personally reviewed images and [facility]    Plan of management to be discussed with patient.  Annamarie MajorPaul Taniah Reinecke, MD, Merlinda FrederickFACOG [facility], [facility] 01/21/2018  10:58 AM

## 2018-01-22 ENCOUNTER — Inpatient Hospital Stay: Payer: Medicaid Other

## 2018-01-22 VITALS — BP 100/66 | HR 88 | Resp 18

## 2018-01-22 DIAGNOSIS — D508 Other iron deficiency anemias: Secondary | ICD-10-CM | POA: Diagnosis not present

## 2018-01-22 LAB — CBC
Hematocrit: 32.4 % — ABNORMAL LOW (ref 34.0–46.6)
Hemoglobin: 10.6 g/dL — ABNORMAL LOW (ref 11.1–15.9)
MCH: 31 pg (ref 26.6–33.0)
MCHC: 32.7 g/dL (ref 31.5–35.7)
MCV: 95 fL (ref 79–97)
Platelets: 298 10*3/uL (ref 150–450)
RBC: 3.42 x10E6/uL — ABNORMAL LOW (ref 3.77–5.28)
RDW: 13.5 % (ref 11.7–15.4)
WBC: 7.5 10*3/uL (ref 3.4–10.8)

## 2018-01-22 MED ORDER — SODIUM CHLORIDE 0.9 % IV SOLN
Freq: Once | INTRAVENOUS | Status: AC
  Administered 2018-01-22: 14:00:00 via INTRAVENOUS
  Filled 2018-01-22: qty 250

## 2018-01-22 MED ORDER — CYANOCOBALAMIN 1000 MCG/ML IJ SOLN: 1000.0000 ug | Freq: Once | INTRAMUSCULAR | Status: DC

## 2018-01-22 MED ORDER — IRON SUCROSE 20 MG/ML IV SOLN
200.0000 mg | Freq: Once | INTRAVENOUS | Status: AC
  Administered 2018-01-22: 200 mg via INTRAVENOUS
  Filled 2018-01-22: qty 10

## 2018-01-23 ENCOUNTER — Ambulatory Visit (INDEPENDENT_AMBULATORY_CARE_PROVIDER_SITE_OTHER): Payer: Medicaid Other | Admitting: Certified Nurse Midwife

## 2018-01-23 VITALS — BP 80/60 | Wt 200.0 lb

## 2018-01-23 DIAGNOSIS — R102 Pelvic and perineal pain: Secondary | ICD-10-CM

## 2018-01-23 DIAGNOSIS — R109 Unspecified abdominal pain: Secondary | ICD-10-CM

## 2018-01-23 DIAGNOSIS — R350 Frequency of micturition: Secondary | ICD-10-CM

## 2018-01-23 DIAGNOSIS — O26893 Other specified pregnancy related conditions, third trimester: Secondary | ICD-10-CM

## 2018-01-23 DIAGNOSIS — Z3A31 31 weeks gestation of pregnancy: Secondary | ICD-10-CM

## 2018-01-23 LAB — POCT URINALYSIS DIPSTICK OB: Glucose, UA: NEGATIVE

## 2018-01-23 NOTE — Progress Notes (Signed)
Problem visit at [redacted] weeks gestation: sudden onset of constant pelvic and sharp bilateral lower abdominal pains last night. Can't get in a comfortable position, sitting or standing. Feels like everything is going to fall out. Baby active. No dysuria, but increased urinary frequency. No vaginal bleeding. No leakage of fluid. Has an occasional contraction. Urine dipstick: positive for +1 to +2 protein, no blood, no leukocytes, no nitrite.  BP 80/60. FHTs WNL Had another iron infusion yesterday Ultrasound: cephalic presentation (was transverse) Cervix: firm/ closed/ -3/ 40% A: Pelvic pain possibly from fetal position Round ligament pain NO evidence of preterm labor. P: Discussed use of maternity support garment and other comfort measures: Tylenol (has not been effective) Pelvic rocks Soaking in a tub Follow up as scheduled for ROB.

## 2018-01-25 LAB — URINE CULTURE

## 2018-01-29 ENCOUNTER — Inpatient Hospital Stay: Payer: Medicaid Other

## 2018-01-29 VITALS — BP 100/65 | HR 98 | Temp 97.6°F | Resp 18

## 2018-01-29 DIAGNOSIS — D508 Other iron deficiency anemias: Secondary | ICD-10-CM

## 2018-01-29 MED ORDER — SODIUM CHLORIDE 0.9 % IV SOLN
Freq: Once | INTRAVENOUS | Status: AC
  Administered 2018-01-29: 14:00:00 via INTRAVENOUS
  Filled 2018-01-29: qty 250

## 2018-01-29 MED ORDER — CYANOCOBALAMIN 1000 MCG/ML IJ SOLN
1000.0000 ug | Freq: Once | INTRAMUSCULAR | Status: DC
  Filled 2018-01-29: qty 1

## 2018-01-29 MED ORDER — IRON SUCROSE 20 MG/ML IV SOLN
200.0000 mg | Freq: Once | INTRAVENOUS | Status: AC
  Administered 2018-01-29: 200 mg via INTRAVENOUS
  Filled 2018-01-29: qty 5

## 2018-02-03 ENCOUNTER — Encounter: Payer: Self-pay | Admitting: Obstetrics and Gynecology

## 2018-02-03 ENCOUNTER — Ambulatory Visit (INDEPENDENT_AMBULATORY_CARE_PROVIDER_SITE_OTHER): Payer: Medicaid Other | Admitting: Obstetrics and Gynecology

## 2018-02-03 VITALS — BP 102/64 | Wt 201.0 lb

## 2018-02-03 DIAGNOSIS — Z3A32 32 weeks gestation of pregnancy: Secondary | ICD-10-CM

## 2018-02-03 DIAGNOSIS — O09522 Supervision of elderly multigravida, second trimester: Secondary | ICD-10-CM

## 2018-02-03 DIAGNOSIS — O09523 Supervision of elderly multigravida, third trimester: Secondary | ICD-10-CM

## 2018-02-03 DIAGNOSIS — O0993 Supervision of high risk pregnancy, unspecified, third trimester: Secondary | ICD-10-CM

## 2018-02-03 NOTE — Progress Notes (Signed)
Routine Prenatal Care Visit  Subjective  Kristen ChuJennifer Herendon is a 38 y.o. [id number] at 923w4d being seen today for ongoing prenatal care.  She is currently monitored for the following issues for this high-risk pregnancy and has History of gestational diabetes in prior pregnancy, currently pregnant in third trimester; AMA (advanced maternal age) multigravida 35+, second trimester; High-risk pregnancy, third trimester; Encounter for genetic screening for Down Syndrome; Anxiety and depression; Vaginal bleeding affecting early pregnancy; Cystic fibrosis carrier; HSIL on Pap smear of cervix; Abdominal pain affecting pregnancy, antepartum; [redacted] weeks gestation of pregnancy; Indication for care in labor and delivery, antepartum; Hypotension; and Iron deficiency anemia on their problem list.  ----------------------------------------------------------------------------------- Patient reports fatigue.   Contractions: Not present. Vag. Bleeding: None.  Movement: (!) Decreased. Denies leaking of fluid.  ----------------------------------------------------------------------------------- The following portions of the patient's history were reviewed and updated as appropriate: allergies, current medications, past family history, past medical history, past social history, past surgical history and problem list. Problem list updated.   Objective  Blood pressure 102/64, weight 201 lb (91.2 kg), last menstrual period 06/20/2017. Pregravid weight 168 lb (76.2 kg) Total Weight Gain 33 lb (15 kg) Urinalysis:      Fetal Status: Fetal Heart Rate (bpm): 145 Fundal Height: 36 cm Movement: (!) Decreased     General:  Alert, oriented and cooperative. Patient is in no acute distress.  Skin: Skin is warm and dry. No rash noted.   Cardiovascular: Normal heart rate noted  Respiratory: Normal respiratory effort, no problems with respiration noted  Abdomen: Soft, gravid, appropriate for gestational age. Pain/Pressure: Present      Pelvic:  Cervical exam deferred        Extremities: Normal range of motion.  Edema: None  Mental Status: Normal mood and affect. Normal behavior. Normal judgment and thought content.     Assessment   38 y.o. [id number] at [redacted]w[redacted]d by  03/27/2018, by Last Menstrual Period presenting for routine prenatal visit  Plan   Pregnancy #5 Problems (from 06/20/17 to present)    Problem Noted Resolved   Cystic fibrosis carrier 10/07/2017 by Rosalva FerronBaric, Michelle No   Overview Signed 10/07/2017 10:48 AM by Rosalva FerronBaric, Michelle    10/07/17 GC for CF carrier status.  Couple undecided regarding paternal CF carrier screening.       Vaginal bleeding affecting early pregnancy 09/05/2017 by Conard NovakJackson, Stephen D, MD No   History of gestational diabetes in prior pregnancy, currently pregnant in third trimester 08/29/2017 by Natale MilchSchuman, Mattilynn Forrer R, MD No   AMA (advanced maternal age) multigravida 35+, second trimester 08/29/2017 by Natale MilchSchuman, Torrey Horseman R, MD No   High-risk pregnancy, third trimester 08/29/2017 by Natale MilchSchuman, Keiton Cosma R, MD No   Overview Addendum 12/06/2017  3:38 PM by Farrel ConnersGutierrez, Colleen, CNM    [facility] Prenatal Labs  Dating [place] Blood type: A/Positive/-- (07/19 0000) A +  Genetic Screen NIPS: Normal 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 Antibody:   Anatomic US  Rubella: Immune (07/19 0000)  Varicella: Immune  GTT Early: WNLThird trimester:  RPR: Nonreactive (07/19 0000)   Rhogam  not needed HBsAg: Negative (07/19 0000)   TDaP vaccine                        Flu Shot: Declines HIV: Non-reactive (07/19 0000)   Baby Food  GBS:   Contraception  Mirena? Pap: HSIL HPV +  CBB     CS/VBAC  HSV+ needs valtrex at 36 weeks [ ]   Support Person              Gestational age appropriate obstetric precautions including but not limited to vaginal bleeding, contractions, leaking of fluid and fetal movement were reviewed in detail with the patient.    Discussed  adding B1, B6, and vitamin D3 Patient continuing with iron infusions NST reactive today- discussed kick counts   Return in about 2 weeks (around 02/17/2018) for ROB and US.  Natale Milchhristanna R Amaan Meyer MD [facility], [facility] 02/03/2018, 3:04 PM

## 2018-02-03 NOTE — Progress Notes (Signed)
ROB C/o baby has moved only once today, braxton hicks contractions regularly, nausea and vomiting still occurring regularly  Poss leaking fluid

## 2018-02-04 ENCOUNTER — Encounter: Payer: Medicaid Other | Admitting: Obstetrics & Gynecology

## 2018-02-05 ENCOUNTER — Inpatient Hospital Stay: Payer: Medicaid Other | Attending: Oncology

## 2018-02-05 VITALS — BP 101/67 | HR 90 | Resp 20

## 2018-02-05 DIAGNOSIS — D508 Other iron deficiency anemias: Secondary | ICD-10-CM | POA: Diagnosis not present

## 2018-02-05 MED ORDER — CYANOCOBALAMIN 1000 MCG/ML IJ SOLN: 1000.0000 ug | Freq: Once | INTRAMUSCULAR | Status: AC

## 2018-02-05 MED ORDER — IRON SUCROSE 20 MG/ML IV SOLN
200.0000 mg | Freq: Once | INTRAVENOUS | Status: AC
  Administered 2018-02-05: 200 mg via INTRAVENOUS
  Filled 2018-02-05: qty 10

## 2018-02-05 MED ORDER — SODIUM CHLORIDE 0.9 % IV SOLN
Freq: Once | INTRAVENOUS | Status: AC
  Administered 2018-02-05: 14:00:00 via INTRAVENOUS
  Filled 2018-02-05: qty 250

## 2018-02-05 MED ORDER — CYANOCOBALAMIN 1000 MCG/ML IJ SOLN
1000.0000 ug | Freq: Once | INTRAMUSCULAR | Status: DC
  Filled 2018-02-05: qty 1

## 2018-02-12 ENCOUNTER — Inpatient Hospital Stay: Payer: Medicaid Other

## 2018-02-13 ENCOUNTER — Telehealth: Payer: Self-pay

## 2018-02-13 NOTE — Telephone Encounter (Signed)
Pt called c/o wetting herself c walking, not staying dry.  [phone]  Adv pt to place pad on and ck it in one hour and call back.  P/c to pt.  States pad isn't dry.  Adv to go to L&D via ED.  [place] aware.

## 2018-02-17 ENCOUNTER — Observation Stay
Admission: EM | Admit: 2018-02-17 | Discharge: 2018-02-17 | Disposition: A | Discharge status: Home / Self Care | Payer: Medicaid Other | Attending: Obstetrics & Gynecology | Admitting: Obstetrics & Gynecology

## 2018-02-17 ENCOUNTER — Encounter: Payer: Medicaid Other | Admitting: Obstetrics and Gynecology

## 2018-02-17 ENCOUNTER — Other Ambulatory Visit: Payer: Self-pay

## 2018-02-17 DIAGNOSIS — Z8632 Personal history of gestational diabetes: Secondary | ICD-10-CM

## 2018-02-17 DIAGNOSIS — O208 Other hemorrhage in early pregnancy: Secondary | ICD-10-CM

## 2018-02-17 DIAGNOSIS — O209 Hemorrhage in early pregnancy, unspecified: Secondary | ICD-10-CM

## 2018-02-17 DIAGNOSIS — O09293 Supervision of pregnancy with other poor reproductive or obstetric history, third trimester: Secondary | ICD-10-CM

## 2018-02-17 DIAGNOSIS — N898 Other specified noninflammatory disorders of vagina: Secondary | ICD-10-CM | POA: Diagnosis not present

## 2018-02-17 DIAGNOSIS — Z141 Cystic fibrosis carrier: Secondary | ICD-10-CM

## 2018-02-17 DIAGNOSIS — Z3A34 34 weeks gestation of pregnancy: Secondary | ICD-10-CM

## 2018-02-17 DIAGNOSIS — O09522 Supervision of elderly multigravida, second trimester: Secondary | ICD-10-CM

## 2018-02-17 DIAGNOSIS — Z87891 Personal history of nicotine dependence: Secondary | ICD-10-CM | POA: Diagnosis not present

## 2018-02-17 DIAGNOSIS — O26893 Other specified pregnancy related conditions, third trimester: Secondary | ICD-10-CM | POA: Diagnosis not present

## 2018-02-17 DIAGNOSIS — O0993 Supervision of high risk pregnancy, unspecified, third trimester: Secondary | ICD-10-CM

## 2018-02-17 LAB — FETAL FIBRONECTIN: Fetal Fibronectin: NEGATIVE

## 2018-02-17 LAB — WET PREP, GENITAL
Clue Cells Wet Prep HPF POC: NONE SEEN
Sperm: NONE SEEN
Trich, Wet Prep: NONE SEEN
Yeast Wet Prep HPF POC: NONE SEEN

## 2018-02-17 LAB — ROM PLUS (ARMC ONLY): Rom Plus: NEGATIVE

## 2018-02-17 NOTE — Progress Notes (Signed)
D/C instructions reviewed with pt. Pt encouraged to return with LOF, vaginal bleeding, ctx, and decreased fetal movement. Pt verbalized understanding. Nurse walked pt out for d/c.

## 2018-02-17 NOTE — OB Triage Note (Signed)
Pt G5P5 complains of LOF since 1000 this AM. She states the fluid started running down her leg. She does not remember consistency of fluid, but states it was clear in appearance. Pt denies ctx and states she feels + FM. VSS. Monitors applied and assessing.

## 2018-02-17 NOTE — Final Progress Note (Signed)
Physician Final Progress Note  Patient ID: Kristen Mcpherson MRN: [id number] DOB/AGE: Aug 13, 1980 38 y.o.  Admit date: 02/17/2018 Admitting provider: Nadara Mustard, MD Discharge date: 02/17/2018  Admission Diagnoses:  Indication for care in labor and delivery, antepartum  Discharge Diagnoses: Supervision of high risk pregnancy  History of Present Illness: The patient is a 38 y.o. female [id number] at [redacted]w[redacted]d who presents for possible loss of amniotic fluid. She reports feeling some clear fluid running down her leg about 1000 this morning. She has not felt this again but was concerned for ROM. She has not been having contractions or vaginal bleeding. She reports good fetal movement.  Review of Systems: Review of systems negative unless otherwise noted in HPI.   Past Medical History:  Diagnosis Date  . Anemia   . Anxiety   . Asthma   . Iron deficiency anemia 01/08/2018    Past Surgical History:  Procedure Laterality Date  . APPENDECTOMY      No current facility-administered medications on file prior to encounter.    Current Outpatient Medications on File Prior to Encounter  Medication Sig Dispense Refill  . butalbital-acetaminophen-caffeine (FIORICET, ESGIC) 50-325-40 MG tablet Take 1-2 tablets by mouth every 6 (six) hours as needed for headache. 20 tablet 0  . esomeprazole (NEXIUM) 20 MG capsule Take 1 capsule (20 mg total) by mouth daily. 30 capsule 1  . Prenat w/o A-FeCbGl-DSS-FA-DHA (CITRANATAL ASSURE) 35-1 & 300 MG tablet Take 2 tablets by mouth daily. 60 tablet 11  . ondansetron (ZOFRAN ODT) 4 MG disintegrating tablet Take 1 tablet (4 mg total) by mouth every 6 (six) hours as needed for nausea. (Patient not taking: Reported on 01/13/2018) 20 tablet 0    No Known Allergies  Social History   Socioeconomic History  . Marital status: Single    Spouse name: Not on file  . Number of children: Not on file  . Years of education: Not on file  . Highest education level: Not on file   Occupational History  . Not on file  Social Needs  . Financial resource strain: Not hard at all  . Food insecurity:    Worry: Never true    Inability: Never true  . Transportation needs:    Medical: No    Non-medical: No  Tobacco Use  . Smoking status: Former Smoker    Last attempt to quit: 01/08/2017    Years since quitting: 1.1  . Smokeless tobacco: Never Used  Substance and Sexual Activity  . Alcohol use: Not Currently  . Drug use: Never  . Sexual activity: Yes    Birth control/protection: Surgical    Comment: BTL  Lifestyle  . Physical activity:    Days per week: 0 days    Minutes per session: 0 min  . Stress: Not at all  Relationships  . Social connections:    Talks on phone: More than three times a week    Gets together: More than three times a week    Attends religious service: Never    Active member of club or organization: No    Attends meetings of clubs or organizations: Never    Relationship status: Not on file  . Intimate partner violence:    Fear of current or ex partner: No    Emotionally abused: No    Physically abused: No    Forced sexual activity: No  Other Topics Concern  . Not on file  Social History Narrative  . Not on file  Family History  Problem Relation Age of Onset  . Diabetes Mother   . Arthritis Mother   . Depression Mother   . Anxiety disorder Mother   . Anemia Mother   . Hyperlipidemia Father   . Depression Father   . ADD / ADHD Brother     Physical Exam: BP 90/62 (BP Location: Left Arm)   Pulse (!) 107   Temp 98.3 F (36.8 C) (Oral)   Resp 16   Ht 5\' 6"  (1.676 m)   Wt 90.7 kg   LMP 06/20/2017 (Exact Date)   BMI 32.28 kg/m   Gen: NAD CV: Mild tachycardia Pulm: No increased work of breathing Pelvic: SSE: cervix visually closed, no pooling, moderate amount of thin white discharge, negative whiff, ferning negative on microscopy Ext: no signs of DVT  NST:  Baseline: 135 Variability: moderate Accelerations:  present Decelerations: absent Tocometry: occasional uterine irritability The patient was monitored for 30+ minutes, fetal heart rate tracing was deemed reactive.  Consults: None  Significant Findings/ Diagnostic Studies: labs: ROM Plus negative, fetal fibronectin negative, wet prep negative  Procedures: SSE, NST  Discharge Condition: good  Disposition: Discharge disposition: 01-Home or Self Care       Diet: Regular diet  Discharge Activity: Activity as tolerated  Discharge Instructions    Discharge activity:  No Restrictions   Complete by:  As directed    Discharge diet:  No restrictions   Complete by:  As directed    Fetal Kick Count:  Lie on our left side for one hour after a meal, and count the number of times your baby kicks.  If it is less than 5 times, get up, move around and drink some juice.  Repeat the test 30 minutes later.  If it is still less than 5 kicks in an hour, notify your doctor.   Complete by:  As directed    LABOR:  When conractions begin, you should start to time them from the beginning of one contraction to the beginning  of the next.  When contractions are 5 - 10 minutes apart or less and have been regular for at least an hour, you should call your health care provider.   Complete by:  As directed    No sexual activity restrictions   Complete by:  As directed    Notify physician for bleeding from the vagina   Complete by:  As directed    Notify physician for blurring of vision or spots before the eyes   Complete by:  As directed    Notify physician for chills or fever   Complete by:  As directed    Notify physician for fainting spells, "black outs" or loss of consciousness   Complete by:  As directed    Notify physician for increase in vaginal discharge   Complete by:  As directed    Notify physician for leaking of fluid   Complete by:  As directed    Notify physician for pain or burning when urinating   Complete by:  As directed    Notify  physician for pelvic pressure (sudden increase)   Complete by:  As directed    Notify physician for severe or continued nausea or vomiting   Complete by:  As directed    Notify physician for sudden gushing of fluid from the vagina (with or without continued leaking)   Complete by:  As directed    Notify physician for sudden, constant, or occasional abdominal pain  Complete by:  As directed    Notify physician if baby moving less than usual   Complete by:  As directed      Allergies as of 02/17/2018   No Known Allergies     Medication List    TAKE these medications   butalbital-acetaminophen-caffeine 50-325-40 MG tablet Commonly known as:  FIORICET, ESGIC Take 1-2 tablets by mouth every 6 (six) hours as needed for headache.   CITRANATAL ASSURE 35-1 & 300 MG tablet Take 2 tablets by mouth daily.   esomeprazole 20 MG capsule Commonly known as:  NEXIUM Take 1 capsule (20 mg total) by mouth daily.   ondansetron 4 MG disintegrating tablet Commonly known as:  ZOFRAN ODT Take 1 tablet (4 mg total) by mouth every 6 (six) hours as needed for nausea.      Negative for ROM and infections. Keep scheduled ROB appointment.  Signed: Oswaldo Conroy, CNM  02/17/2018, 3:00 PM

## 2018-02-17 NOTE — Discharge Summary (Signed)
See Final Progress Note 02/27/2018.

## 2018-02-18 ENCOUNTER — Ambulatory Visit (INDEPENDENT_AMBULATORY_CARE_PROVIDER_SITE_OTHER): Payer: Medicaid Other

## 2018-02-18 ENCOUNTER — Ambulatory Visit (INDEPENDENT_AMBULATORY_CARE_PROVIDER_SITE_OTHER): Payer: Medicaid Other | Admitting: Certified Nurse Midwife

## 2018-02-18 VITALS — BP 104/60 | Wt 205.0 lb

## 2018-02-18 DIAGNOSIS — O3663X0 Maternal care for excessive fetal growth, third trimester, not applicable or unspecified: Secondary | ICD-10-CM | POA: Diagnosis not present

## 2018-02-18 DIAGNOSIS — O321XX0 Maternal care for breech presentation, not applicable or unspecified: Secondary | ICD-10-CM

## 2018-02-18 DIAGNOSIS — Z3A34 34 weeks gestation of pregnancy: Secondary | ICD-10-CM | POA: Diagnosis not present

## 2018-02-18 DIAGNOSIS — O09523 Supervision of elderly multigravida, third trimester: Secondary | ICD-10-CM

## 2018-02-18 DIAGNOSIS — O0993 Supervision of high risk pregnancy, unspecified, third trimester: Secondary | ICD-10-CM

## 2018-02-18 DIAGNOSIS — O09522 Supervision of elderly multigravida, second trimester: Secondary | ICD-10-CM

## 2018-02-18 LAB — POCT URINALYSIS DIPSTICK OB: Glucose, UA: NEGATIVE

## 2018-02-18 MED ORDER — VALACYCLOVIR HCL 500 MG PO TABS: 500.0000 mg | ORAL_TABLET | Freq: Two times a day (BID) | ORAL | 1 refills | Status: DC

## 2018-02-18 NOTE — Progress Notes (Signed)
ROB C/o Braxton hicks, was leaking "random fluid" at hospital yesterday, pain, pressure, some swelling, and headaches and nausea

## 2018-02-19 ENCOUNTER — Other Ambulatory Visit: Payer: Self-pay | Admitting: Oncology

## 2018-02-19 ENCOUNTER — Encounter: Payer: Self-pay | Admitting: Oncology

## 2018-02-19 ENCOUNTER — Inpatient Hospital Stay: Payer: Medicaid Other

## 2018-02-19 DIAGNOSIS — D508 Other iron deficiency anemias: Secondary | ICD-10-CM | POA: Diagnosis not present

## 2018-02-19 DIAGNOSIS — D519 Vitamin B12 deficiency anemia, unspecified: Secondary | ICD-10-CM

## 2018-02-19 LAB — CBC WITH DIFFERENTIAL/PLATELET
Abs Immature Granulocytes: 0.06 10*3/uL (ref 0.00–0.07)
Basophils Absolute: 0 10*3/uL (ref 0.0–0.1)
Basophils Relative: 0 %
Eosinophils Absolute: 0.2 10*3/uL (ref 0.0–0.5)
Eosinophils Relative: 2 %
HCT: 31.2 % — ABNORMAL LOW (ref 36.0–46.0)
Hemoglobin: 10.2 g/dL — ABNORMAL LOW (ref 12.0–15.0)
Immature Granulocytes: 1 %
Lymphocytes Relative: 17 %
Lymphs Abs: 1.4 10*3/uL (ref 0.7–4.0)
MCH: 31.4 pg (ref 26.0–34.0)
MCHC: 32.7 g/dL (ref 30.0–36.0)
MCV: 96 fL (ref 80.0–100.0)
Monocytes Absolute: 0.8 10*3/uL (ref 0.1–1.0)
Monocytes Relative: 10 %
Neutro Abs: 5.6 10*3/uL (ref 1.7–7.7)
Neutrophils Relative %: 70 %
Platelets: 200 10*3/uL (ref 150–400)
RBC: 3.25 MIL/uL — ABNORMAL LOW (ref 3.87–5.11)
RDW: 15.3 % (ref 11.5–15.5)
WBC: 8 10*3/uL (ref 4.0–10.5)
nRBC: 0 % (ref 0.0–0.2)

## 2018-02-19 LAB — IRON AND TIBC
Iron: 90 ug/dL (ref 28–170)
Saturation Ratios: 21 % (ref 10.4–31.8)
TIBC: 420 ug/dL (ref 250–450)
UIBC: 330 ug/dL

## 2018-02-19 LAB — VITAMIN B12: Vitamin B-12: 157 pg/mL — ABNORMAL LOW (ref 180–914)

## 2018-02-19 LAB — FERRITIN: Ferritin: 148 ng/mL (ref 11–307)

## 2018-02-21 ENCOUNTER — Other Ambulatory Visit: Payer: Self-pay | Admitting: Maternal Newborn

## 2018-02-21 ENCOUNTER — Inpatient Hospital Stay: Payer: Medicaid Other | Admitting: Oncology

## 2018-02-21 ENCOUNTER — Inpatient Hospital Stay: Payer: Medicaid Other

## 2018-02-21 DIAGNOSIS — O99013 Anemia complicating pregnancy, third trimester: Secondary | ICD-10-CM

## 2018-02-23 ENCOUNTER — Other Ambulatory Visit: Payer: Self-pay

## 2018-02-23 ENCOUNTER — Observation Stay
Admission: EM | Admit: 2018-02-23 | Discharge: 2018-02-23 | Disposition: A | Discharge status: Home / Self Care | Payer: Medicaid Other | Attending: Maternal Newborn | Admitting: Maternal Newborn

## 2018-02-23 DIAGNOSIS — Z3A35 35 weeks gestation of pregnancy: Secondary | ICD-10-CM | POA: Insufficient documentation

## 2018-02-23 DIAGNOSIS — Z87891 Personal history of nicotine dependence: Secondary | ICD-10-CM | POA: Insufficient documentation

## 2018-02-23 DIAGNOSIS — O26893 Other specified pregnancy related conditions, third trimester: Secondary | ICD-10-CM | POA: Diagnosis not present

## 2018-02-23 DIAGNOSIS — R109 Unspecified abdominal pain: Secondary | ICD-10-CM | POA: Insufficient documentation

## 2018-02-23 DIAGNOSIS — O9A213 Injury, poisoning and certain other consequences of external causes complicating pregnancy, third trimester: Secondary | ICD-10-CM

## 2018-02-23 MED ORDER — ACETAMINOPHEN 500 MG PO TABS: 1000.0000 mg | ORAL_TABLET | ORAL | Status: DC | PRN
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
## 2018-02-23 NOTE — Discharge Summary (Signed)
See Final Progress Note 02/23/2018.
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
## 2018-02-25 ENCOUNTER — Inpatient Hospital Stay: Payer: Medicaid Other | Admitting: Oncology

## 2018-02-25 ENCOUNTER — Inpatient Hospital Stay: Payer: Medicaid Other

## 2018-02-25 NOTE — Progress Notes (Signed)
ROB at 34wk5d: Seen in L&D last night for leakage of fluid-negative for ROM. Reports pressure, BH contractions.  [place] today for S>D: breech/oblique presentation, EFW 80.6% (6#11oz) with AFI=16 cm FHT WNL.  Desires ppBTL-30 day papers signed today. Aware the tubal ligation is permanent birth control. Will start Valtrex PPX-RX sent to pharmacy for Valtrex 500 mgm BID RTO in 1-2 weeks for ultrasound for presentation  Discussed options if baby remains breech/ oblique: ECV, Cesarean section if malpresentation persists.   Farrel Conners, CNM

## 2018-02-26 ENCOUNTER — Encounter: Payer: Medicaid Other | Admitting: Obstetrics and Gynecology

## 2018-02-26 ENCOUNTER — Ambulatory Visit (INDEPENDENT_AMBULATORY_CARE_PROVIDER_SITE_OTHER): Payer: Medicaid Other | Admitting: Obstetrics and Gynecology

## 2018-02-26 ENCOUNTER — Ambulatory Visit (INDEPENDENT_AMBULATORY_CARE_PROVIDER_SITE_OTHER): Payer: Medicaid Other

## 2018-02-26 VITALS — BP 100/70 | Wt 206.0 lb

## 2018-02-26 DIAGNOSIS — O09523 Supervision of elderly multigravida, third trimester: Secondary | ICD-10-CM

## 2018-02-26 DIAGNOSIS — F329 Major depressive disorder, single episode, unspecified: Secondary | ICD-10-CM

## 2018-02-26 DIAGNOSIS — O321XX0 Maternal care for breech presentation, not applicable or unspecified: Secondary | ICD-10-CM | POA: Diagnosis not present

## 2018-02-26 DIAGNOSIS — F419 Anxiety disorder, unspecified: Secondary | ICD-10-CM

## 2018-02-26 DIAGNOSIS — F32A Depression, unspecified: Secondary | ICD-10-CM

## 2018-02-26 DIAGNOSIS — O0993 Supervision of high risk pregnancy, unspecified, third trimester: Secondary | ICD-10-CM

## 2018-02-26 DIAGNOSIS — Z3A35 35 weeks gestation of pregnancy: Secondary | ICD-10-CM | POA: Diagnosis not present

## 2018-02-26 DIAGNOSIS — O209 Hemorrhage in early pregnancy, unspecified: Secondary | ICD-10-CM

## 2018-02-26 DIAGNOSIS — O09522 Supervision of elderly multigravida, second trimester: Secondary | ICD-10-CM

## 2018-02-26 DIAGNOSIS — O09293 Supervision of pregnancy with other poor reproductive or obstetric history, third trimester: Secondary | ICD-10-CM

## 2018-02-26 DIAGNOSIS — O208 Other hemorrhage in early pregnancy: Secondary | ICD-10-CM

## 2018-02-26 DIAGNOSIS — Z3685 Encounter for antenatal screening for Streptococcus B: Secondary | ICD-10-CM

## 2018-02-26 DIAGNOSIS — Z8632 Personal history of gestational diabetes: Principal | ICD-10-CM

## 2018-02-26 LAB — POCT URINALYSIS DIPSTICK OB
Glucose, UA: NEGATIVE
POC,PROTEIN,UA: NEGATIVE

## 2018-02-26 MED ORDER — VALACYCLOVIR HCL 500 MG PO TABS: 500.0000 mg | ORAL_TABLET | Freq: Two times a day (BID) | ORAL | 6 refills | Status: DC

## 2018-02-28 ENCOUNTER — Encounter: Payer: Self-pay | Admitting: Obstetrics & Gynecology

## 2018-02-28 ENCOUNTER — Ambulatory Visit (INDEPENDENT_AMBULATORY_CARE_PROVIDER_SITE_OTHER): Payer: Medicaid Other | Admitting: Obstetrics & Gynecology

## 2018-02-28 VITALS — BP 120/80 | Wt 207.0 lb

## 2018-02-28 DIAGNOSIS — R109 Unspecified abdominal pain: Secondary | ICD-10-CM

## 2018-02-28 DIAGNOSIS — O26899 Other specified pregnancy related conditions, unspecified trimester: Secondary | ICD-10-CM

## 2018-02-28 LAB — STREP GP B NAA: Strep Gp B NAA: POSITIVE — AB

## 2018-02-28 MED ORDER — NIFEDIPINE ER OSMOTIC RELEASE 30 MG PO TB24: 30.0000 mg | ORAL_TABLET | Freq: Every day | ORAL | 2 refills | Status: DC

## 2018-02-28 NOTE — Progress Notes (Signed)
Routine Prenatal Care Visit  Subjective  Kristen Mcpherson is a 38 y.o. [id number] at [redacted]w[redacted]d being seen today for ongoing prenatal care.  She is currently monitored for the following issues for this high-risk pregnancy and has History of gestational diabetes in prior pregnancy, currently pregnant in third trimester; AMA (advanced maternal age) multigravida 35+, second trimester; High-risk pregnancy, third trimester; Anxiety and depression; Vaginal bleeding affecting early pregnancy; Cystic fibrosis carrier; HSIL on Pap smear of cervix; Abdominal pain affecting pregnancy, antepartum; Hypotension; and Iron deficiency anemia on their problem list.  ----------------------------------------------------------------------------------- Patient reports no complaints.   Contractions: Irregular. Vag. Bleeding: None.  Movement: Present. Denies leaking of fluid.  ----------------------------------------------------------------------------------- The following portions of the patient's history were reviewed and updated as appropriate: allergies, current medications, past family history, past medical history, past social history, past surgical history and problem list. Problem list updated.   Objective  Blood pressure 100/70, weight 206 lb (93.4 kg), last menstrual period 06/20/2017. Pregravid weight 168 lb (76.2 kg) Total Weight Gain 38 lb (17.2 kg) Urinalysis:      Fetal Status:     Movement: Present     General:  Alert, oriented and cooperative. Patient is in no acute distress.  Skin: Skin is warm and dry. No rash noted.   Cardiovascular: Normal heart rate noted  Respiratory: Normal respiratory effort, no problems with respiration noted  Abdomen: Soft, gravid, appropriate for gestational age. Pain/Pressure: Present     Pelvic:  Cervical exam performed        Extremities: Normal range of motion.     ental Status: Normal mood and affect. Normal behavior. Normal judgment and thought content.   US Ob  Limited  Result Date: 02/26/2018 Patient Name: Kristen Mcpherson DOB: April 02, 1980 MRN: [id number] ULTRASOUND REPORT Location: [facility] Date of Service: 02/26/2018 Indications: Fetal presentation Findings: Mason Jim intrauterine pregnancy is visualized with FHR at 149 BPM. Fetal presentation is Cephalic. Placenta: anterior. Grade: 2 AFI: 14.1 cm Impression: 1. [redacted]w[redacted]d Viable Singleton Intrauterine pregnancy dated by previously established criteria. 2. AFI is 14.1 cm. Recommendations: 1.Clinical correlation with the patient's History and Physical Exam. Darlina Guys, RDMS RVT There is a singleton gestation with normal amniotic fluid volume. .The visualized fetal anatomical survey appears within normal limits within the resolution of ultrasound as described above. Presentation is cephalic.   It must be noted that a normal ultrasound is unable to rule out fetal aneuploidy.  Vena Austria, MD, [facility], [facility] 02/26/2018, 12:16 PM   US Ob Follow Up  Result Date: 02/18/2018 Patient Name: Kristen Mcpherson DOB: 02/06/1980 MRN: [id number] ULTRASOUND REPORT Location: [facility] Date of Service: 02/18/2018 Indications:growth/afi Findings: Mason Jim intrauterine pregnancy is visualized with FHR at 151 BPM. Biometrics give an (U/S) Gestational age of [redacted]w[redacted]d and an (U/S) EDD of 03/15/18; this is 12 days ahead of clinically established Estimated Date of Delivery: 03/27/18. Fetal presentation is Breech-oblique with head maternal right. Placenta: anterior. Grade: 2 AFI: 16.0 cm Growth percentile is 80.6 (AC >97.7%tile & HC measuring 3 weeks ahead). EFW: 3,043g (6lbs 11oz) Impression: 1. [redacted]w[redacted]d Viable Singleton Intrauterine pregnancy previously established criteria. 2. Growth is 80.6 %ile.  AFI is 16.0 cm. Recommendations: 1.Clinical correlation with the patient's History and Physical Exam. Darlina Guys, RDMS RVT Review of ULTRASOUND.    I have personally reviewed images and report of recent  ultrasound done at [facility].    Plan of management to be discussed with patient. Annamarie Major, MD, [facility], [facility] 02/18/2018  2:25 PM     Assessment   37 y.o. [id number] at [redacted]w[redacted]d by  03/27/2018, by Last Menstrual Period presenting for routine prenatal visit  Plan   Pregnancy #5 Problems (from 06/20/17 to present)    Problem Noted Resolved   Cystic fibrosis carrier 10/07/2017 by Rosalva Ferron No   Overview Signed 10/07/2017 10:48 AM by Rosalva Ferron    10/07/17 GC for CF carrier status.  Couple undecided regarding paternal CF carrier screening.       Vaginal bleeding affecting early pregnancy 09/05/2017 by Conard Novak, MD No   History of gestational diabetes in prior pregnancy, currently pregnant in third trimester 08/29/2017 by Natale Milch, MD No   AMA (advanced maternal age) multigravida 35+, second trimester 08/29/2017 by Natale Milch, MD No   High-risk pregnancy, third trimester 08/29/2017 by Natale Milch, MD No   Overview Addendum 02/25/2018  8:38 PM by Farrel Conners, CNM    Clinic [facility] Prenatal Labs  Dating [place] Blood type: A/Positive/-- (07/19 0000) A +  Genetic Screen NIPS: Normal 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 Antibody:   Anatomic [place]  complete Rubella: Immune (07/19 0000)  Varicella: Immune  GTT Early: WNLThird trimester:  RPR: Nonreactive (07/19 0000)   Rhogam  not needed HBsAg: Negative (07/19 0000)   TDaP vaccine                        Flu Shot: Declines HIV: Non-reactive (07/19 0000)   Baby Food        bottle                        GBS:   Contraception  BTL-30 day papers signed 2/18 Pap: HSIL HPV +  CBB     CS/VBAC  HSV+ needs valtrex at 36 weeks [X]   Support Person  Prescribed 2/18            Gestational age appropriate obstetric precautions including but not limited to vaginal bleeding, contractions, leaking of fluid and fetal movement were reviewed in detail  with the patient.    - vertex presentation - GBS collected - has already started on valtrex  Return in about 1 week (around 03/05/2018) for ROB.  Vena Austria, MD, [facility], [facility]

## 2018-02-28 NOTE — Progress Notes (Signed)
   Gynecology Pelvic Pain Evaluation   Chief Complaint  Patient presents with  . Pelvic Pain    pressure    History of Present Illness:   Patient is a 38 y.o. [id number] who LMP was Patient's last menstrual period was 06/20/2017 (exact date)., presents today for a problem visit.  She complains of pain.  She has pain every pregnancy in the past month of pregnancy without progression into labor, but pain that is low pelvis, intermittent, severe at times, radiation to lower back, modified by rest, and no other context or modifers or assoc sx's.  Denies VB or ROM.  Last seen 2 days ago with a 3 cm cervix reported.  PMHx: She  has a past medical history of Anemia, Anxiety, Asthma, and Iron deficiency anemia (01/08/2018). Also,  has a past surgical history that includes Appendectomy., family history includes ADD / ADHD in her brother; Anemia in her mother; Anxiety disorder in her mother; Arthritis in her mother; Depression in her father and mother; Diabetes in her mother; Hyperlipidemia in her father.,  reports that she quit smoking about 13 months ago. She has never used smokeless tobacco. She reports previous alcohol use. She reports that she does not use drugs.  She has a current medication list which includes the following prescription(s): butalbital-acetaminophen-caffeine, vitamin d3, esomeprazole, ferrous sulfate, nifedipine, ondansetron, citranatal assure, thiamine, valacyclovir, valacyclovir, and vitamin b-12. Also, has No Known Allergies.  Review of Systems  All other systems reviewed and are negative.  Objective: BP 120/80   Wt 207 lb (93.9 kg)   LMP 06/20/2017 (Exact Date)   BMI 33.41 kg/m  Physical Exam Constitutional:      General: She is not in acute distress.    Appearance: She is well-developed.  Genitourinary:     Pelvic exam was performed with patient supine.     Vagina and uterus normal.     No vaginal erythema or bleeding.     No cervical motion tenderness, discharge, polyp  or nabothian cyst.     Uterus is mobile.     Uterus is not enlarged.     No uterine mass detected.    Uterus is midaxial.     No right or left adnexal mass present.     Right adnexa not tender.     Left adnexa not tender.     Genitourinary Comments: CERVIX 1 cm int os, 3 cm ext os, 50%, -3, VTX  HENT:     Head: Normocephalic and atraumatic.     Nose: Nose normal.  Abdominal:     General: There is no distension.     Palpations: Abdomen is soft.     Tenderness: There is no abdominal tenderness.  Musculoskeletal: Normal range of motion.  Neurological:     Mental Status: She is alert and oriented to person, place, and time.     Cranial Nerves: No cranial nerve deficit.  Skin:    General: Skin is warm and dry.   Female chaperone present for pelvic portion of the physical exam  Assessment: 38 y.o. [phone] with  lower quadrant abdominal pain affecting pregnancy  Heat rest monitoring for labor Procardia as attempt to slow ctx type pain and allow time to rest  Keep appt next Wed  Annamarie Major, MD, [facility], [facility] 02/28/2018  3:11 PM

## 2018-03-04 ENCOUNTER — Encounter: Payer: Self-pay | Admitting: Obstetrics and Gynecology

## 2018-03-04 DIAGNOSIS — B951 Streptococcus, group B, as the cause of diseases classified elsewhere: Secondary | ICD-10-CM | POA: Insufficient documentation

## 2018-03-05 ENCOUNTER — Ambulatory Visit (INDEPENDENT_AMBULATORY_CARE_PROVIDER_SITE_OTHER): Payer: Medicaid Other | Admitting: Obstetrics & Gynecology

## 2018-03-05 VITALS — BP 120/70 | Wt 209.0 lb

## 2018-03-05 DIAGNOSIS — Z3A36 36 weeks gestation of pregnancy: Secondary | ICD-10-CM

## 2018-03-05 DIAGNOSIS — O09522 Supervision of elderly multigravida, second trimester: Secondary | ICD-10-CM

## 2018-03-05 DIAGNOSIS — O09523 Supervision of elderly multigravida, third trimester: Secondary | ICD-10-CM

## 2018-03-05 DIAGNOSIS — O0993 Supervision of high risk pregnancy, unspecified, third trimester: Secondary | ICD-10-CM

## 2018-03-05 LAB — POCT URINALYSIS DIPSTICK OB
Glucose, UA: NEGATIVE
POC,PROTEIN,UA: NEGATIVE

## 2018-03-05 NOTE — Progress Notes (Signed)
  Subjective  Fetal Movement? yes Contractions? yes Leaking Fluid? no Vaginal Bleeding? no  Objective  BP 120/70   Wt 209 lb (94.8 kg)   LMP 06/20/2017 (Exact Date)   BMI 33.73 kg/m  General: NAD Pumonary: no increased work of breathing Abdomen: gravid, non-tender Extremities: no edema Psychiatric: mood appropriate, affect full  Assessment  38 y.o. [id number] at [redacted]w[redacted]d by  03/27/2018, by Last Menstrual Period presenting for routine prenatal visit  Plan   Problem List Items Addressed This Visit      Other   AMA (advanced maternal age) multigravida 35+, second trimester   High-risk pregnancy, third trimester    Other Visit Diagnoses    [redacted] weeks gestation of pregnancy    -  Primary    Procardia no help w ctxs Desires IOL, discussed 39 weeks as there are no other risk factors; she has been induced w all prior pregnancies, delivers quickly after AROM    Sch for 03/20/18 at 0500 Valtrex daily Plans PP BTL  Annamarie Major, MD, [facility], [facility] 03/05/2018  11:03 AM

## 2018-03-05 NOTE — Patient Instructions (Signed)
Labor Induction    Labor induction is when steps are taken to cause a pregnant woman to begin the labor process. Most women go into labor on their own between 37 weeks and 42 weeks of pregnancy. When this does not happen or when there is a medical need for labor to begin, steps may be taken to induce labor. Labor induction causes a pregnant woman's uterus to contract. It also causes the cervix to soften (ripen), open (dilate), and thin out (efface). Usually, labor is not induced before 39 weeks of pregnancy unless there is a medical reason to do so. Your health care provider will determine if labor induction is needed.  Before inducing labor, your health care provider will consider a number of factors, including:  · Your medical condition and your baby's.  · How many weeks along you are in your pregnancy.  · How mature your baby's lungs are.  · The condition of your cervix.  · The position of your baby.  · The size of your birth canal.  What are some reasons for labor induction?  Labor may be induced if:  · Your health or your baby's health is at risk.  · Your pregnancy is overdue by 1 week or more.  · Your water breaks but labor does not start on its own.  · There is a low amount of amniotic fluid around your baby.  You may also choose (elect) to have labor induced at a certain time. Generally, elective labor induction is done no earlier than 39 weeks of pregnancy.  What methods are used for labor induction?  Methods used for labor induction include:  · Prostaglandin medicine. This medicine starts contractions and causes the cervix to dilate and ripen. It can be taken by mouth (orally) or by being inserted into the vagina (suppository).  · Inserting a small, thin tube (catheter) with a balloon into the vagina and then expanding the balloon with water to dilate the cervix.  · Stripping the membranes. In this method, your health care provider gently separates amniotic sac tissue from the cervix. This causes the  cervix to stretch, which in turn causes the release of a hormone called progesterone. The hormone causes the uterus to contract. This procedure is often done during an office visit, after which you will be sent home to wait for contractions to begin.  · Breaking the water. In this method, your health care provider uses a small instrument to make a small hole in the amniotic sac. This eventually causes the amniotic sac to break. Contractions should begin after a few hours.  · Medicine to trigger or strengthen contractions. This medicine is given through an IV that is inserted into a vein in your arm.  Except for membrane stripping, which can be done in a clinic, labor induction is done in the hospital so that you and your baby can be carefully monitored.  How long does it take for labor to be induced?  The length of time it takes to induce labor depends on how ready your body is for labor. Some inductions can take up to 2-3 days, while others may take less than a day. Induction may take longer if:  · You are induced early in your pregnancy.  · It is your first pregnancy.  · Your cervix is not ready.  What are some risks associated with labor induction?  Some risks associated with labor induction include:  · Changes in fetal heart rate, such as being too   high, too low, or irregular (erratic).  · Failed induction.  · Infection in the mother or the baby.  · Increased risk of having a cesarean delivery.  · Fetal death.  · Breaking off (abruption) of the placenta from the uterus (rare).  · Rupture of the uterus (very rare).  When induction is needed for medical reasons, the benefits of induction generally outweigh the risks.  What are some reasons for not inducing labor?  Labor induction should not be done if:  · Your baby does not tolerate contractions.  · You have had previous surgeries on your uterus, such as a myomectomy, removal of fibroids, or a vertical scar from a previous cesarean delivery.  · Your placenta lies  very low in your uterus and blocks the opening of the cervix (placenta previa).  · Your baby is not in a head-down position.  · The umbilical cord drops down into the birth canal in front of the baby.  · There are unusual circumstances, such as the baby being very early (premature).  · You have had more than 2 previous cesarean deliveries.  Summary  · Labor induction is when steps are taken to cause a pregnant woman to begin the labor process.  · Labor induction causes a pregnant woman's uterus to contract. It also causes the cervix to ripen, dilate, and efface.  · Labor is not induced before 39 weeks of pregnancy unless there is a medical reason to do so.  · When induction is needed for medical reasons, the benefits of induction generally outweigh the risks.  This information is not intended to replace advice given to you by your health care provider. Make sure you discuss any questions you have with your health care provider.  Document Released: 05/09/2006 Document Revised: 02/01/2016 Document Reviewed: 02/01/2016  Elsevier Interactive Patient Education © 2019 [employer]

## 2018-03-05 NOTE — Addendum Note (Signed)
Addended by: Cornelius Moras D on: 03/05/2018 11:07 AM   Modules accepted: Orders

## 2018-03-10 ENCOUNTER — Encounter: Payer: Self-pay | Admitting: Obstetrics and Gynecology

## 2018-03-10 ENCOUNTER — Ambulatory Visit (INDEPENDENT_AMBULATORY_CARE_PROVIDER_SITE_OTHER): Payer: Medicaid Other | Admitting: Obstetrics and Gynecology

## 2018-03-10 VITALS — BP 110/60 | Wt 210.0 lb

## 2018-03-10 DIAGNOSIS — B951 Streptococcus, group B, as the cause of diseases classified elsewhere: Secondary | ICD-10-CM

## 2018-03-10 DIAGNOSIS — F419 Anxiety disorder, unspecified: Secondary | ICD-10-CM

## 2018-03-10 DIAGNOSIS — D508 Other iron deficiency anemias: Secondary | ICD-10-CM

## 2018-03-10 DIAGNOSIS — O99343 Other mental disorders complicating pregnancy, third trimester: Secondary | ICD-10-CM

## 2018-03-10 DIAGNOSIS — R109 Unspecified abdominal pain: Secondary | ICD-10-CM

## 2018-03-10 DIAGNOSIS — F32A Depression, unspecified: Secondary | ICD-10-CM

## 2018-03-10 DIAGNOSIS — O99820 Streptococcus B carrier state complicating pregnancy: Secondary | ICD-10-CM

## 2018-03-10 DIAGNOSIS — O0993 Supervision of high risk pregnancy, unspecified, third trimester: Secondary | ICD-10-CM

## 2018-03-10 DIAGNOSIS — F329 Major depressive disorder, single episode, unspecified: Secondary | ICD-10-CM

## 2018-03-10 DIAGNOSIS — O09523 Supervision of elderly multigravida, third trimester: Secondary | ICD-10-CM

## 2018-03-10 DIAGNOSIS — Z3A37 37 weeks gestation of pregnancy: Secondary | ICD-10-CM

## 2018-03-10 DIAGNOSIS — O26899 Other specified pregnancy related conditions, unspecified trimester: Secondary | ICD-10-CM

## 2018-03-10 DIAGNOSIS — O26893 Other specified pregnancy related conditions, third trimester: Secondary | ICD-10-CM

## 2018-03-10 DIAGNOSIS — O09522 Supervision of elderly multigravida, second trimester: Secondary | ICD-10-CM

## 2018-03-10 NOTE — Progress Notes (Signed)
ROB C/o pain/pressure, denies lof, no vb, random fluid that comes out, and contractions

## 2018-03-10 NOTE — Progress Notes (Signed)
Routine Prenatal Care Visit  Subjective  Kristen Mcpherson is a 38 y.o. [id number] at [redacted]w[redacted]d being seen today for ongoing prenatal care.  She is currently monitored for the following issues for this high-risk pregnancy and has History of gestational diabetes in prior pregnancy, currently pregnant in third trimester; AMA (advanced maternal age) multigravida 35+, second trimester; High-risk pregnancy, third trimester; Anxiety and depression; Vaginal bleeding affecting early pregnancy; Cystic fibrosis carrier; HSIL on Pap smear of cervix; Abdominal pain affecting pregnancy, antepartum; Hypotension; Iron deficiency anemia; and Positive GBS test on their problem list.  ----------------------------------------------------------------------------------- Patient reports continued pelvic pain and pressure.   Contractions: Irregular. Vag. Bleeding: None.  Movement: Present. Denies leaking of fluid.  ----------------------------------------------------------------------------------- The following portions of the patient's history were reviewed and updated as appropriate: allergies, current medications, past family history, past medical history, past social history, past surgical history and problem list. Problem list updated.   Objective  Blood pressure 110/60, weight 210 lb (95.3 kg), last menstrual period 06/20/2017. Pregravid weight 168 lb (76.2 kg) Total Weight Gain 42 lb (19.1 kg) Urinalysis:      Fetal Status: Fetal Heart Rate (bpm): 145   Movement: Present     General:  Alert, oriented and cooperative. Patient is in no acute distress.  Skin: Skin is warm and dry. No rash noted.   Cardiovascular: Normal heart rate noted  Respiratory: Normal respiratory effort, no problems with respiration noted  Abdomen: Soft, gravid, appropriate for gestational age. Pain/Pressure: Present     Pelvic:  Cervical exam performed Dilation: 1 Effacement (%): 50 Station: -3 membrane sweep performed at maternal request    Extremities: Normal range of motion.     Mental Status: Normal mood and affect. Normal behavior. Normal judgment and thought content.     Assessment   38 y.o. [id number] at [redacted]w[redacted]d by  03/27/2018, by Last Menstrual Period presenting for routine prenatal visit  Plan   Pregnancy #5 Problems (from 06/20/17 to present)    Problem Noted Resolved   Positive GBS test 03/04/2018 by Vena Austria, MD No   Cystic fibrosis carrier 10/07/2017 by Rosalva Ferron No   Overview Signed 10/07/2017 10:48 AM by Rosalva Ferron    10/07/17 GC for CF carrier status.  Couple undecided regarding paternal CF carrier screening.       Vaginal bleeding affecting early pregnancy 09/05/2017 by Conard Novak, MD No   History of gestational diabetes in prior pregnancy, currently pregnant in third trimester 08/29/2017 by Natale Milch, MD No   AMA (advanced maternal age) multigravida 35+, second trimester 08/29/2017 by Natale Milch, MD No   High-risk pregnancy, third trimester 08/29/2017 by Natale Milch, MD No   Overview Addendum 03/04/2018 10:34 PM by Vena Austria, MD    Clinic [facility] Prenatal Labs  Dating [place] Blood type: A/Positive/-- (07/19 0000) A +  Genetic Screen NIPS: Normal 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 Antibody:   Anatomic [place]  complete Rubella: Immune (07/19 0000)  Varicella: Immune  GTT Early: WNLThird trimester:  RPR: Nonreactive (07/19 0000)   Rhogam  not needed HBsAg: Negative (07/19 0000)   TDaP vaccine                        Flu Shot: Declines HIV: Non-reactive (07/19 0000)   Baby Food        bottle  OMB:TDHRCBUL  Contraception  BTL-30 day papers signed 2/18 Pap: HSIL HPV +  CBB     CS/VBAC N/A HSV+ needs valtrex at 36 weeks [X]   Support Person  Prescribed 2/18            Gestational age appropriate obstetric precautions including but not limited to vaginal bleeding, contractions, leaking of fluid and fetal  movement were reviewed in detail with the patient.    Return in about 1 week (around 03/17/2018) for ROB.  Natale Milch MD [facility], [facility] 03/10/2018, 10:31 AM

## 2018-03-12 ENCOUNTER — Other Ambulatory Visit: Payer: Self-pay

## 2018-03-12 ENCOUNTER — Encounter: Payer: Medicaid Other | Admitting: Maternal Newborn

## 2018-03-12 ENCOUNTER — Inpatient Hospital Stay: Payer: Medicaid Other | Attending: Oncology

## 2018-03-12 VITALS — BP 106/68 | HR 95 | Temp 96.8°F | Resp 18

## 2018-03-12 DIAGNOSIS — D519 Vitamin B12 deficiency anemia, unspecified: Secondary | ICD-10-CM | POA: Insufficient documentation

## 2018-03-12 DIAGNOSIS — D508 Other iron deficiency anemias: Secondary | ICD-10-CM | POA: Diagnosis not present

## 2018-03-12 MED ORDER — SODIUM CHLORIDE 0.9 % IV SOLN
Freq: Once | INTRAVENOUS | Status: AC
  Administered 2018-03-12: 14:00:00 via INTRAVENOUS
  Filled 2018-03-12: qty 250

## 2018-03-12 MED ORDER — CYANOCOBALAMIN 1000 MCG/ML IJ SOLN
1000.0000 ug | Freq: Once | INTRAMUSCULAR | Status: AC
  Administered 2018-03-12: 1000 ug via INTRAMUSCULAR
  Filled 2018-03-12: qty 1

## 2018-03-12 MED ORDER — IRON SUCROSE 20 MG/ML IV SOLN
200.0000 mg | Freq: Once | INTRAVENOUS | Status: AC
  Administered 2018-03-12: 200 mg via INTRAVENOUS
  Filled 2018-03-12: qty 10

## 2018-03-14 ENCOUNTER — Telehealth: Payer: Self-pay

## 2018-03-14 NOTE — Telephone Encounter (Signed)
She still would not have any help on the 21st. She said she'd just have to have the baby by herself on Thursday.  Her hsb will have to stay c the other seven children - a couple of which are special needs.  Adv maybe to call L&D and see if a D[place] will be available to help her.

## 2018-03-14 NOTE — Telephone Encounter (Signed)
Pt is panicking; she is scheduled for IOL 5am next Thursday.  As of Monday she will not have anyone to watch her kids while she is in hosp d/t [facility] schools shutting down [place] and Mother in law is working 12hrs shifts.  Is there anyway she can be induced before Monday?  Pleas call.  [phone]

## 2018-03-18 ENCOUNTER — Encounter: Payer: Medicaid Other | Admitting: Obstetrics & Gynecology

## 2018-03-18 ENCOUNTER — Telehealth: Payer: Self-pay

## 2018-03-18 NOTE — Telephone Encounter (Signed)
Pt has appt in am c PH.  She is feeling really weak and nauseas.  IOL in 2d.  When she stands she has to sit back down.  [phone]  Adv if she feels that bad to go to ED.

## 2018-03-19 ENCOUNTER — Encounter: Payer: Self-pay | Admitting: Obstetrics & Gynecology

## 2018-03-19 ENCOUNTER — Other Ambulatory Visit: Payer: Self-pay

## 2018-03-19 ENCOUNTER — Ambulatory Visit (INDEPENDENT_AMBULATORY_CARE_PROVIDER_SITE_OTHER): Payer: Medicaid Other | Admitting: Obstetrics & Gynecology

## 2018-03-19 VITALS — BP 100/60 | Wt 211.0 lb

## 2018-03-19 DIAGNOSIS — O99820 Streptococcus B carrier state complicating pregnancy: Secondary | ICD-10-CM

## 2018-03-19 DIAGNOSIS — Z3A38 38 weeks gestation of pregnancy: Secondary | ICD-10-CM

## 2018-03-19 DIAGNOSIS — O09523 Supervision of elderly multigravida, third trimester: Secondary | ICD-10-CM

## 2018-03-19 DIAGNOSIS — O0993 Supervision of high risk pregnancy, unspecified, third trimester: Secondary | ICD-10-CM

## 2018-03-19 DIAGNOSIS — O09522 Supervision of elderly multigravida, second trimester: Secondary | ICD-10-CM

## 2018-03-19 LAB — POCT URINALYSIS DIPSTICK OB
Glucose, UA: NEGATIVE
POC,PROTEIN,UA: NEGATIVE

## 2018-03-19 NOTE — Patient Instructions (Signed)
Labor Induction    Labor induction is when steps are taken to cause a pregnant woman to begin the labor process. Most women go into labor on their own between 37 weeks and 42 weeks of pregnancy. When this does not happen or when there is a medical need for labor to begin, steps may be taken to induce labor. Labor induction causes a pregnant woman's uterus to contract. It also causes the cervix to soften (ripen), open (dilate), and thin out (efface). Usually, labor is not induced before 39 weeks of pregnancy unless there is a medical reason to do so. Your health care provider will determine if labor induction is needed.  Before inducing labor, your health care provider will consider a number of factors, including:  · Your medical condition and your baby's.  · How many weeks along you are in your pregnancy.  · How mature your baby's lungs are.  · The condition of your cervix.  · The position of your baby.  · The size of your birth canal.  What are some reasons for labor induction?  Labor may be induced if:  · Your health or your baby's health is at risk.  · Your pregnancy is overdue by 1 week or more.  · Your water breaks but labor does not start on its own.  · There is a low amount of amniotic fluid around your baby.  You may also choose (elect) to have labor induced at a certain time. Generally, elective labor induction is done no earlier than 39 weeks of pregnancy.  What methods are used for labor induction?  Methods used for labor induction include:  · Prostaglandin medicine. This medicine starts contractions and causes the cervix to dilate and ripen. It can be taken by mouth (orally) or by being inserted into the vagina (suppository).  · Inserting a small, thin tube (catheter) with a balloon into the vagina and then expanding the balloon with water to dilate the cervix.  · Stripping the membranes. In this method, your health care provider gently separates amniotic sac tissue from the cervix. This causes the  cervix to stretch, which in turn causes the release of a hormone called progesterone. The hormone causes the uterus to contract. This procedure is often done during an office visit, after which you will be sent home to wait for contractions to begin.  · Breaking the water. In this method, your health care provider uses a small instrument to make a small hole in the amniotic sac. This eventually causes the amniotic sac to break. Contractions should begin after a few hours.  · Medicine to trigger or strengthen contractions. This medicine is given through an IV that is inserted into a vein in your arm.  Except for membrane stripping, which can be done in a clinic, labor induction is done in the hospital so that you and your baby can be carefully monitored.  How long does it take for labor to be induced?  The length of time it takes to induce labor depends on how ready your body is for labor. Some inductions can take up to 2-3 days, while others may take less than a day. Induction may take longer if:  · You are induced early in your pregnancy.  · It is your first pregnancy.  · Your cervix is not ready.  What are some risks associated with labor induction?  Some risks associated with labor induction include:  · Changes in fetal heart rate, such as being too   high, too low, or irregular (erratic).  · Failed induction.  · Infection in the mother or the baby.  · Increased risk of having a cesarean delivery.  · Fetal death.  · Breaking off (abruption) of the placenta from the uterus (rare).  · Rupture of the uterus (very rare).  When induction is needed for medical reasons, the benefits of induction generally outweigh the risks.  What are some reasons for not inducing labor?  Labor induction should not be done if:  · Your baby does not tolerate contractions.  · You have had previous surgeries on your uterus, such as a myomectomy, removal of fibroids, or a vertical scar from a previous cesarean delivery.  · Your placenta lies  very low in your uterus and blocks the opening of the cervix (placenta previa).  · Your baby is not in a head-down position.  · The umbilical cord drops down into the birth canal in front of the baby.  · There are unusual circumstances, such as the baby being very early (premature).  · You have had more than 2 previous cesarean deliveries.  Summary  · Labor induction is when steps are taken to cause a pregnant woman to begin the labor process.  · Labor induction causes a pregnant woman's uterus to contract. It also causes the cervix to ripen, dilate, and efface.  · Labor is not induced before 39 weeks of pregnancy unless there is a medical reason to do so.  · When induction is needed for medical reasons, the benefits of induction generally outweigh the risks.  This information is not intended to replace advice given to you by your health care provider. Make sure you discuss any questions you have with your health care provider.  Document Released: 05/09/2006 Document Revised: 02/01/2016 Document Reviewed: 02/01/2016  Elsevier Interactive Patient Education © 2019 [employer]

## 2018-03-19 NOTE — Progress Notes (Signed)
Pt seen today, doing well, prepared for IOL in am Good FM, rare ctx pains SVE 2/75/-3 and Vtx today Pros and cons of IOL discussed    ABX and Cytotec followed by AR[facility]  Annamarie Major, MD, [facility], [facility] 03/19/2018  8:38 AM

## 2018-03-19 NOTE — Progress Notes (Signed)
History and Physical  Kristen Mcpherson is a 38 y.o. [id number] [redacted]w[redacted]d  for Induction of Labor scheduled due to Favorable cervix at term .   See labor record for pregnancy highlights.  No recent pain, bleeding, ruptured membranes, or other signs of progressing labor.  Prior vag deliveries with need of AROM for most of them to get her into then fast and active labor.  GBS Pos this pregnancy.  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  PMHx: She  has a past medical history of Anemia, Anxiety, Asthma, and Iron deficiency anemia (01/08/2018). Also,  has a past surgical history that includes Appendectomy., family history includes ADD / ADHD in her brother; Anemia in her mother; Anxiety disorder in her mother; Arthritis in her mother; Depression in her father and mother; Diabetes in her mother; Hyperlipidemia in her father.,  reports that she quit smoking about 14 months ago. She has never used smokeless tobacco. She reports previous alcohol use. She reports that she does not use drugs. She has a current medication list which includes the following prescription(s): butalbital-acetaminophen-caffeine, vitamin d3, esomeprazole, ferrous sulfate, nifedipine, ondansetron, citranatal assure, thiamine, valacyclovir, valacyclovir, and vitamin b-12. Also, has No Known Allergies. OB History  Gravida Para Term Preterm AB Living  SAB TAB Ectopic Multiple Live Births        1 5    # Outcome Date GA Lbr Len/2nd Weight Sex Delivery Anes PTL Lv  5 Current           4A Preterm 10/23/11 [redacted]w[redacted]d  5 lb 4 oz (2.381 kg) F Vag-Spont   LIV  4B Preterm 10/23/11 [redacted]w[redacted]d  6 lb 9 oz (2.977 kg) M Vag-Spont   LIV     Complications: Heart abnormality  3 Term 10/09/05 [redacted]w[redacted]d  6 lb 7 oz (2.92 kg) M Vag-Spont   LIV  2 Term 09/20/03 [redacted]w[redacted]d  6 lb 9 oz (2.977 kg) M Vag-Spont   LIV  1 Term 07/04/99 [redacted]w[redacted]d  7 lb 14 oz (3.572 kg) M Vag-Spont   LIV  Patient denies any other pertinent gynecologic issues.   Review of Systems   Constitutional: Positive for malaise/fatigue. Negative for chills and fever.  HENT: Negative for congestion, sinus pain and sore throat.   Eyes: Negative for blurred vision and pain.  Respiratory: Negative for cough and wheezing.   Cardiovascular: Negative for chest pain and leg swelling.  Gastrointestinal: Negative for abdominal pain, constipation, diarrhea, heartburn, nausea and vomiting.  Genitourinary: Negative for dysuria, frequency, hematuria and urgency.  Musculoskeletal: Negative for back pain, joint pain, myalgias and neck pain.  Skin: Negative for itching and rash.  Neurological: Negative for dizziness, tremors and weakness.  Endo/Heme/Allergies: Does not bruise/bleed easily.  Psychiatric/Behavioral: Negative for depression. The patient is not nervous/anxious and does not have insomnia.     Objective: BP 100/60   Wt 211 lb (95.7 kg)   LMP 06/20/2017 (Exact Date)   BMI 34.06 kg/m  Physical Exam Constitutional:      General: She is not in acute distress.    Appearance: She is well-developed.  Genitourinary:     Pelvic exam was performed with patient supine.     Vagina, uterus and rectum normal.     No lesions in the vagina.     No vaginal bleeding.     No cervical motion tenderness, friability, lesion or polyp.     Uterus is mobile.     Uterus is not  enlarged.     No uterine mass detected.    Uterus is midaxial.     No right or left adnexal mass present.     Right adnexa not tender.     Left adnexa not tender.     Genitourinary Comments: Cx 3/75/-3, VTX  HENT:     Head: Normocephalic and atraumatic. No laceration.     Right Ear: Hearing normal.     Left Ear: Hearing normal.     Mouth/Throat:     Pharynx: Uvula midline.  Eyes:     Pupils: Pupils are equal, round, and reactive to light.  Neck:     Musculoskeletal: Normal range of motion and neck supple.     Thyroid: No thyromegaly.  Cardiovascular:     Rate and Rhythm: Normal rate and regular rhythm.      Heart sounds: No murmur. No friction rub. No gallop.   Pulmonary:     Effort: Pulmonary effort is normal. No respiratory distress.     Breath sounds: Normal breath sounds. No wheezing.  Chest:     Breasts:        Right: No mass, skin change or tenderness.        Left: No mass, skin change or tenderness.  Abdominal:     General: Bowel sounds are normal. There is no distension.     Palpations: Abdomen is soft.     Tenderness: There is no abdominal tenderness. There is no rebound.  Musculoskeletal: Normal range of motion.  Neurological:     Mental Status: She is alert and oriented to person, place, and time.     Cranial Nerves: No cranial nerve deficit.  Skin:    General: Skin is warm and dry.  Psychiatric:        Judgment: Judgment normal.  Vitals signs reviewed.    Assessment: Term Pregnancy for Induction of Labor due to Favorable cervix at term.  Plan: Patient will undergo induction of labor with cervical ripening agents, amniotomy.     Patient has been fully informed of the pros and cons, risks and benefits of continued observation with fetal monitoring versus that of induction of labor.   She understands that there are uncommon risks to induction, which include but are not limited to : frequent or prolonged uterine contractions, fetal distress, uterine rupture, and lack of successful induction.  These risks include all methods including Pitocin and Misoprostol and Cervadil.  Patient understands that using Misoprostol for labor induction is an "off label" indication although it has been studied extensively for this purpose and is an accepted method of induction.  She also has been informed of the increased risks for Cesarean with induction and should induction not be successful.  Patient consents to the induction plan of management.  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 Antibody: neg  Anatomic [place]  complete Rubella: Immune (07/19 0000)  Varicella: Immune  GTT Early: WNLThird trimester:  RPR: Nonreactive (07/19 0000)   Rhogam  not needed HBsAg: Negative (07/19 0000)   TDaP vaccine  DeclinesFlu Shot: Declines HIV: Non-reactive (07/19 0000)   Baby Food        bottle  [id number]  Contraception  BTL-30 day papers signed 2/18 Pap: HSIL HPV +  CBB  no   CS/VBAC N/A HSV+ needs valtrex at 36 weeks [X]   Support Person Husband Prescribed 2/18    Annamarie Major, MD, [facility], [facility] 03/19/2018  8:30 AM

## 2018-03-20 ENCOUNTER — Inpatient Hospital Stay
Admission: RE | Admit: 2018-03-20 | Discharge: 2018-03-22 | DRG: 784 | Disposition: A | Discharge status: Home / Self Care | Payer: Medicaid Other | Attending: Obstetrics & Gynecology | Admitting: Obstetrics & Gynecology

## 2018-03-20 ENCOUNTER — Other Ambulatory Visit: Payer: Self-pay

## 2018-03-20 ENCOUNTER — Encounter
Admission: RE | Disposition: A | Discharge status: Home / Self Care | Payer: Self-pay | Source: Home / Self Care | Attending: Obstetrics & Gynecology

## 2018-03-20 ENCOUNTER — Inpatient Hospital Stay: Payer: Medicaid Other | Admitting: Certified Registered Nurse Anesthetist

## 2018-03-20 DIAGNOSIS — Z302 Encounter for sterilization: Secondary | ICD-10-CM

## 2018-03-20 DIAGNOSIS — O321XX0 Maternal care for breech presentation, not applicable or unspecified: Secondary | ICD-10-CM | POA: Diagnosis present

## 2018-03-20 DIAGNOSIS — O09293 Supervision of pregnancy with other poor reproductive or obstetric history, third trimester: Secondary | ICD-10-CM

## 2018-03-20 DIAGNOSIS — O0993 Supervision of high risk pregnancy, unspecified, third trimester: Secondary | ICD-10-CM

## 2018-03-20 DIAGNOSIS — O9081 Anemia of the puerperium: Secondary | ICD-10-CM | POA: Diagnosis not present

## 2018-03-20 DIAGNOSIS — Z87891 Personal history of nicotine dependence: Secondary | ICD-10-CM | POA: Diagnosis not present

## 2018-03-20 DIAGNOSIS — D62 Acute posthemorrhagic anemia: Secondary | ICD-10-CM | POA: Diagnosis not present

## 2018-03-20 DIAGNOSIS — Z8632 Personal history of gestational diabetes: Secondary | ICD-10-CM

## 2018-03-20 DIAGNOSIS — O208 Other hemorrhage in early pregnancy: Secondary | ICD-10-CM

## 2018-03-20 DIAGNOSIS — Z3A39 39 weeks gestation of pregnancy: Secondary | ICD-10-CM

## 2018-03-20 DIAGNOSIS — O209 Hemorrhage in early pregnancy, unspecified: Secondary | ICD-10-CM

## 2018-03-20 DIAGNOSIS — Z141 Cystic fibrosis carrier: Secondary | ICD-10-CM

## 2018-03-20 DIAGNOSIS — B951 Streptococcus, group B, as the cause of diseases classified elsewhere: Secondary | ICD-10-CM

## 2018-03-20 DIAGNOSIS — O99824 Streptococcus B carrier state complicating childbirth: Secondary | ICD-10-CM

## 2018-03-20 DIAGNOSIS — O09522 Supervision of elderly multigravida, second trimester: Secondary | ICD-10-CM

## 2018-03-20 DIAGNOSIS — O26893 Other specified pregnancy related conditions, third trimester: Secondary | ICD-10-CM | POA: Diagnosis present

## 2018-03-20 HISTORY — DX: Maternal care for breech presentation, not applicable or unspecified: O32.1XX0

## 2018-03-20 HISTORY — PX: TUBAL LIGATION: SHX77

## 2018-03-20 LAB — TYPE AND SCREEN
ABO/RH(D): A POS
Antibody Screen: NEGATIVE

## 2018-03-20 LAB — CBC
HCT: 35.3 % — ABNORMAL LOW (ref 36.0–46.0)
Hemoglobin: 11.7 g/dL — ABNORMAL LOW (ref 12.0–15.0)
MCH: 31.8 pg (ref 26.0–34.0)
MCHC: 33.1 g/dL (ref 30.0–36.0)
MCV: 95.9 fL (ref 80.0–100.0)
Platelets: 234 10*3/uL (ref 150–400)
RBC: 3.68 MIL/uL — ABNORMAL LOW (ref 3.87–5.11)
RDW: 14.7 % (ref 11.5–15.5)
WBC: 6.9 10*3/uL (ref 4.0–10.5)
nRBC: 0 % (ref 0.0–0.2)

## 2018-03-20 SURGERY — Surgical Case
Anesthesia: Spinal

## 2018-03-20 MED ORDER — ONDANSETRON HCL 4 MG/2ML IJ SOLN
INTRAMUSCULAR | Status: DC | PRN
  Administered 2018-03-20: 4 mg via INTRAVENOUS

## 2018-03-20 MED ORDER — LIDOCAINE HCL (PF) 1 % IJ SOLN
INTRAMUSCULAR | Status: AC
  Filled 2018-03-20: qty 30

## 2018-03-20 MED ORDER — SOD CITRATE-CITRIC ACID 500-334 MG/5ML PO SOLN
ORAL | Status: AC
  Administered 2018-03-20: 30 mL
  Filled 2018-03-20: qty 30

## 2018-03-20 MED ORDER — KETOROLAC TROMETHAMINE 30 MG/ML IJ SOLN
30.0000 mg | Freq: Four times a day (QID) | INTRAMUSCULAR | Status: AC
  Administered 2018-03-20 – 2018-03-21 (×4): 30 mg via INTRAVENOUS
  Filled 2018-03-20 (×4): qty 1

## 2018-03-20 MED ORDER — BUPIVACAINE 0.25 % ON-Q PUMP DUAL CATH 400 ML
400.0000 mL | INJECTION | Status: DC
  Filled 2018-03-20: qty 400

## 2018-03-20 MED ORDER — LACTATED RINGERS IV SOLN
INTRAVENOUS | Status: DC
  Administered 2018-03-21 (×2): via INTRAVENOUS

## 2018-03-20 MED ORDER — ONDANSETRON HCL 4 MG/2ML IJ SOLN: 4.0000 mg | Freq: Four times a day (QID) | INTRAMUSCULAR | Status: DC | PRN

## 2018-03-20 MED ORDER — BUPIVACAINE ON-Q PAIN PUMP (FOR ORDER SET NO CHG)
INJECTION | Status: DC
  Filled 2018-03-20: qty 1

## 2018-03-20 MED ORDER — NALBUPHINE HCL 10 MG/ML IJ SOLN: 5.0000 mg | Freq: Once | INTRAMUSCULAR | Status: DC | PRN

## 2018-03-20 MED ORDER — WITCH HAZEL-GLYCERIN EX PADS: 1.0000 "application " | MEDICATED_PAD | CUTANEOUS | Status: DC | PRN

## 2018-03-20 MED ORDER — DIPHENHYDRAMINE HCL 50 MG/ML IJ SOLN: 12.5000 mg | INTRAMUSCULAR | Status: DC | PRN

## 2018-03-20 MED ORDER — OXYTOCIN 10 UNIT/ML IJ SOLN
INTRAMUSCULAR | Status: AC
  Filled 2018-03-20: qty 2

## 2018-03-20 MED ORDER — ACETAMINOPHEN 325 MG PO TABS: 650.0000 mg | ORAL_TABLET | ORAL | Status: DC | PRN

## 2018-03-20 MED ORDER — MEPERIDINE HCL 25 MG/ML IJ SOLN: 6.2500 mg | INTRAMUSCULAR | Status: DC | PRN

## 2018-03-20 MED ORDER — LACTATED RINGERS IV SOLN
INTRAVENOUS | Status: DC
  Administered 2018-03-20: 06:00:00 via INTRAVENOUS

## 2018-03-20 MED ORDER — KETOROLAC TROMETHAMINE 30 MG/ML IJ SOLN: 30.0000 mg | Freq: Four times a day (QID) | INTRAMUSCULAR | Status: AC

## 2018-03-20 MED ORDER — FENTANYL CITRATE (PF) 100 MCG/2ML IJ SOLN
INTRAMUSCULAR | Status: DC | PRN
  Administered 2018-03-20: 15 ug via INTRATHECAL

## 2018-03-20 MED ORDER — ZOLPIDEM TARTRATE 5 MG PO TABS: 5.0000 mg | ORAL_TABLET | Freq: Every evening | ORAL | Status: DC | PRN

## 2018-03-20 MED ORDER — SIMETHICONE 80 MG PO CHEW
80.0000 mg | CHEWABLE_TABLET | Freq: Three times a day (TID) | ORAL | Status: DC
  Administered 2018-03-20 – 2018-03-22 (×6): 80 mg via ORAL
  Filled 2018-03-20 (×6): qty 1

## 2018-03-20 MED ORDER — LIDOCAINE HCL (PF) 1 % IJ SOLN: 30.0000 mL | INTRAMUSCULAR | Status: DC | PRN

## 2018-03-20 MED ORDER — SODIUM CHLORIDE 0.9 % IV SOLN
2.0000 g | INTRAVENOUS | Status: AC
  Administered 2018-03-20: 2 g via INTRAVENOUS
  Filled 2018-03-20: qty 2

## 2018-03-20 MED ORDER — MISOPROSTOL 200 MCG PO TABS
ORAL_TABLET | ORAL | Status: AC
  Filled 2018-03-20: qty 4

## 2018-03-20 MED ORDER — OXYTOCIN 40 UNITS IN NORMAL SALINE INFUSION - SIMPLE MED
2.5000 [IU]/h | INTRAVENOUS | Status: AC
  Filled 2018-03-20: qty 1000

## 2018-03-20 MED ORDER — OXYTOCIN 40 UNITS IN NORMAL SALINE INFUSION - SIMPLE MED
1.0000 m[IU]/min | INTRAVENOUS | Status: DC
  Administered 2018-03-20: 2 m[IU]/min via INTRAVENOUS
  Filled 2018-03-20: qty 1000

## 2018-03-20 MED ORDER — NALBUPHINE HCL 10 MG/ML IJ SOLN
5.0000 mg | INTRAMUSCULAR | Status: DC | PRN
  Administered 2018-03-20: 5 mg via INTRAVENOUS
  Filled 2018-03-20: qty 1

## 2018-03-20 MED ORDER — OXYCODONE-ACETAMINOPHEN 5-325 MG PO TABS: 1.0000 | ORAL_TABLET | ORAL | Status: DC | PRN

## 2018-03-20 MED ORDER — ACETAMINOPHEN 325 MG PO TABS
650.0000 mg | ORAL_TABLET | Freq: Four times a day (QID) | ORAL | Status: AC
  Administered 2018-03-20 – 2018-03-21 (×4): 650 mg via ORAL
  Filled 2018-03-20 (×4): qty 2

## 2018-03-20 MED ORDER — BUPIVACAINE IN DEXTROSE 0.75-8.25 % IT SOLN
INTRATHECAL | Status: DC | PRN
  Administered 2018-03-20: 1.6 mL via INTRATHECAL

## 2018-03-20 MED ORDER — COCONUT OIL OIL: 1.0000 "application " | TOPICAL_OIL | Status: DC | PRN

## 2018-03-20 MED ORDER — PHENYLEPHRINE HCL 10 MG/ML IJ SOLN
INTRAMUSCULAR | Status: DC | PRN
  Administered 2018-03-20: 50 ug via INTRAVENOUS
  Administered 2018-03-20: 100 ug via INTRAVENOUS

## 2018-03-20 MED ORDER — MORPHINE SULFATE (PF) 2 MG/ML IV SOLN: 1.0000 mg | INTRAVENOUS | Status: DC | PRN

## 2018-03-20 MED ORDER — NALOXONE HCL 0.4 MG/ML IJ SOLN: 0.4000 mg | INTRAMUSCULAR | Status: DC | PRN

## 2018-03-20 MED ORDER — ONDANSETRON HCL 4 MG/2ML IJ SOLN: 4.0000 mg | Freq: Three times a day (TID) | INTRAMUSCULAR | Status: DC | PRN

## 2018-03-20 MED ORDER — DIPHENHYDRAMINE HCL 25 MG PO CAPS
25.0000 mg | ORAL_CAPSULE | ORAL | Status: DC | PRN
  Administered 2018-03-21: 25 mg via ORAL
  Filled 2018-03-20: qty 1

## 2018-03-20 MED ORDER — DEXAMETHASONE SODIUM PHOSPHATE 4 MG/ML IJ SOLN
INTRAMUSCULAR | Status: AC
  Filled 2018-03-20: qty 1

## 2018-03-20 MED ORDER — BUPIVACAINE HCL 0.5 % IJ SOLN
10.0000 mL | Freq: Once | INTRAMUSCULAR | Status: DC
  Filled 2018-03-20: qty 10

## 2018-03-20 MED ORDER — TERBUTALINE SULFATE 1 MG/ML IJ SOLN: 0.2500 mg | Freq: Once | INTRAMUSCULAR | Status: DC | PRN

## 2018-03-20 MED ORDER — BUTORPHANOL TARTRATE 2 MG/ML IJ SOLN: 1.0000 mg | INTRAMUSCULAR | Status: DC | PRN

## 2018-03-20 MED ORDER — PRENATAL MULTIVITAMIN CH
1.0000 | ORAL_TABLET | Freq: Every day | ORAL | Status: DC
  Administered 2018-03-21 – 2018-03-22 (×2): 1 via ORAL
  Filled 2018-03-20 (×2): qty 1

## 2018-03-20 MED ORDER — FENTANYL CITRATE (PF) 100 MCG/2ML IJ SOLN
INTRAMUSCULAR | Status: AC
  Filled 2018-03-20: qty 2

## 2018-03-20 MED ORDER — KETOROLAC TROMETHAMINE 30 MG/ML IJ SOLN: 30.0000 mg | Freq: Once | INTRAMUSCULAR | Status: AC

## 2018-03-20 MED ORDER — LIDOCAINE HCL (PF) 1 % IJ SOLN
INTRAMUSCULAR | Status: DC | PRN
  Administered 2018-03-20 (×2): 3 mL via SUBCUTANEOUS

## 2018-03-20 MED ORDER — SIMETHICONE 80 MG PO CHEW
80.0000 mg | CHEWABLE_TABLET | ORAL | Status: DC | PRN
  Administered 2018-03-22: 80 mg via ORAL
  Filled 2018-03-20: qty 1

## 2018-03-20 MED ORDER — OXYCODONE HCL 5 MG PO TABS
5.0000 mg | ORAL_TABLET | ORAL | Status: DC | PRN
  Administered 2018-03-21 – 2018-03-22 (×3): 5 mg via ORAL
  Filled 2018-03-20 (×2): qty 1

## 2018-03-20 MED ORDER — OXYTOCIN BOLUS FROM INFUSION: 500.0000 mL | Freq: Once | INTRAVENOUS | Status: DC

## 2018-03-20 MED ORDER — MISOPROSTOL 25 MCG QUARTER TABLET
25.0000 ug | ORAL_TABLET | ORAL | Status: DC | PRN
  Administered 2018-03-20: 25 ug via VAGINAL
  Filled 2018-03-20: qty 1

## 2018-03-20 MED ORDER — OXYTOCIN 40 UNITS IN NORMAL SALINE INFUSION - SIMPLE MED
2.5000 [IU]/h | INTRAVENOUS | Status: DC
  Administered 2018-03-20: 500 mL via INTRAVENOUS
  Filled 2018-03-20: qty 1000

## 2018-03-20 MED ORDER — LACTATED RINGERS IV SOLN: 500.0000 mL | INTRAVENOUS | Status: DC | PRN

## 2018-03-20 MED ORDER — SODIUM CHLORIDE 0.9 % IV SOLN
5.0000 10*6.[IU] | Freq: Once | INTRAVENOUS | Status: AC
  Administered 2018-03-20: 5 10*6.[IU] via INTRAVENOUS
  Filled 2018-03-20: qty 5

## 2018-03-20 MED ORDER — OXYCODONE HCL 5 MG PO TABS
10.0000 mg | ORAL_TABLET | ORAL | Status: DC | PRN
  Administered 2018-03-21 – 2018-03-22 (×4): 10 mg via ORAL
  Filled 2018-03-20 (×5): qty 2

## 2018-03-20 MED ORDER — AMMONIA AROMATIC IN INHA
RESPIRATORY_TRACT | Status: AC
  Filled 2018-03-20: qty 10

## 2018-03-20 MED ORDER — DIPHENHYDRAMINE HCL 25 MG PO CAPS: 25.0000 mg | ORAL_CAPSULE | Freq: Four times a day (QID) | ORAL | Status: DC | PRN

## 2018-03-20 MED ORDER — BUPIVACAINE HCL (PF) 0.5 % IJ SOLN
INTRAMUSCULAR | Status: DC | PRN
  Administered 2018-03-20: 4 mL

## 2018-03-20 MED ORDER — MORPHINE SULFATE (PF) 0.5 MG/ML IJ SOLN
INTRAMUSCULAR | Status: AC
  Filled 2018-03-20: qty 10

## 2018-03-20 MED ORDER — MENTHOL 3 MG MT LOZG
1.0000 | LOZENGE | OROMUCOSAL | Status: DC | PRN
  Filled 2018-03-20: qty 9

## 2018-03-20 MED ORDER — MORPHINE SULFATE (PF) 0.5 MG/ML IJ SOLN: INTRAMUSCULAR | Status: DC | PRN

## 2018-03-20 MED ORDER — DIBUCAINE 1 % RE OINT: 1.0000 "application " | TOPICAL_OINTMENT | RECTAL | Status: DC | PRN

## 2018-03-20 MED ORDER — MORPHINE SULFATE (PF) 0.5 MG/ML IJ SOLN
INTRAMUSCULAR | Status: DC | PRN
  Administered 2018-03-20: .1 mg via EPIDURAL

## 2018-03-20 MED ORDER — SIMETHICONE 80 MG PO CHEW
80.0000 mg | CHEWABLE_TABLET | ORAL | Status: DC
  Filled 2018-03-20: qty 1

## 2018-03-20 MED ORDER — SOD CITRATE-CITRIC ACID 500-334 MG/5ML PO SOLN: 30.0000 mL | ORAL | Status: DC

## 2018-03-20 MED ORDER — NALBUPHINE HCL 10 MG/ML IJ SOLN: 5.0000 mg | INTRAMUSCULAR | Status: DC | PRN

## 2018-03-20 MED ORDER — SENNOSIDES-DOCUSATE SODIUM 8.6-50 MG PO TABS
2.0000 | ORAL_TABLET | ORAL | Status: DC
  Administered 2018-03-21 – 2018-03-22 (×2): 2 via ORAL
  Filled 2018-03-20 (×2): qty 2

## 2018-03-20 MED ORDER — ONDANSETRON HCL 4 MG/2ML IJ SOLN
INTRAMUSCULAR | Status: AC
  Filled 2018-03-20: qty 2

## 2018-03-20 MED ORDER — PENICILLIN G 3 MILLION UNITS IVPB - SIMPLE MED
3.0000 10*6.[IU] | INTRAVENOUS | Status: DC
  Administered 2018-03-20: 3 10*6.[IU] via INTRAVENOUS
  Filled 2018-03-20 (×3): qty 100
  Filled 2018-03-20: qty 3
  Filled 2018-03-20: qty 100

## 2018-03-20 MED ORDER — DEXAMETHASONE SODIUM PHOSPHATE 10 MG/ML IJ SOLN
INTRAMUSCULAR | Status: DC | PRN
  Administered 2018-03-20: 10 mg via INTRAVENOUS

## 2018-03-20 MED ORDER — SODIUM CHLORIDE 0.9 % IV SOLN
INTRAVENOUS | Status: DC | PRN
  Administered 2018-03-20: 50 ug/min via INTRAVENOUS

## 2018-03-20 MED ORDER — SODIUM CHLORIDE 0.9% FLUSH: 3.0000 mL | INTRAVENOUS | Status: DC | PRN

## 2018-03-20 SURGICAL SUPPLY — 26 items
CANISTER SUCT 3000ML PPV (MISCELLANEOUS) ×3 IMPLANT
CATH KIT ON-Q SILVERSOAK 5IN (CATHETERS) ×6 IMPLANT
CHLORAPREP W/TINT 26 (MISCELLANEOUS) ×6 IMPLANT
COVER WAND RF STERILE (DRAPES) IMPLANT
DERMABOND ADVANCED (GAUZE/BANDAGES/DRESSINGS) ×1
DERMABOND ADVANCED .7 DNX12 (GAUZE/BANDAGES/DRESSINGS) ×2 IMPLANT
DRSG OPSITE POSTOP 4X10 (GAUZE/BANDAGES/DRESSINGS) ×3 IMPLANT
ELECT CAUTERY BLADE 6.4 (BLADE) ×3 IMPLANT
ELECT REM PT RETURN 9FT ADLT (ELECTROSURGICAL) ×3
ELECTRODE REM PT RTRN 9FT ADLT (ELECTROSURGICAL) ×2 IMPLANT
GLOVE SKINSENSE NS SZ8.0 LF (GLOVE) ×2
GLOVE SKINSENSE STRL SZ8.0 LF (GLOVE) ×4 IMPLANT
GOWN STRL REUS W/ TWL LRG LVL3 (GOWN DISPOSABLE) ×2 IMPLANT
GOWN STRL REUS W/ TWL XL LVL3 (GOWN DISPOSABLE) ×6 IMPLANT
GOWN STRL REUS W/TWL LRG LVL3 (GOWN DISPOSABLE) ×1
GOWN STRL REUS W/TWL XL LVL3 (GOWN DISPOSABLE) ×3
NS IRRIG 1000ML POUR BTL (IV SOLUTION) ×3 IMPLANT
PACK C SECTION AR (MISCELLANEOUS) ×3 IMPLANT
PAD OB MATERNITY 4.3X12.25 (PERSONAL CARE ITEMS) ×3 IMPLANT
PAD PREP 24X41 OB/GYN DISP (PERSONAL CARE ITEMS) ×3 IMPLANT
SUT MAXON ABS #0 GS21 30IN (SUTURE) ×6 IMPLANT
SUT VIC AB 1 CT1 36 (SUTURE) ×9 IMPLANT
SUT VIC AB 2-0 CT1 27 (SUTURE) ×3
SUT VIC AB 2-0 CT1 36 (SUTURE) ×3 IMPLANT
SUT VIC AB 2-0 CT1 TAPERPNT 27 (SUTURE) ×6 IMPLANT
SUT VIC AB 4-0 FS2 27 (SUTURE) ×3 IMPLANT

## 2018-03-20 NOTE — Discharge Summary (Signed)
OB Discharge Summary     Patient Name: Kristen Mcpherson DOB: Jan 01, 1981 MRN: [id number]  Date of admission: 03/20/2018 Delivering MD: Letitia Libra, MD  Date of Delivery: 03/20/2018  Date of discharge: 03/22/2018  Admitting diagnosis: Breech, 39 weeks delivery Intrauterine pregnancy: [redacted]w[redacted]d     Secondary diagnosis: None     Discharge diagnosis: Term Pregnancy Delivered, Reasons for cesarean section  Malpresentation breech                         Hospital course:  Induction of Labor With Cesarean Section  38 y.o. yo [id number] at [redacted]w[redacted]d was admitted to the hospital 03/20/2018 for induction of labor. Patient had a labor course significant for vertex on presentation but later changed to complete breech, based on exam followed by ultrasound findings. The patient went for cesarean section due to Malpresentation, and delivered a Viable infant,03/20/2018  Membrane Rupture Time/Date: 1:20 PM ,03/20/2018   Details of operation can be found in separate operative Note. Also had BTL done.  Patient had an uncomplicated postpartum course. She is ambulating, tolerating a regular diet, passing flatus, and urinating well.  Patient is discharged home in stable condition on 03/22/18.                                                                                                 Post partum procedures:none  Complications: None  Physical exam on 03/22/2018: Vitals:   03/21/18 1551 03/22/18 0036 03/22/18 0211 03/22/18 0740  BP: 97/61 (!) 91/46 (!) 106/57 99/67  Pulse: 82 80 79 88  Resp: 18 18  20   Temp: 98.2 F (36.8 C) (!) 97.5 F (36.4 C)  98.2 F (36.8 C)  TempSrc: Oral Oral  Oral  SpO2:  96%  99%  Weight:      Height:       General: alert, cooperative and no distress Lochia: appropriate Uterine Fundus: firm Incision: Healing well with no significant drainage, No significant erythema DVT Evaluation: No evidence of DVT seen on physical exam. Negative Homan's sign.  Labs: Lab Results   Component Value Date   WBC 8.0 03/22/2018   HGB 8.8 (L) 03/22/2018   HCT 27.5 (L) 03/22/2018   MCV 98.6 03/22/2018   PLT 186 03/22/2018   CMP Latest Ref Rng & Units 01/04/2018  Glucose 70 - 99 mg/dL 98  BUN 6 - 20 mg/dL 6  Creatinine 7.07 - 8.67 mg/dL 5.44  Sodium 920 - 100 mmol/L 135  Potassium 3.5 - 5.1 mmol/L 3.9  Chloride 98 - 111 mmol/L 109  CO2 22 - 32 mmol/L 21(L)  Calcium 8.9 - 10.3 mg/dL 8.1(L)  Total Protein 6.5 - 8.1 g/dL 4.6(L)  Total Bilirubin 0.3 - 1.2 mg/dL 0.7  Alkaline Phos 38 - 126 U/L 78  AST 15 - 41 U/L 13(L)  ALT 0 - 44 U/L 9    Discharge instruction: per After Visit Summary.  Medications:  Allergies as of 03/22/2018   No Known Allergies     Medication List    TAKE these medications   butalbital-acetaminophen-caffeine 50-325-40 MG tablet  Commonly known as:  FIORICET, ESGIC Take 1-2 tablets by mouth every 6 (six) hours as needed for headache.   CitraNatal Assure 35-1 & 300 MG tablet Take 2 tablets by mouth daily.   esomeprazole 20 MG capsule Commonly known as:  NexIUM Take 1 capsule (20 mg total) by mouth daily.   ferrous sulfate 325 (65 FE) MG tablet TAKE 1 TABLET BY MOUTH TWICE A DAY   oxyCODONE-acetaminophen 5-325 MG tablet Commonly known as:  PERCOCET/ROXICET Take 1-2 tablets by mouth every 6 (six) hours as needed.   thiamine 100 MG tablet Commonly known as:  VITAMIN B-1 Take 100 mg by mouth daily.   valACYclovir 500 MG tablet Commonly known as:  VALTREX Take 1 tablet (500 mg total) by mouth 2 (two) times daily.   vitamin B-12 1000 MCG tablet Commonly known as:  CYANOCOBALAMIN Take 1,000 mcg by mouth daily.   Vitamin D3 1.25 MG (50000 UT) Caps Take by mouth.       Diet: routine diet  Activity: Advance as tolerated. Pelvic rest for 6 weeks.   Outpatient follow up: Follow-up Information    Nadara Mustard, MD. Schedule an appointment as soon as possible for a visit in 2 week(s).   Specialty:  Obstetrics and  Gynecology Why:  Post Op Contact information: [address] [place] [address] [phone]             Postpartum contraception: Tubal Ligation Rhogam Given postpartum: no Rubella vaccine given postpartum: no Varicella vaccine given postpartum: no TDaP given antepartum or postpartum: No  Newborn Data: Live born female  Birth Weight: 9 lb 14.7 oz (4500 g) APGAR: 9, 10  Newborn Delivery   Birth date/time:  03/20/2018 13:20:00 Delivery type:  C-Section, Low Transverse C-section categorization:  Primary      Baby Feeding: Bottle  Disposition:home with mother  SIGNED: Letitia Libra, MD 03/22/2018 4:45 PM

## 2018-03-20 NOTE — Progress Notes (Signed)
  Labor Progress Note   38 y.o. [id number] @ [redacted]w[redacted]d , admitted for  Pregnancy, Labor Management.   Subjective:  Painful ctxs  Objective:  BP (!) 111/58   Pulse 92   Temp 97.9 F (36.6 C) (Oral)   Resp 18   Ht 5\' 6"  (1.676 m)   Wt 95.3 kg   LMP 06/20/2017 (Exact Date)   BMI 33.89 kg/m  Abd: gravid, ND, FHT present, moderate tenderness on exam Extr: trace to 1+ bilateral pedal edema SVE: CERVIX: 2 cm dilated, 70 effaced, ballottable station, presenting part difficult to tell us done- Complete Breech, head in LUQ  EFM: FHR: 140 bpm, variability: moderate,  accelerations:  Present,  decelerations:  Absent Toco: Frequency: Every 2-3 minutes Labs: I have reviewed the patient's lab results.   Assessment & Plan:  [id number] @ [redacted]w[redacted]d, admitted for  Pregnancy and Labor/Delivery Management  1. Pain management: none. 2. FWB: FHT category 1.  3. ID: GBS positive 4. Labor management: As is breech (was not earlier), but not a candidate for ECV, will proceed with Cesarean delivery As patient has expressed interest in having permanent sterility, will also perform tubal ligation  The risks of cesarean section discussed with the patient included but were not limited to: bleeding which may require transfusion or reoperation; infection which may require antibiotics; injury to bowel, bladder, ureters or other surrounding organs; injury to the fetus; need for additional procedures including hysterectomy in the event of a life-threatening hemorrhage; placental abnormalities wth subsequent pregnancies, incisional problems, thromboembolic phenomenon and other postoperative/anesthesia complications. The patient concurred with the proposed plan, giving informed written consent for the procedure.   The patient has been fully informed about all methods of contraception, both temporary and permanent. She understands that tubal ligation is meant to be permanent, absolute and irreversible. She was told that there is  an approximately 1 in 400 chance of a pregnancy in the future after tubal ligation. She was told the short and long term complications of tubal ligation. She understands the risks from this surgery include, but are not limited to, the risks of anesthesia, hemorrhage, infection, perforation, and injury to adjacent structures, bowel, bladder and blood vessels.   All discussed with patient, see orders  Annamarie Major, MD, [facility], [facility] 03/20/2018  12:11 PM

## 2018-03-20 NOTE — H&P (Signed)
History and Physical Interval Note:  03/20/2018 7:21 AM  Kristen Mcpherson  has presented today for INDUCTION OF LABOR (cervical ripening agents),  with the diagnosis of Favorable cervix at term. The various methods of treatment have been discussed with the patient and family. After consideration of risks, benefits and other options for treatment, the patient has consented to  Labor induction .  The patient's history has been reviewed, patient examined, no change in status, and is stable for induction as planned.  See H&P. I have reviewed the patient's chart and labs.  Questions were answered to the patient's satisfaction.    Annamarie Major, MD, [facility], [facility] 03/20/2018  7:21 AM

## 2018-03-20 NOTE — Anesthesia Preprocedure Evaluation (Addendum)
Anesthesia Evaluation  Patient identified by MRN, date of birth, ID band Patient awake    Reviewed: Allergy & Precautions, NPO status , Patient's Chart, lab work & pertinent test results  History of Anesthesia Complications Negative for: history of anesthetic complications  Airway Mallampati: II  TM Distance: >3 FB Neck ROM: Full    Dental no notable dental hx.    Pulmonary asthma (mild intermittent, rarely uses inhaler) , neg sleep apnea, former smoker,    breath sounds clear to auscultation- rhonchi (-) wheezing      Cardiovascular Exercise Tolerance: Good (-) hypertension(-) CAD, (-) Past MI, (-) Cardiac Stents and (-) CABG  Rhythm:Regular Rate:Normal - Systolic murmurs and - Diastolic murmurs    Neuro/Psych neg Seizures PSYCHIATRIC DISORDERS Anxiety Depression negative neurological ROS     GI/Hepatic negative GI ROS, Neg liver ROS,   Endo/Other  negative endocrine ROSneg diabetes  Renal/GU negative Renal ROS     Musculoskeletal negative musculoskeletal ROS (+)   Abdominal (+) + obese, Gravid abdomen  Peds  Hematology  (+) anemia ,   Anesthesia Other Findings Past Medical History: No date: Anemia No date: Anxiety No date: Asthma 01/08/2018: Iron deficiency anemia   Reproductive/Obstetrics (+) Pregnancy                             Past Medical History:  Diagnosis Date  . Anemia   . Anxiety   . Asthma   . Iron deficiency anemia 01/08/2018    Anesthesia Physical Anesthesia Plan  ASA: II and emergent  Anesthesia Plan: Spinal   Post-op Pain Management:    Induction:   PONV Risk Score and Plan: 2 and Ondansetron  Airway Management Planned: Natural Airway  Additional Equipment:   Intra-op Plan:   Post-operative Plan:   Informed Consent: I have reviewed the patients History and Physical, chart, labs and discussed the procedure including the risks, benefits and  alternatives for the proposed anesthesia with the patient or authorized representative who has indicated his/her understanding and acceptance.     Dental advisory given  Plan Discussed with: CRNA and Anesthesiologist  Anesthesia Plan Comments: (Breech in labor)       Anesthesia Quick Evaluation

## 2018-03-20 NOTE — Progress Notes (Signed)
  Labor Progress Note   38 y.o. [id number] @ [redacted]w[redacted]d , admitted for  Pregnancy, Labor Management.   Subjective:  Mild pain w ctxs  Objective:  BP 109/62   Pulse 87   Temp 97.7 F (36.5 C) (Oral)   Resp 18   Ht 5\' 6"  (1.676 m)   Wt 95.3 kg   LMP 06/20/2017 (Exact Date)   BMI 33.89 kg/m  Abd: gravid, ND, FHT present, mild tenderness on exam Extr: trace to 1+ bilateral pedal edema SVE: CERVIX: 2 cm dilated, 70 effaced, -3 station VAGINA: no abnormalities noted MEMBRANES: intact  EFM: FHR: 149 bpm, variability: moderate,  accelerations:  Present,  decelerations:  Absent Toco: Frequency: Every 5-10 minutes Labs: I have reviewed the patient's lab results.   Assessment & Plan:  [id number] @ [redacted]w[redacted]d, admitted for  Pregnancy and Labor/Delivery Management  1. Pain management: none. 2. FWB: FHT category 1.  3. ID: GBS negative 4. Labor management: Pitocin now, AROM when appropriate Desires epidural  All discussed with patient, see orders  Annamarie Major, MD, [facility], [facility] 03/20/2018  10:04 AM

## 2018-03-20 NOTE — Op Note (Signed)
Cesarean Section Procedure Note Indications: malpresentation: Complete Breech and term intrauterine pregnancy, Desire for permanent sterility  Pre-operative Diagnosis: Intrauterine pregnancy [redacted]w[redacted]d ;  malpresentation: Complete Breech and term intrauterine pregnancy, Desire for permanent sterility Post-operative Diagnosis: same, delivered. Procedure: Low Transverse Cesarean Section, Bilateral Tubal Ligation Surgeon: Annamarie Major, MD Assistant(s): Dr Jerene Pitch, No other capable assistant available, in surgery requiring high level assistant. Anesthesia: Spinal anesthesia Estimated Blood Loss:400 Complications: None; patient tolerated the procedure well. Disposition: PACU - hemodynamically stable. Condition: stable  Findings: A female infant in the breech (complete) presentation. Amniotic fluid - Clear  Birth weight 9-15 lbs.  Apgars of 9 and 10.  Intact placenta with a three-vessel cord. Grossly normal uterus, tubes and ovaries bilaterally. No intraabdominal adhesions were noted.  Procedure Details   The patient was taken to Operating Room, identified as the correct patient and the procedure verified as C-Section Delivery. A Time Out was held and the above information confirmed. After induction of anesthesia, the patient was draped and prepped in the usual sterile manner. A Pfannenstiel incision was made and carried down through the subcutaneous tissue to the fascia. Fascial incision was made and extended transversely with the Mayo scissors. The fascia was separated from the underlying rectus tissue superiorly and inferiorly. The peritoneum was identified and entered bluntly. Peritoneal incision was extended longitudinally. The utero-vesical peritoneal reflection was incised transversely and a bladder flap was created digitally.  A low transverse hysterotomy was made. The fetus was delivered atraumatically. The umbilical cord was clamped x2 and cut and the infant was handed to the awaiting  pediatricians. The placenta was removed intact and appeared normal with a 3-vessel cord.  The uterus was exteriorized and cleared of all clot and debris. The hysterotomy was closed with running sutures of 0 Vicryl suture. A second imbricating layer was placed with the same suture. Excellent hemostasis was observed.   The left Fallopian tube was identified, grasped with the Babcock clamps, lifted to the skin incision and followed out distally to the fimbriae. An avascular midsection of the tube approximately 3-4cm from the cornua was grasped with the babcock clamps and brought into a knuckle at the skin incision. The tube was double ligated with 2-0 Vicryl suture and the intervening portion of tube was transected and removed. Excellent hemostasis was noted and the tube was returned to the abdomen. Attention was then turned to the right fallopian tube after confirmation of identification by tracing the tube out to the fimbriae. The same procedure was then performed on the right Fallopian tube. Again, excellent hemostasis was noted at the end of the procedure.  The uterus was returned to the abdomen. The pelvis was irrigated and again, excellent hemostasis was noted.  The On Q Pain pump System was then placed.  Trocars were placed through the abdominal wall into the subfascial space and these were used to thread the silver soaker cathaters into place.The rectus fascia was then reapproximated with running sutures of Maxon, with careful placement not to incorporate the cathaters. Subcutaneous tissues are then irrigated with saline and hemostasis assured.  Skin is then closed with 4-0 vicryl suture in a subcuticular fashion followed by skin adhesive. The cathaters are flushed each with 5 mL of Bupivicaine and stabilized into place with dressing. Instrument, sponge, and needle counts were correct prior to the abdominal closure and at the conclusion of the case.  The patient tolerated the procedure well and was  transferred to the recovery room in stable condition.   Annamarie Major,  MD, [facility], [facility] 03/20/2018  2:08 PM

## 2018-03-20 NOTE — Anesthesia Procedure Notes (Addendum)
Spinal  Patient location during procedure: OR Start time: 03/20/2018 12:55 PM End time: 03/20/2018 1:08 PM Staffing Anesthesiologist: Alver Fisher, MD Resident/CRNA: Dava Najjar, CRNA Performed: anesthesiologist and resident/CRNA  Preanesthetic Checklist Completed: patient identified, site marked, surgical consent, pre-op evaluation, timeout performed, IV checked, risks and benefits discussed and monitors and equipment checked Spinal Block Patient position: sitting Prep: ChloraPrep Patient monitoring: heart rate, continuous pulse ox and blood pressure Approach: midline Location: L3-4 Injection technique: single-shot Needle Needle type: Introducer and Pencil-Tip  Needle gauge: 24 G Needle length: 9 cm Assessment Sensory level: T4

## 2018-03-20 NOTE — Discharge Instructions (Signed)

## 2018-03-20 NOTE — Anesthesia Post-op Follow-up Note (Signed)
Anesthesia QCDR form completed.        

## 2018-03-20 NOTE — Transfer of Care (Signed)
Immediate Anesthesia Transfer of Care Note  Patient: Kristen Mcpherson  Procedure(s) Performed: CESAREAN SECTION (N/A ) BILATERAL TUBAL LIGATION (Bilateral )  Patient Location: Mother/Baby  Anesthesia Type:Spinal  Level of Consciousness: awake, alert , oriented and patient cooperative  Airway & Oxygen Therapy: Patient Spontanous Breathing  Post-op Assessment: Report given to RN and Post -op Vital signs reviewed and stable  Post vital signs: Reviewed and stable  Last Vitals:  Vitals Value Taken Time  BP 95/37 (52)   Temp    Pulse 97 03/20/2018  2:12 PM  Resp 11 03/20/2018  2:12 PM  SpO2 98 % 03/20/2018  2:12 PM  Vitals shown include unvalidated device data.  Last Pain:  Vitals:   03/20/18 1100  TempSrc: Oral  PainSc:          Complications: No apparent anesthesia complications

## 2018-03-21 ENCOUNTER — Encounter: Payer: Self-pay | Admitting: Certified Nurse Midwife

## 2018-03-21 LAB — CBC
HCT: 29.6 % — ABNORMAL LOW (ref 36.0–46.0)
Hemoglobin: 9.6 g/dL — ABNORMAL LOW (ref 12.0–15.0)
MCH: 31.5 pg (ref 26.0–34.0)
MCHC: 32.4 g/dL (ref 30.0–36.0)
MCV: 97 fL (ref 80.0–100.0)
Platelets: 189 10*3/uL (ref 150–400)
RBC: 3.05 MIL/uL — ABNORMAL LOW (ref 3.87–5.11)
RDW: 14.7 % (ref 11.5–15.5)
WBC: 12.3 10*3/uL — ABNORMAL HIGH (ref 4.0–10.5)
nRBC: 0 % (ref 0.0–0.2)

## 2018-03-21 MED ORDER — LACTATED RINGERS IV BOLUS
500.0000 mL | Freq: Once | INTRAVENOUS | Status: AC
  Administered 2018-03-21: 500 mL via INTRAVENOUS

## 2018-03-21 MED ORDER — IBUPROFEN 800 MG PO TABS
800.0000 mg | ORAL_TABLET | Freq: Three times a day (TID) | ORAL | Status: DC | PRN
  Administered 2018-03-21 – 2018-03-22 (×2): 800 mg via ORAL
  Filled 2018-03-21 (×4): qty 1

## 2018-03-21 MED ORDER — ACETAMINOPHEN 325 MG PO TABS
650.0000 mg | ORAL_TABLET | Freq: Four times a day (QID) | ORAL | Status: AC | PRN
  Administered 2018-03-21 – 2018-03-22 (×3): 650 mg via ORAL
  Filled 2018-03-21 (×4): qty 2

## 2018-03-21 NOTE — Anesthesia Postprocedure Evaluation (Signed)
Anesthesia Post Note  Patient: Kristen Mcpherson  Procedure(s) Performed: CESAREAN SECTION (N/A ) BILATERAL TUBAL LIGATION (Bilateral )  Patient location during evaluation: Mother Baby Anesthesia Type: Spinal Level of consciousness: oriented and awake and alert Pain management: pain level controlled Vital Signs Assessment: post-procedure vital signs reviewed and stable Respiratory status: spontaneous breathing and respiratory function stable Cardiovascular status: blood pressure returned to baseline and stable Postop Assessment: no headache, no backache, no apparent nausea or vomiting and able to ambulate Anesthetic complications: no     Last Vitals:  Vitals:   03/21/18 0424 03/21/18 0500  BP: 96/60   Pulse: 71 83  Resp:    Temp:    SpO2:  100%    Last Pain:  Vitals:   03/21/18 0555  TempSrc:   PainSc: Asleep                 Karoline Caldwell

## 2018-03-21 NOTE — Progress Notes (Signed)
POD #1 LTCS for breech presentation and BTL Subjective:   Feels weak and tired. Drinking fluids and eating some regular food. Voided once since the foley was removed at 0100 this morning. Getting roxicodone and Toradol and Tylenol for pain.  Objective:  Blood pressure (!) 89/59, pulse 70, temperature 98.2 F (36.8 C), temperature source Oral, resp. rate 20, height 5\' 6"  (1.676 m), weight 95.3 kg, last menstrual period 06/20/2017, SpO2 99 %, unknown if currently breastfeeding. Patient Vitals for the past 24 hrs:  BP Temp Temp src Pulse Resp SpO2  03/21/18 0759 (!) 89/59 98.2 F (36.8 C) Oral 70 20 99 %  03/21/18 0500 - - - 83 - 100 %  03/21/18 0424 96/60 - - 71 - -  03/21/18 0404 (!) 90/47 97.9 F (36.6 C) Oral 71 20 100 %  03/21/18 0300 - - - 68 - 98 %  03/21/18 0100 - - - 75 - 98 %  03/21/18 0047 107/62 98.6 F (37 C) Axillary 76 19 96 %  03/20/18 2300 - - - 69 - 100 %  03/20/18 2100 - - - 86 - 100 %  03/20/18 2051 107/67 98.3 F (36.8 C) Oral 75 - -  03/20/18 1915 101/70 98.4 F (36.9 C) Oral 75 20 98 %  03/20/18 1900 - - - 76 - 99 %  03/20/18 1817 100/62 98.2 F (36.8 C) Oral 79 20 97 %  03/20/18 1648 100/60 98 F (36.7 C) - 77 18 98 %  03/20/18 1515 98/69 - - 85 20 99 %  03/20/18 1500 90/70 - - 98 (!) 24 100 %  03/20/18 1445 102/78 - - (!) 106 14 100 %  03/20/18 1417 (!) 100/34 - - (!) 103 (!) 21 100 %  03/20/18 1100 (!) 111/58 97.9 F (36.6 C) Oral 92 - -   Intake for past 24 hours 977 + breakfast. Output= 550+150=718ml General: WF in NAD, appears tired, answering questions appropriately Pulmonary: no increased work of breathing/ CTAB Heart: RRR without murmur Abdomen: non-distended, non-tender, BS present x4 Incision: Honeycomb dressing C&D&I, ON Q intact Extremities: no edema, no erythema, no tenderness  Results for orders placed or performed during the hospital encounter of 03/20/18 (from the past 72 hour(s))  CBC     Status: Abnormal   Collection Time: 03/20/18   5:44 AM  Result Value Ref Range   WBC 6.9 4.0 - 10.5 K/uL   RBC 3.68 (L) 3.87 - 5.11 MIL/uL   Hemoglobin 11.7 (L) 12.0 - 15.0 g/dL   HCT 21.1 (L) 15.5 - 20.8 %   MCV 95.9 80.0 - 100.0 fL   MCH 31.8 26.0 - 34.0 pg   MCHC 33.1 30.0 - 36.0 g/dL   RDW 02.2 33.6 - 12.2 %   Platelets 234 150 - 400 K/uL   nRBC 0.0 0.0 - 0.2 %    Comment: Performed at [facility], [address] [place], [place] [address]  Type and screen     Status: None   Collection Time: 03/20/18  5:44 AM  Result Value Ref Range   ABO/RH(D) A POS    Antibody Screen NEG    Sample Expiration      03/23/2018 Performed at [facility], [address] [place], [place] [address]   CBC     Status: Abnormal   Collection Time: 03/21/18  6:12 AM  Result Value Ref Range   WBC 12.3 (H) 4.0 - 10.5 K/uL   RBC 3.05 (L) 3.87 -  5.11 MIL/uL   Hemoglobin 9.6 (L) 12.0 - 15.0 g/dL   HCT 93.2 (L) 35.5 - 73.2 %   MCV 97.0 80.0 - 100.0 fL   MCH 31.5 26.0 - 34.0 pg   MCHC 32.4 30.0 - 36.0 g/dL   RDW 20.2 54.2 - 70.6 %   Platelets 189 150 - 400 K/uL   nRBC 0.0 0.0 - 0.2 %    Comment: Performed at [facility], [address], [place], [place] [address]     Assessment:   38 y.o. [id number] postoperativeday # 1  Low blood pressure, but not tachycardic  Appropriate drop in hemoglobin from surgery  Pain is appropriate, no evidence of internal bleeding   Plan:  1) Acute blood loss anemia - feels tired and weak and blood pressure low this AM  LR bolus of 500 ml over the next hour.   Po iron and vitamins  Monitor blood pressures/ assist OOB  2)A POS/ RI/ VI  3) TDAP -declined during pregnancy-last TDAP?  4)Bottle/Contraception-BTL  5) Disposition-anticipate POD #3    Farrel Conners, CNM

## 2018-03-21 NOTE — Anesthesia Post-op Follow-up Note (Signed)
  Anesthesia Pain Follow-up Note  Patient: Kristen Mcpherson  Day #: 1  Date of Follow-up: 03/21/2018 Time: 7:44 AM  Last Vitals:  Vitals:   03/21/18 0424 03/21/18 0500  BP: 96/60   Pulse: 71 83  Resp:    Temp:    SpO2:  100%    Level of Consciousness: alert  Pain: none   Side Effects:None  Catheter Site Exam:clean, dry, no drainage     Plan: D/C from anesthesia care at surgeon's request  Karoline Caldwell

## 2018-03-22 ENCOUNTER — Other Ambulatory Visit: Payer: Self-pay | Admitting: Obstetrics & Gynecology

## 2018-03-22 LAB — CBC
HCT: 27.5 % — ABNORMAL LOW (ref 36.0–46.0)
Hemoglobin: 8.8 g/dL — ABNORMAL LOW (ref 12.0–15.0)
MCH: 31.5 pg (ref 26.0–34.0)
MCHC: 32 g/dL (ref 30.0–36.0)
MCV: 98.6 fL (ref 80.0–100.0)
Platelets: 186 10*3/uL (ref 150–400)
RBC: 2.79 MIL/uL — ABNORMAL LOW (ref 3.87–5.11)
RDW: 14.9 % (ref 11.5–15.5)
WBC: 8 10*3/uL (ref 4.0–10.5)
nRBC: 0 % (ref 0.0–0.2)

## 2018-03-22 LAB — RPR: RPR Ser Ql: NONREACTIVE

## 2018-03-22 MED ORDER — OXYCODONE-ACETAMINOPHEN 5-325 MG PO TABS: 1.0000 | ORAL_TABLET | Freq: Four times a day (QID) | ORAL | 0 refills | Status: DC | PRN

## 2018-03-22 NOTE — Progress Notes (Signed)
Admit Date: 03/20/2018 Today's Date: 03/22/2018  Subjective: Postpartum Day 2: Cesarean Delivery Patient reports incisional pain, tolerating PO, + flatus and no problems voiding.      Improved UOP this am  Objective: Vital signs in last 24 hours: Temp:  [97.5 F (36.4 C)-98.2 F (36.8 C)] 98.2 F (36.8 C) (03/21 0740) Pulse Rate:  [75-88] 88 (03/21 0740) Resp:  [18-20] 20 (03/21 0740) BP: (90-106)/(46-67) 99/67 (03/21 0740) SpO2:  [96 %-100 %] 99 % (03/21 0740)  Physical Exam:  General: alert, cooperative and no distress Lochia: appropriate Uterine Fundus: firm Incision: healing well, no significant drainage, no dehiscence, no significant erythema DVT Evaluation: No evidence of DVT seen on physical exam. Negative Homan's sign.  Recent Labs    03/21/18 0612 03/22/18 0322  HGB 9.6* 8.8*  HCT 29.6* 27.5*    Assessment/Plan: Status post Cesarean section. Doing well postoperatively.  Continue current care. Consider discharge tomorrow Ambulate, diet BTL done!  Letitia Libra 03/22/2018, 9:55 AM

## 2018-03-22 NOTE — Progress Notes (Signed)
Progress Note   CTSP due to pain and nurses palpating small firm moveable knot below umbilicus.  Patient reports having a burning sensation in the RLQ and feeling " like something is ripping open when she moves." She was cathed for around 150 ml urine last night around 1900 when she reported feeling like she needed to void, but couldn't, unless she leaned forward and pushed. Bladder scan showed about 600 ml urine, but foley drained only 150 ml. Catheter was removed and she has voided 200 ml clear yellow urine since then. Has been getting 5 mgm oxycodone and now just received 10 mgm oxycodone now.  General: Sitting up feeding baby, answering questions appropriately Vital signs: BP (!) 106/57 (BP Location: Right Arm)   Pulse 79   Temp (!) 97.5 F (36.4 C) (Oral) Comment: nurse notified  Resp 18   Ht 5\' 6"  (1.676 m)   Wt 95.3 kg   LMP 06/20/2017 (Exact Date)   SpO2 96%   Breastfeeding Unknown   BMI 33.89 kg/m   Abdomen: soft, non distended, no guarding. FF at U/ML/NT Incision: C&D&I honey comb dressing, ON Q intact  A: Increased abdominal pain, unsure of etiology  P: CBC now  Farrel Conners, CNM

## 2018-03-22 NOTE — Progress Notes (Signed)
Colleen, CNM notified of patient continuously complaining of 7 out of 10 pain after medication given. Minimal urine output and lower abdominal pressure still present. Upon palpating fundus, a new onset small, firm, and movable knot felt 1 below umbilicus. BP 91/46 and 106/57. CNM to come assess patient. Will continue to monitor.

## 2018-03-24 LAB — SURGICAL PATHOLOGY

## 2018-04-03 ENCOUNTER — Ambulatory Visit (INDEPENDENT_AMBULATORY_CARE_PROVIDER_SITE_OTHER): Payer: Medicaid Other | Admitting: Obstetrics & Gynecology

## 2018-04-03 ENCOUNTER — Encounter: Payer: Self-pay | Admitting: Obstetrics & Gynecology

## 2018-04-03 ENCOUNTER — Other Ambulatory Visit: Payer: Self-pay

## 2018-04-03 VITALS — BP 120/80 | Ht 66.0 in | Wt 185.0 lb

## 2018-04-03 DIAGNOSIS — O321XX0 Maternal care for breech presentation, not applicable or unspecified: Secondary | ICD-10-CM | POA: Insufficient documentation

## 2018-04-03 HISTORY — DX: Maternal care for breech presentation, not applicable or unspecified: O32.1XX0

## 2018-04-03 NOTE — Progress Notes (Signed)
  Postoperative Follow-up Patient presents post op from CS BTL for breech and desire for sterility, 2 weeks ago.  Subjective: Patient reports marked improvement in her preop symptoms. Eating a regular diet without difficulty. Pain is controlled with current analgesics. Medications being used: acetaminophen.  Activity: sedentary. Patient reports additional symptom's since surgery of irreg bleeding.  Objective: BP 120/80   Ht 5\' 6"  (1.676 m)   Wt 185 lb (83.9 kg)   LMP 06/20/2017 (Exact Date)   BMI 29.86 kg/m  Physical Exam Constitutional:      General: She is not in acute distress.    Appearance: She is well-developed.  Cardiovascular:     Rate and Rhythm: Normal rate.  Pulmonary:     Effort: Pulmonary effort is normal.  Abdominal:     General: There is no distension.     Palpations: Abdomen is soft.     Tenderness: There is no abdominal tenderness.     Comments: Incision Healing Well   Musculoskeletal: Normal range of motion.  Neurological:     Mental Status: She is alert and oriented to person, place, and time.     Cranial Nerves: No cranial nerve deficit.  Skin:    General: Skin is warm and dry.     Assessment: s/p :  cesarean section and tubal ligation progressing well  Plan: Patient has done well after surgery with no apparent complications.  I have discussed the post-operative course to date, and the expected progress moving forward.  The patient understands what complications to be concerned about.  I will see the patient in routine follow up, or sooner if needed.    Activity plan: No heavy lifting.Marland Kitchen  Pelvic rest.  Consider Mirena IUD in future for period control as she has history of menorrhagia hat was well treated w this in the past  Letitia Libra 04/03/2018, 1:52 PM

## 2018-04-08 ENCOUNTER — Other Ambulatory Visit: Payer: Self-pay

## 2018-04-09 ENCOUNTER — Inpatient Hospital Stay: Payer: Medicaid Other

## 2018-04-12 ENCOUNTER — Other Ambulatory Visit: Payer: Self-pay

## 2018-04-14 NOTE — Telephone Encounter (Signed)
Postpartum visit was 4/2 with RPH. Please advise

## 2018-04-15 ENCOUNTER — Ambulatory Visit (INDEPENDENT_AMBULATORY_CARE_PROVIDER_SITE_OTHER): Payer: Medicaid Other | Admitting: Obstetrics and Gynecology

## 2018-04-15 ENCOUNTER — Other Ambulatory Visit: Payer: Self-pay

## 2018-04-15 VITALS — BP 124/78 | Temp 97.6°F | Wt 186.0 lb

## 2018-04-15 DIAGNOSIS — T8141XA Infection following a procedure, superficial incisional surgical site, initial encounter: Secondary | ICD-10-CM

## 2018-04-15 MED ORDER — CEPHALEXIN 500 MG PO CAPS: 500.0000 mg | ORAL_CAPSULE | Freq: Four times a day (QID) | ORAL | 0 refills | Status: AC

## 2018-04-15 NOTE — Progress Notes (Signed)
   Postoperative Follow-up Patient presents post op from cesarean section with tubal ligation  4 weeks ago.  Subjective: She denies fever, chills, nausea and vomiting. Eating a regular diet without difficulty. The patient is not having any pain.  Activity: increasing slowly. She reports issues with her incision.  She states that there is more tenderness in her midline. She has also noted some pus coming from a small opening in the incision.   Objective: BP 124/78   Temp 97.6 F (36.4 C)   Wt 186 lb (84.4 kg)   LMP 06/20/2017 (Exact Date)   BMI 30.02 kg/m   Constitutional: Well nourished, well developed female in no acute distress.  HEENT: normal Skin: Warm and dry.  Abdomen: Soft, non-tender, normal bowel sounds; no bruits, organomegaly or masses. There is only mild erythema with the incision. There are two punctate defects in the lesion.  Pus is expressed from the defect on the right. These are located around the midline to just right of the midline. There is minimal induration along the incision. There is mild tenderness where the defect exists. There is mild warmth.  The defects are attempted to be probed with a Q-tip, but I am unable to advance the Q-tip into the incision. This is also a very tender spot for the patient.   Extremity: no edema   Assessment: 38 y.o. s/p cesarean section with tubal ligation with a mild superficial surgical site infection.  Plan: Patient has done well after surgery with no apparent complications.  I have discussed the post-operative course to date, and the expected progress moving forward.  The patient understands what complications to be concerned about.    Superficial surgical site infection: will start Keflex and have her follow up with Dr. Tiburcio Pea later this week.  Activity plan: limit activity to no weight bearing greater than 25 pounds  Return in about 3 days (around 04/18/2018) for Post op incision check w Dr. Tiburcio Pea .  Thomasene Mohair, MD  04/15/2018 2:16 PM

## 2018-04-16 ENCOUNTER — Encounter: Payer: Self-pay | Admitting: Obstetrics and Gynecology

## 2018-04-16 MED ORDER — BUTALBITAL-APAP-CAFFEINE 50-325-40 MG PO TABS: 1.0000 | ORAL_TABLET | Freq: Four times a day (QID) | ORAL | 0 refills | Status: DC | PRN

## 2018-04-18 ENCOUNTER — Encounter: Payer: Self-pay | Admitting: Obstetrics & Gynecology

## 2018-04-18 ENCOUNTER — Ambulatory Visit (INDEPENDENT_AMBULATORY_CARE_PROVIDER_SITE_OTHER): Payer: Medicaid Other | Admitting: Obstetrics & Gynecology

## 2018-04-18 ENCOUNTER — Other Ambulatory Visit: Payer: Self-pay

## 2018-04-18 VITALS — BP 120/80 | Wt 187.0 lb

## 2018-04-18 DIAGNOSIS — T8141XA Infection following a procedure, superficial incisional surgical site, initial encounter: Secondary | ICD-10-CM

## 2018-04-18 NOTE — Progress Notes (Signed)
  History of Present Illness:  Kristen Mcpherson is a 38 y.o. who was started on Keflex for incisional cellulitis with some drainage noted earlier this week; this is after CS and BTL approximately 4 weeks ago. Since that time, she states that her symptoms are improving.  Still has some drainage and soreness.  No fever.  No skin erythema or incisional separation.  PMHx: She  has a past medical history of Anemia, Anxiety, Asthma, and Iron deficiency anemia (01/08/2018). Also,  has a past surgical history that includes Appendectomy; Cesarean section (N/A, 03/20/2018); and Tubal ligation (Bilateral, 03/20/2018)., family history includes ADD / ADHD in her brother; Anemia in her mother; Anxiety disorder in her mother; Arthritis in her mother; Depression in her father and mother; Diabetes in her mother; Hyperlipidemia in her father; Hypertension in her father.,  reports that she quit smoking about 15 months ago. She has a 7.50 pack-year smoking history. She has never used smokeless tobacco. She reports previous alcohol use. She reports that she does not use drugs. No outpatient medications have been marked as taking for the 04/18/18 encounter (Postpartum Visit) with Nadara Mustard, MD.  . Also, has No Known Allergies..  Review of Systems  All other systems reviewed and are negative.  Physical Exam:  BP 120/80   Wt 187 lb (84.8 kg)   LMP 06/20/2017 (Exact Date)   BMI 30.18 kg/m  Body mass index is 30.18 kg/m. Constitutional: Well nourished, well developed female in no acute distress.  Abdomen: diffusely non tender to palpation, non distended, and no masses, hernias Incision: no erythema, no separation, no expressable drainage, no mass or loculations palpated Neuro: Grossly intact Psych:  Normal mood and affect.    Assessment:   Superficial incisional surgical site infection    -  Primary     Medication treatment is going well for her cellulitis.  Plan: She will undergo completion in her medical  therapy.  She was amenable to this plan and we will see her in 2 weeks for post partum check, sooner if sx's worsen  A total of 15 minutes were spent face-to-face with the patient during this encounter and over half of that time dealt with counseling and coordination of care.  Annamarie Major, MD, [facility], [facility] 04/18/2018  3:21 PM

## 2018-05-01 ENCOUNTER — Other Ambulatory Visit (HOSPITAL_COMMUNITY)
Admission: RE | Admit: 2018-05-01 | Discharge: 2018-05-01 | Disposition: A | Discharge status: Home / Self Care | Payer: Medicaid Other | Source: Ambulatory Visit | Attending: Obstetrics & Gynecology | Admitting: Obstetrics & Gynecology

## 2018-05-01 ENCOUNTER — Ambulatory Visit (INDEPENDENT_AMBULATORY_CARE_PROVIDER_SITE_OTHER): Payer: Medicaid Other | Admitting: Obstetrics & Gynecology

## 2018-05-01 ENCOUNTER — Encounter: Payer: Self-pay | Admitting: Obstetrics & Gynecology

## 2018-05-01 ENCOUNTER — Other Ambulatory Visit: Payer: Self-pay

## 2018-05-01 VITALS — BP 120/80 | Ht 63.0 in | Wt 193.0 lb

## 2018-05-01 DIAGNOSIS — R87613 High grade squamous intraepithelial lesion on cytologic smear of cervix (HGSIL): Secondary | ICD-10-CM | POA: Insufficient documentation

## 2018-05-01 DIAGNOSIS — Z1389 Encounter for screening for other disorder: Secondary | ICD-10-CM

## 2018-05-01 DIAGNOSIS — Z30430 Encounter for insertion of intrauterine contraceptive device: Secondary | ICD-10-CM | POA: Diagnosis not present

## 2018-05-01 MED ORDER — PHENTERMINE HCL 37.5 MG PO TABS: ORAL_TABLET | ORAL | 0 refills | Status: DC

## 2018-05-01 NOTE — Patient Instructions (Signed)

## 2018-05-01 NOTE — Progress Notes (Signed)
  OBSTETRICS POSTPARTUM CLINIC PROGRESS NOTE  Subjective:     Kristen Mcpherson is a 38 y.o. [id number] female who presents for a postpartum visit. She is 6 weeks postpartum following a Term pregnancy and delivery by C-section.  I have fully reviewed the prenatal and intrapartum course. Anesthesia: spinal.  Postpartum course has been complicated by uncomplicated.  Baby is feeding by Bottle.  Bleeding: patient has  resumed menses.  Bowel function is normal. Bladder function is normal.  Patient is not sexually active. Contraception method desired is IUD.  Postpartum depression screening: negative. Edinburgh 6.  The following portions of the patient's history were reviewed and updated as appropriate: allergies, current medications, past family history, past medical history, past social history, past surgical history and problem list.  Review of Systems Pertinent items are noted in HPI.  Objective:    BP 120/80   Ht 5\' 3"  (1.6 m)   Wt 193 lb (87.5 kg)   LMP 04/30/2018   BMI 34.19 kg/m   General:  alert and no distress   Breasts:  inspection negative, no nipple discharge or bleeding, no masses or nodularity palpable  Lungs: clear to auscultation bilaterally  Heart:  regular rate and rhythm, S1, S2 normal, no murmur, click, rub or gallop  Abdomen: soft, non-tender; bowel sounds normal; no masses,  no organomegaly.  Well healed Pfannenstiel incision   Vulva:  normal  Vagina: normal vagina, no discharge, exudate, lesion, or erythema  Cervix:  no cervical motion tenderness and no lesions  Corpus: normal size, contour, position, consistency, mobility, non-tender  Adnexa:  normal adnexa and no mass, fullness, tenderness  Rectal Exam: Not performed.          Assessment:  Post Partum Care visit 1. Encounter for insertion of mirena IUD See below, Mirena For period control as well as birth control (h/o menorrhagia)  2. Postpartum care and examination Also Obesity and weight gain concerns  Has taken meds in past to help Concerned for gaining weight back at this point Pros and cons of meds discussed. Phentermine Rx  3. HGSIL (high grade squamous intraepithelial lesion) on Pap smear of cervix- last PAP in pregnancy. - Cytology - PAP today - Biopsies and LEEP discussed as options  Plan:  See orders and Patient Instructions Follow up in: 1 month or as needed.    IUD PROCEDURE NOTE:  Kristen Mcpherson is a 38 y.o. [id number] here for IUD insertion. No GYN concerns.  Last pap smear was today.  IUD Insertion Procedure Note Patient identified, informed consent performed, consent signed.   Discussed risks of irregular bleeding, cramping, infection, malpositioning or misplacement of the IUD outside the uterus which may require further procedure such as laparoscopy, risk of failure <1%. Time out was performed.  Urine pregnancy test negative.  A bimanual exam showed the uterus to be midposition.  Speculum placed in the vagina.  Cervix visualized.  Cleaned with Betadine x 2.  Grasped anteriorly with a single tooth tenaculum.  Uterus sounded to 8 cm.   IUD placed per manufacturer's recommendations.  Strings trimmed to 3 cm. Tenaculum was removed, good hemostasis noted.  Patient tolerated procedure well.   Patient was given post-procedure instructions.  She was advised to have backup contraception for one week.  Patient was also asked to check IUD strings periodically and follow up in 4 weeks for IUD check.  Annamarie Major, MD, [facility], [facility] 05/01/2018  2:17 PM

## 2018-05-06 ENCOUNTER — Telehealth: Payer: Self-pay | Admitting: Obstetrics & Gynecology

## 2018-05-06 LAB — CYTOLOGY - PAP

## 2018-05-06 NOTE — Telephone Encounter (Signed)
Appointment changed from Follow up to [facility]

## 2018-05-06 NOTE — Telephone Encounter (Signed)
-----   Message from Nadara Mustard, MD sent at 05/06/2018  9:49 AM EDT ----- Change appt on 5/28 to colpo; change time if needed for procedure room

## 2018-05-06 NOTE — Progress Notes (Signed)
Change appt on 5/28 to colpo; change time if needed for procedure room

## 2018-05-07 ENCOUNTER — Inpatient Hospital Stay: Payer: Medicaid Other

## 2018-05-29 ENCOUNTER — Encounter: Payer: Self-pay | Admitting: Obstetrics & Gynecology

## 2018-05-29 ENCOUNTER — Other Ambulatory Visit (HOSPITAL_COMMUNITY)
Admission: RE | Admit: 2018-05-29 | Discharge: 2018-05-29 | Disposition: A | Discharge status: Home / Self Care | Payer: Medicaid Other | Source: Ambulatory Visit | Attending: Obstetrics & Gynecology | Admitting: Obstetrics & Gynecology

## 2018-05-29 ENCOUNTER — Ambulatory Visit: Payer: Medicaid Other | Admitting: Obstetrics & Gynecology

## 2018-05-29 ENCOUNTER — Other Ambulatory Visit: Payer: Self-pay

## 2018-05-29 ENCOUNTER — Ambulatory Visit (INDEPENDENT_AMBULATORY_CARE_PROVIDER_SITE_OTHER): Payer: Medicaid Other | Admitting: Obstetrics & Gynecology

## 2018-05-29 VITALS — BP 100/70 | Ht 66.0 in | Wt 191.0 lb

## 2018-05-29 DIAGNOSIS — R87612 Low grade squamous intraepithelial lesion on cytologic smear of cervix (LGSIL): Secondary | ICD-10-CM

## 2018-05-29 DIAGNOSIS — D060 Carcinoma in situ of endocervix: Secondary | ICD-10-CM

## 2018-05-29 DIAGNOSIS — Z30431 Encounter for routine checking of intrauterine contraceptive device: Secondary | ICD-10-CM

## 2018-05-29 DIAGNOSIS — E669 Obesity, unspecified: Secondary | ICD-10-CM

## 2018-05-29 MED ORDER — PHENTERMINE HCL 37.5 MG PO TABS: ORAL_TABLET | ORAL | 0 refills | Status: DC

## 2018-05-29 MED ORDER — TOPIRAMATE 25 MG PO TABS: 25.0000 mg | ORAL_TABLET | Freq: Two times a day (BID) | ORAL | 4 refills | Status: DC

## 2018-05-29 NOTE — Patient Instructions (Signed)
Colposcopy, Care After  This sheet gives you information about how to care for yourself after your procedure. Your health care provider may also give you more specific instructions. If you have problems or questions, contact your health care provider.  What can I expect after the procedure?  If you had a colposcopy without a biopsy, you can expect to feel fine right away, but you may have some spotting for a few days. You can go back to your normal activities.  If you had a colposcopy with a biopsy, it is common to have:   Soreness and pain. This may last for a few days.   Light-headedness.   Mild vaginal bleeding or dark-colored, grainy discharge. This may last for a few days. The discharge may be due to a solution that was used during the procedure. You may need to wear a sanitary pad during this time.   Spotting for at least 48 hours after the procedure.  Follow these instructions at home:     Take over-the-counter and prescription medicines only as told by your health care provider. Talk with your health care provider about what type of over-the-counter pain medicine and prescription medicine you can start taking again. It is especially important to talk with your health care provider if you take blood-thinning medicine.   Do not drive or use heavy machinery while taking prescription pain medicine.   For at least 3 days after your procedure, or as long as told by your health care provider, avoid:  ? Douching.  ? Using tampons.  ? Having sexual intercourse.   Continue to use birth control (contraception).   Limit your physical activity for the first day after the procedure as told by your health care provider. Ask your health care provider what activities are safe for you.   It is up to you to get the results of your procedure. Ask your health care provider, or the department performing the procedure, when your results will be ready.   Keep all follow-up visits as told by your health care provider.  This is important.  Contact a health care provider if:   You develop a skin rash.  Get help right away if:   You are bleeding heavily from your vagina or you are passing blood clots. This includes using more than one sanitary pad per hour for 2 hours in a row.   You have a fever or chills.   You have pelvic pain.   You have abnormal, yellow-colored, or bad-smelling vaginal discharge. This could be a sign of infection.   You have severe pain or cramps in your lower abdomen that do not get better with medicine.   You feel light-headed or dizzy, or you faint.  Summary   If you had a colposcopy without a biopsy, you can expect to feel fine right away, but you may have some spotting for a few days. You can go back to your normal activities.   If you had a colposcopy with a biopsy, you may notice mild pain and spotting for 48 hours after the procedure.   Avoid douching, using tampons, and having sexual intercourse for 3 days after the procedure or as long as told by your health care provider.   Contact your health care provider if you have bleeding, severe pain, or signs of infection.  This information is not intended to replace advice given to you by your health care provider. Make sure you discuss any questions you have with your   health care provider.  Document Released: 10/08/2012 Document Revised: 08/05/2015 Document Reviewed: 08/05/2015  [employer]vier Interactive Patient Education  2019 [employer]

## 2018-05-29 NOTE — Progress Notes (Signed)
HPI:  Kristen Mcpherson is a 38 y.o.  [id number]  who presents today for evaluation and management of abnormal cervical cytology.   Also, recent IUD placement for contraception.  No complaints.   She has lost 2 lbs this past month, taking Phentermine and paying attention to diet and exercise routines.  Dysplasia History:  HGSIL during pregnancy, then LGSIL (w some high grade cells suggested) on PP PAP  ROS:  Pertinent items are noted in HPI.  OB History  Gravida Para Term Preterm AB Living  5 5 4 1   6   SAB TAB Ectopic Multiple Live Births        1 6    # Outcome Date GA Lbr Len/2nd Weight Sex Delivery Anes PTL Lv  5 Term 03/20/18 [redacted]w[redacted]d  9 lb 14.7 oz (4.5 kg) F CS-LTranv Spinal  LIV  4A Preterm 10/23/11 [redacted]w[redacted]d  5 lb 4 oz (2.381 kg) F Vag-Spont   LIV  4B Preterm 10/23/11 [redacted]w[redacted]d  6 lb 9 oz (2.977 kg) M Vag-Spont   LIV     Complications: Heart abnormality  3 Term 10/09/05 [redacted]w[redacted]d  6 lb 7 oz (2.92 kg) M Vag-Spont   LIV  2 Term 09/20/03 [redacted]w[redacted]d  6 lb 9 oz (2.977 kg) M Vag-Spont   LIV  1 Term 07/04/99 [redacted]w[redacted]d  7 lb 14 oz (3.572 kg) M Vag-Spont   LIV    Past Medical History:  Diagnosis Date  . Anemia   . Anxiety   . Asthma   . Iron deficiency anemia 01/08/2018    Past Surgical History:  Procedure Laterality Date  . APPENDECTOMY    . CESAREAN SECTION N/A 03/20/2018   Procedure: CESAREAN SECTION;  Surgeon: Nadara Mustard, MD;  Location: [facility] ORS;  Service: Obstetrics;  Laterality: N/A;  . TUBAL LIGATION Bilateral 03/20/2018   Procedure: BILATERAL TUBAL LIGATION;  Surgeon: Nadara Mustard, MD;  Location: [facility] ORS;  Service: Obstetrics;  Laterality: Bilateral;    SOCIAL HISTORY: Social History   Substance and Sexual Activity  Alcohol Use Not Currently   Social History   Substance and Sexual Activity  Drug Use Never     Family History  Problem Relation Age of Onset  . Diabetes Mother   . Arthritis Mother   . Depression Mother   . Anxiety disorder Mother   . Anemia Mother   .  Hyperlipidemia Father   . Depression Father   . Hypertension Father   . ADD / ADHD Brother     ALLERGIES:  Patient has no known allergies.  Current Outpatient Medications on File Prior to Visit  Medication Sig Dispense Refill  . Cholecalciferol (VITAMIN D3) 1.25 MG (50000 UT) CAPS Take by mouth.    . ferrous sulfate 325 (65 FE) MG tablet TAKE 1 TABLET BY MOUTH TWICE A DAY 60 tablet 1  . phentermine (ADIPEX-P) 37.5 MG tablet One tablet po in morning. 30 tablet 0  . thiamine (VITAMIN B-1) 100 MG tablet Take 100 mg by mouth daily.    . valACYclovir (VALTREX) 500 MG tablet Take 1 tablet (500 mg total) by mouth 2 (two) times daily. 60 tablet 6  . vitamin B-12 (CYANOCOBALAMIN) 1000 MCG tablet Take 1,000 mcg by mouth daily.     No current facility-administered medications on file prior to visit.     Physical Exam: -Vitals:  BP 100/70   Ht 5\' 6"  (1.676 m)   Wt 191 lb (86.6 kg)   LMP 05/29/2018   BMI 30.83 kg/m  GEN: WD, WN, NAD.  A+ O x 3, good mood and affect. ABD:  NT, ND.  Soft, no masses.  No hernias noted.   Pelvic:   Vulva: Normal appearance.  No lesions.  Vagina: No lesions or abnormalities noted.  Support: Normal pelvic support.  Urethra No masses tenderness or scarring.  Meatus Normal size without lesions or prolapse.  Cervix: See below.  Anus: Normal exam.  No lesions.  Perineum: Normal exam.  No lesions.        Bimanual   Uterus: Normal size.  Non-tender.  Mobile.  AV.  Adnexae: No masses.  Non-tender to palpation.  Cul-de-sac: Negative for abnormality.   PROCEDURE: 1.  Urine Pregnancy Test:  not done 2.  Colposcopy performed with 4% acetic acid after verbal consent obtained                                         -Aceto-white Lesions Location(s): 11-1 o'clock.              -Biopsy performed at 12, 6 o'clock               -ECC indicated and performed: Yes.       -Biopsy sites made hemostatic with pressure, AgNO3, and/or Monsel's solution   -Satisfactory  colposcopy: Yes.      -Evidence of Invasive cervical CA :  NO  ASSESSMENT:  Kristen Mcpherson is a 38 y.o. [id number] here for  1. LGSIL on Pap smear of cervix   Prior HGSIL. Has had cervical dysplasia most of the time these past 6 years Plan tx if abnormal today, Cryo vs LEEP based on rssults  PLAN: 1.  I discussed the grading system of pap smears and HPV high risk viral types.  We will discuss and base management after colpo results return. 2. Follow up PAP 6 months, vs intervention if high grade dysplasia identified 3. IUD proper orientation Strings seen 2 cm Cont for contraception 4. Weight loss on Phentermine, add Topamax for added success     Annamarie MajorPaul Demaria Deeney, MD, Merlinda FrederickFACOG [facility], [facility] 05/29/2018  11:13 AM

## 2018-06-02 ENCOUNTER — Telehealth: Payer: Self-pay | Admitting: Obstetrics & Gynecology

## 2018-06-02 NOTE — Progress Notes (Signed)
Surgery Booking Request Patient Full Name:  Kristen Mcpherson  MRN: [id number]  DOB: 1980-08-11  Surgeon: Letitia Libra, MD  Requested Surgery Date and Time: June 2020 Primary Diagnosis AND Code: CIN III Secondary Diagnosis and Code:  Surgical Procedure: Cold Knife Conization, Have LEEP supplies available too L&D Notification: No Admission Status: same day surgery Length of Surgery: 20 min Special Case Needs: no H&P: yes (date) Phone Interview???: yes Interpreter: Language:  Medical Clearance: no Special Scheduling Instructions: no

## 2018-06-02 NOTE — Progress Notes (Signed)
HGSIL PAP CIN II-III on ECC and 12 o'clock biopsies Rec LEEP vs CKC Pt has IUD, rec CKC in OR where can protect strings  LM  Annamarie Major, MD, Merlinda Frederick Ob/Gyn, [facility] 06/02/2018  2:20 PM

## 2018-06-02 NOTE — Telephone Encounter (Signed)
Patient is returning missed call from [facility]. Please advise  °

## 2018-06-03 NOTE — Telephone Encounter (Signed)
Patient is aware of H&P at [facility] on 06/05/18 @ 4:30pm w/ Dr. Tiburcio Pea, Pre-admit Testing phone interview and COVID testing to be scheduled, and OR on 06/12/18. Patient is aware she will be asked to quarantine after COVID testing.

## 2018-06-05 ENCOUNTER — Encounter: Payer: Self-pay | Admitting: Obstetrics & Gynecology

## 2018-06-05 ENCOUNTER — Ambulatory Visit (INDEPENDENT_AMBULATORY_CARE_PROVIDER_SITE_OTHER): Payer: Medicaid Other | Admitting: Obstetrics & Gynecology

## 2018-06-05 ENCOUNTER — Other Ambulatory Visit: Payer: Self-pay

## 2018-06-05 VITALS — BP 120/70 | Ht 66.0 in | Wt 189.0 lb

## 2018-06-05 DIAGNOSIS — D069 Carcinoma in situ of cervix, unspecified: Secondary | ICD-10-CM | POA: Diagnosis not present

## 2018-06-05 NOTE — Patient Instructions (Signed)
Cervical Conization, Care After  This sheet gives you information about how to care for yourself after your procedure. Your doctor may also give you more specific instructions. If you have problems or questions, contact your doctor.  Follow these instructions at home:  Medicines     Take over-the-counter and prescription medicines only as told by your doctor.   Do not take aspirin until your doctor says it is okay.   If you take pain medicine:  ? You may have constipation. To help treat this, your doctor may tell you to:   Drink enough fluid to keep your pee (urine) clear or pale yellow.   Take medicines.   Eat foods that are high in fiber. These include fresh fruits and vegetables, whole grains, bran, and beans.   Limit foods that are high in fat and sugar. These include fried foods and sweet foods.  ? Do not drive or use heavy machines.  General instructions   You can eat your usual diet unless your doctor tells you not to do so.   Take showers for the first week. Do not take baths, swim, or use hot tubs until your doctor says it is okay.   Do not douche, use tampons, or have sex until your doctor says it is okay.   For 7-14 days after your procedure, avoid:  ? Being very active.  ? Exercising.  ? Heavy lifting.   Keep all follow-up visits as told by your doctor. This is important.  Contact a doctor if:   You have a rash.   You are dizzy or lightheaded.   You feel sick to your stomach (nauseous).   You throw up (vomit).   You have fluid from your vagina (vaginal discharge) that smells bad.  Get help right away if:   There are blood clots coming from your vagina.   You have more bleeding than you would have in a normal period. For example, you soak a pad in less than 1 hour.   You have a fever.   You have more and more cramps.   You pass out (faint).   You have pain when peeing.   Your have a lot of pain.   Your pain gets worse.   Your pain does not get better when you take your  medicine.   You have blood in your pee.   You throw up (vomit).  Summary   After your procedure, take over-the-counter and prescription medicines only as told by your doctor.   Do not douche, use tampons, or have sex until your doctor says it is okay.   For about 7-14 days after your procedure, try not to exercise or lift heavy objects.   Get help right away if you have new symptoms, or if your symptoms become worse.  This information is not intended to replace advice given to you by your health care provider. Make sure you discuss any questions you have with your health care provider.  Document Released: 09/27/2007 Document Revised: 12/21/2015 Document Reviewed: 12/21/2015  Elsevier Interactive Patient Education  2019 Elsevier Inc[employer]

## 2018-06-05 NOTE — Progress Notes (Signed)
PRE-OPERATIVE HISTORY AND PHYSICAL EXAM  HPI:  Kristen Mcpherson is a 38 y.o. [id number] Patient's last menstrual period was 05/29/2018.; she is being admitted for surgery related to cervical dysplasia. HGSIL PAP CIN II-III on ECC and 12 o'clock biopsies  PMHx: Past Medical History:  Diagnosis Date  . Anemia   . Anxiety   . Asthma   . Iron deficiency anemia 01/08/2018   Past Surgical History:  Procedure Laterality Date  . APPENDECTOMY    . CESAREAN SECTION N/A 03/20/2018   Procedure: CESAREAN SECTION;  Surgeon: Nadara Mustard, MD;  Location: [facility] ORS;  Service: Obstetrics;  Laterality: N/A;  . TUBAL LIGATION Bilateral 03/20/2018   Procedure: BILATERAL TUBAL LIGATION;  Surgeon: Nadara Mustard, MD;  Location: [facility] ORS;  Service: Obstetrics;  Laterality: Bilateral;   Family History  Problem Relation Age of Onset  . Diabetes Mother   . Arthritis Mother   . Depression Mother   . Anxiety disorder Mother   . Anemia Mother   . Hyperlipidemia Father   . Depression Father   . Hypertension Father   . ADD / ADHD Brother    Social History   Tobacco Use  . Smoking status: Former Smoker    Packs/day: 0.50    Years: 15.00    Pack years: 7.50    Last attempt to quit: 01/08/2017    Years since quitting: 1.4  . Smokeless tobacco: Never Used  Substance Use Topics  . Alcohol use: Not Currently  . Drug use: Never    Current Outpatient Medications:  .  ferrous sulfate 325 (65 FE) MG tablet, TAKE 1 TABLET BY MOUTH TWICE A DAY (Patient not taking: Reported on 06/04/2018), Disp: 60 tablet, Rfl: 1 .  ibuprofen (ADVIL) 200 MG tablet, Take 200 mg by mouth every 6 (six) hours as needed., Disp: , Rfl:  .  levonorgestrel (MIRENA) 20 MCG/24HR IUD, 1 each by Intrauterine route once., Disp: , Rfl:  .  phentermine (ADIPEX-P) 37.5 MG tablet, One tablet po in morning. (Patient taking differently: Take 37.5 mg by mouth daily before breakfast. ), Disp: 30 tablet, Rfl: 0 .  topiramate (TOPAMAX) 25 MG  tablet, Take 1 tablet (25 mg total) by mouth 2 (two) times daily., Disp: 60 tablet, Rfl: 4 .  valACYclovir (VALTREX) 500 MG tablet, Take 1 tablet (500 mg total) by mouth 2 (two) times daily. (Patient not taking: Reported on 06/04/2018), Disp: 60 tablet, Rfl: 6 Allergies: Patient has no known allergies.  Review of Systems  Constitutional: Negative for chills, fever and malaise/fatigue.  HENT: Negative for congestion, sinus pain and sore throat.   Eyes: Negative for blurred vision and pain.  Respiratory: Negative for cough and wheezing.   Cardiovascular: Negative for chest pain and leg swelling.  Gastrointestinal: Negative for abdominal pain, constipation, diarrhea, heartburn, nausea and vomiting.  Genitourinary: Negative for dysuria, frequency, hematuria and urgency.  Musculoskeletal: Negative for back pain, joint pain, myalgias and neck pain.  Skin: Negative for itching and rash.  Neurological: Negative for dizziness, tremors and weakness.  Endo/Heme/Allergies: Does not bruise/bleed easily.  Psychiatric/Behavioral: Negative for depression. The patient is not nervous/anxious and does not have insomnia.   All other systems reviewed and are negative.   Objective: LMP 05/29/2018  There were no vitals filed for this visit. Physical Exam Constitutional:      General: She is not in acute distress.    Appearance: She is well-developed.  HENT:     Head: Normocephalic and atraumatic. No  laceration.     Right Ear: Hearing normal.     Left Ear: Hearing normal.     Mouth/Throat:     Pharynx: Uvula midline.  Eyes:     Pupils: Pupils are equal, round, and reactive to light.  Neck:     Musculoskeletal: Normal range of motion and neck supple.     Thyroid: No thyromegaly.  Cardiovascular:     Rate and Rhythm: Normal rate and regular rhythm.     Heart sounds: No murmur. No friction rub. No gallop.   Pulmonary:     Effort: Pulmonary effort is normal. No respiratory distress.     Breath sounds:  Normal breath sounds. No wheezing.  Chest:     Breasts:        Right: No mass, skin change or tenderness.        Left: No mass, skin change or tenderness.  Abdominal:     General: Bowel sounds are normal. There is no distension.     Palpations: Abdomen is soft.     Tenderness: There is no abdominal tenderness. There is no rebound.  Musculoskeletal: Normal range of motion.  Neurological:     Mental Status: She is alert and oriented to person, place, and time.     Cranial Nerves: No cranial nerve deficit.  Skin:    General: Skin is warm and dry.  Psychiatric:        Judgment: Judgment normal.  Vitals signs reviewed.     Assessment:1. CIN III (cervical intraepithelial neoplasia grade III) with severe dysplasia Plan LEEP/ CKC of cervix Protect IUD strings as possible  I have had a careful discussion with this patient about all the options available and the risk/benefits of each. I have fully informed this patient that surgery may subject her to a variety of discomforts and risks: She understands that most patients have surgery with little difficulty, but problems can happen ranging from minor to fatal. These include nausea, vomiting, pain, bleeding, infection, poor healing, hernia, or formation of adhesions. Unexpected reactions may occur from any drug or anesthetic given. Unintended injury may occur to other pelvic or abdominal structures such as bladder, ureter (tube from kidney to bladder). Nerves going from the pelvis to the legs may be injured. Any such injury may require immediate or later additional surgery to correct the problem. Excessive blood loss requiring transfusion is very unlikely but possible. Dangerous blood clots may form in the legs or lungs. Physical and sexual activity will be restricted in varying degrees for an indeterminate period of time but most often 2-6 weeks.  Finally, she understands that it is impossible to list every possible undesirable effect and that the  condition for which surgery is done is not always cured or significantly improved, and in rare cases may be even worse.Ample time was given to answer all questions.  Paul Thi Sisemore, MD, FACOG [facility], [facility] 06/05/2018  4:03 PM 

## 2018-06-05 NOTE — H&P (View-Only) (Signed)
PRE-OPERATIVE HISTORY AND PHYSICAL EXAM  HPI:  Kristen Mcpherson is a 38 y.o. [id number] Patient's last menstrual period was 05/29/2018.; she is being admitted for surgery related to cervical dysplasia. HGSIL PAP CIN II-III on ECC and 12 o'clock biopsies  PMHx: Past Medical History:  Diagnosis Date  . Anemia   . Anxiety   . Asthma   . Iron deficiency anemia 01/08/2018   Past Surgical History:  Procedure Laterality Date  . APPENDECTOMY    . CESAREAN SECTION N/A 03/20/2018   Procedure: CESAREAN SECTION;  Surgeon: Nadara Mustard, MD;  Location: [facility] ORS;  Service: Obstetrics;  Laterality: N/A;  . TUBAL LIGATION Bilateral 03/20/2018   Procedure: BILATERAL TUBAL LIGATION;  Surgeon: Nadara Mustard, MD;  Location: [facility] ORS;  Service: Obstetrics;  Laterality: Bilateral;   Family History  Problem Relation Age of Onset  . Diabetes Mother   . Arthritis Mother   . Depression Mother   . Anxiety disorder Mother   . Anemia Mother   . Hyperlipidemia Father   . Depression Father   . Hypertension Father   . ADD / ADHD Brother    Social History   Tobacco Use  . Smoking status: Former Smoker    Packs/day: 0.50    Years: 15.00    Pack years: 7.50    Last attempt to quit: 01/08/2017    Years since quitting: 1.4  . Smokeless tobacco: Never Used  Substance Use Topics  . Alcohol use: Not Currently  . Drug use: Never    Current Outpatient Medications:  .  ferrous sulfate 325 (65 FE) MG tablet, TAKE 1 TABLET BY MOUTH TWICE A DAY (Patient not taking: Reported on 06/04/2018), Disp: 60 tablet, Rfl: 1 .  ibuprofen (ADVIL) 200 MG tablet, Take 200 mg by mouth every 6 (six) hours as needed., Disp: , Rfl:  .  levonorgestrel (MIRENA) 20 MCG/24HR IUD, 1 each by Intrauterine route once., Disp: , Rfl:  .  phentermine (ADIPEX-P) 37.5 MG tablet, One tablet po in morning. (Patient taking differently: Take 37.5 mg by mouth daily before breakfast. ), Disp: 30 tablet, Rfl: 0 .  topiramate (TOPAMAX) 25 MG  tablet, Take 1 tablet (25 mg total) by mouth 2 (two) times daily., Disp: 60 tablet, Rfl: 4 .  valACYclovir (VALTREX) 500 MG tablet, Take 1 tablet (500 mg total) by mouth 2 (two) times daily. (Patient not taking: Reported on 06/04/2018), Disp: 60 tablet, Rfl: 6 Allergies: Patient has no known allergies.  Review of Systems  Constitutional: Negative for chills, fever and malaise/fatigue.  HENT: Negative for congestion, sinus pain and sore throat.   Eyes: Negative for blurred vision and pain.  Respiratory: Negative for cough and wheezing.   Cardiovascular: Negative for chest pain and leg swelling.  Gastrointestinal: Negative for abdominal pain, constipation, diarrhea, heartburn, nausea and vomiting.  Genitourinary: Negative for dysuria, frequency, hematuria and urgency.  Musculoskeletal: Negative for back pain, joint pain, myalgias and neck pain.  Skin: Negative for itching and rash.  Neurological: Negative for dizziness, tremors and weakness.  Endo/Heme/Allergies: Does not bruise/bleed easily.  Psychiatric/Behavioral: Negative for depression. The patient is not nervous/anxious and does not have insomnia.   All other systems reviewed and are negative.   Objective: LMP 05/29/2018  There were no vitals filed for this visit. Physical Exam Constitutional:      General: She is not in acute distress.    Appearance: She is well-developed.  HENT:     Head: Normocephalic and atraumatic. No  laceration.     Right Ear: Hearing normal.     Left Ear: Hearing normal.     Mouth/Throat:     Pharynx: Uvula midline.  Eyes:     Pupils: Pupils are equal, round, and reactive to light.  Neck:     Musculoskeletal: Normal range of motion and neck supple.     Thyroid: No thyromegaly.  Cardiovascular:     Rate and Rhythm: Normal rate and regular rhythm.     Heart sounds: No murmur. No friction rub. No gallop.   Pulmonary:     Effort: Pulmonary effort is normal. No respiratory distress.     Breath sounds:  Normal breath sounds. No wheezing.  Chest:     Breasts:        Right: No mass, skin change or tenderness.        Left: No mass, skin change or tenderness.  Abdominal:     General: Bowel sounds are normal. There is no distension.     Palpations: Abdomen is soft.     Tenderness: There is no abdominal tenderness. There is no rebound.  Musculoskeletal: Normal range of motion.  Neurological:     Mental Status: She is alert and oriented to person, place, and time.     Cranial Nerves: No cranial nerve deficit.  Skin:    General: Skin is warm and dry.  Psychiatric:        Judgment: Judgment normal.  Vitals signs reviewed.     Assessment:1. CIN III (cervical intraepithelial neoplasia grade III) with severe dysplasia Plan LEEP/ CKC of cervix Protect IUD strings as possible  I have had a careful discussion with this patient about all the options available and the risk/benefits of each. I have fully informed this patient that surgery may subject her to a variety of discomforts and risks: She understands that most patients have surgery with little difficulty, but problems can happen ranging from minor to fatal. These include nausea, vomiting, pain, bleeding, infection, poor healing, hernia, or formation of adhesions. Unexpected reactions may occur from any drug or anesthetic given. Unintended injury may occur to other pelvic or abdominal structures such as bladder, ureter (tube from kidney to bladder). Nerves going from the pelvis to the legs may be injured. Any such injury may require immediate or later additional surgery to correct the problem. Excessive blood loss requiring transfusion is very unlikely but possible. Dangerous blood clots may form in the legs or lungs. Physical and sexual activity will be restricted in varying degrees for an indeterminate period of time but most often 2-6 weeks.  Finally, she understands that it is impossible to list every possible undesirable effect and that the  condition for which surgery is done is not always cured or significantly improved, and in rare cases may be even worse.Ample time was given to answer all questions.  Annamarie MajorPaul Maksymilian Mabey, MD, Merlinda FrederickFACOG [facility], [facility] 06/05/2018  4:03 PM

## 2018-06-06 ENCOUNTER — Encounter
Admission: RE | Admit: 2018-06-06 | Discharge: 2018-06-06 | Disposition: A | Discharge status: Home / Self Care | Payer: Medicaid Other | Source: Ambulatory Visit | Attending: Obstetrics & Gynecology | Admitting: Obstetrics & Gynecology

## 2018-06-06 ENCOUNTER — Other Ambulatory Visit: Payer: Self-pay

## 2018-06-06 NOTE — Patient Instructions (Signed)
Your procedure is scheduled on: 06/12/18 Report to Day Surgery. [facility]OND FLOOR To find out your arrival time please call [phone] between 1PM - 3PM on 06/11/18.  Remember: Instructions that are not followed completely may result in serious medical risk,  up to and including death, or upon the discretion of your surgeon and anesthesiologist your  surgery may need to be rescheduled.     _X__ 1. Do not eat food after midnight the night before your procedure.                 No gum chewing or hard candies. You may drink clear liquids up to 2 hours                 before you are scheduled to arrive for your surgery- DO not drink clear                 liquids within 2 hours of the start of your surgery.                 Clear Liquids include:  water, apple juice without pulp, clear carbohydrate                 drink such as Clearfast of Gatorade, Black Coffee or Tea (Do not add                 anything to coffee or tea).  __X__2.  On the morning of surgery brush your teeth with toothpaste and water, you                may rinse your mouth with mouthwash if you wish.  Do not swallow any toothpaste of mouthwash.     _X__ 3.  No Alcohol for 24 hours before or after surgery.   _X__ 4.  Do Not Smoke or use e-cigarettes For 24 Hours Prior to Your Surgery.                 Do not use any chewable tobacco products for at least 6 hours prior to                 surgery.  ____  5.  Bring all medications with you on the day of surgery if instructed.   ____  6.  Notify your doctor if there is any change in your medical condition      (cold, fever, infections).     Do not wear jewelry, make-up, hairpins, clips or nail polish. Do not wear lotions, powders, or perfumes. You may wear deodorant. Do not shave 48 hours prior to surgery. Men may shave face and neck. Do not bring valuables to the hospital.    [facility] is not responsible for any belongings or  valuables.  Contacts, dentures or bridgework may not be worn into surgery. Leave your suitcase in the car. After surgery it may be brought to your room. For patients admitted to the hospital, discharge time is determined by your treatment team.   Patients discharged the day of surgery will not be allowed to drive home.   Please read over the following fact sheets that you were given:   SPIROMETRY          X__ Take these medicines the morning of surgery with A SIP OF WATER:    1. TOPIRAMAX  2.   3.   4.  5.  6.  ____ Fleet Enema (as directed)   ____ Use CHG  Soap as directed  ____ Use inhalers on the day of surgery  ____ Stop metformin 2 days prior to surgery    ____ Take 1/2 of usual insulin dose the night before surgery. No insulin the morning          of surgery.   ____ Stop Coumadin/Plavix/aspirin on   ____ Stop Anti-inflammatories on   ____ Stop supplements until after surgery.    ____ Bring C-Pap to the hospital.    STOP PHENTERMINE 06/06/18  BRING SPIROMETRY DAY OF SURGERY

## 2018-06-09 ENCOUNTER — Encounter
Admission: RE | Admit: 2018-06-09 | Discharge: 2018-06-09 | Disposition: A | Discharge status: Home / Self Care | Payer: Medicaid Other | Source: Ambulatory Visit | Attending: Obstetrics & Gynecology | Admitting: Obstetrics & Gynecology

## 2018-06-09 ENCOUNTER — Other Ambulatory Visit: Payer: Self-pay

## 2018-06-09 DIAGNOSIS — Z1159 Encounter for screening for other viral diseases: Secondary | ICD-10-CM | POA: Diagnosis not present

## 2018-06-09 DIAGNOSIS — Z01812 Encounter for preprocedural laboratory examination: Secondary | ICD-10-CM | POA: Insufficient documentation

## 2018-06-10 LAB — NOVEL CORONAVIRUS, NAA (HOSP ORDER, SEND-OUT TO REF LAB; TAT 18-24 HRS): SARS-CoV-2, NAA: NOT DETECTED

## 2018-06-11 ENCOUNTER — Other Ambulatory Visit: Payer: Self-pay

## 2018-06-11 ENCOUNTER — Encounter: Payer: Self-pay | Admitting: Anesthesiology

## 2018-06-11 ENCOUNTER — Inpatient Hospital Stay: Payer: Medicaid Other | Attending: Oncology

## 2018-06-11 DIAGNOSIS — D519 Vitamin B12 deficiency anemia, unspecified: Secondary | ICD-10-CM | POA: Diagnosis not present

## 2018-06-11 DIAGNOSIS — D508 Other iron deficiency anemias: Secondary | ICD-10-CM

## 2018-06-11 MED ORDER — CYANOCOBALAMIN 1000 MCG/ML IJ SOLN
1000.0000 ug | Freq: Once | INTRAMUSCULAR | Status: AC
  Administered 2018-06-11: 1000 ug via INTRAMUSCULAR

## 2018-06-12 ENCOUNTER — Encounter: Payer: Self-pay | Admitting: Certified Registered Nurse Anesthetist

## 2018-06-12 ENCOUNTER — Other Ambulatory Visit: Payer: Self-pay

## 2018-06-12 ENCOUNTER — Ambulatory Visit: Payer: Medicaid Other | Admitting: Anesthesiology

## 2018-06-12 ENCOUNTER — Encounter
Admission: RE | Disposition: A | Discharge status: Home / Self Care | Payer: Self-pay | Source: Home / Self Care | Attending: Obstetrics & Gynecology

## 2018-06-12 ENCOUNTER — Ambulatory Visit
Admission: RE | Admit: 2018-06-12 | Discharge: 2018-06-12 | Disposition: A | Discharge status: Home / Self Care | Payer: Medicaid Other | Attending: Obstetrics & Gynecology | Admitting: Obstetrics & Gynecology

## 2018-06-12 DIAGNOSIS — Z818 Family history of other mental and behavioral disorders: Secondary | ICD-10-CM | POA: Insufficient documentation

## 2018-06-12 DIAGNOSIS — J45909 Unspecified asthma, uncomplicated: Secondary | ICD-10-CM | POA: Insufficient documentation

## 2018-06-12 DIAGNOSIS — D069 Carcinoma in situ of cervix, unspecified: Secondary | ICD-10-CM | POA: Diagnosis not present

## 2018-06-12 DIAGNOSIS — Z8249 Family history of ischemic heart disease and other diseases of the circulatory system: Secondary | ICD-10-CM | POA: Diagnosis not present

## 2018-06-12 DIAGNOSIS — D509 Iron deficiency anemia, unspecified: Secondary | ICD-10-CM | POA: Insufficient documentation

## 2018-06-12 DIAGNOSIS — Z793 Long term (current) use of hormonal contraceptives: Secondary | ICD-10-CM | POA: Diagnosis not present

## 2018-06-12 DIAGNOSIS — F419 Anxiety disorder, unspecified: Secondary | ICD-10-CM | POA: Diagnosis not present

## 2018-06-12 DIAGNOSIS — Z79899 Other long term (current) drug therapy: Secondary | ICD-10-CM | POA: Diagnosis not present

## 2018-06-12 DIAGNOSIS — F329 Major depressive disorder, single episode, unspecified: Secondary | ICD-10-CM | POA: Insufficient documentation

## 2018-06-12 DIAGNOSIS — Z833 Family history of diabetes mellitus: Secondary | ICD-10-CM | POA: Insufficient documentation

## 2018-06-12 DIAGNOSIS — Z791 Long term (current) use of non-steroidal anti-inflammatories (NSAID): Secondary | ICD-10-CM | POA: Insufficient documentation

## 2018-06-12 DIAGNOSIS — Z8261 Family history of arthritis: Secondary | ICD-10-CM | POA: Insufficient documentation

## 2018-06-12 HISTORY — PX: CERVICAL CONIZATION W/BX: SHX1330

## 2018-06-12 LAB — POCT PREGNANCY, URINE: Preg Test, Ur: NEGATIVE

## 2018-06-12 LAB — CBC
HCT: 37.2 % (ref 36.0–46.0)
Hemoglobin: 12.2 g/dL (ref 12.0–15.0)
MCH: 31.4 pg (ref 26.0–34.0)
MCHC: 32.8 g/dL (ref 30.0–36.0)
MCV: 95.9 fL (ref 80.0–100.0)
Platelets: 255 10*3/uL (ref 150–400)
RBC: 3.88 MIL/uL (ref 3.87–5.11)
RDW: 13.6 % (ref 11.5–15.5)
WBC: 6.1 10*3/uL (ref 4.0–10.5)
nRBC: 0 % (ref 0.0–0.2)

## 2018-06-12 LAB — TYPE AND SCREEN
ABO/RH(D): A POS
Antibody Screen: NEGATIVE

## 2018-06-12 SURGERY — CONE BIOPSY, CERVIX
Anesthesia: General

## 2018-06-12 MED ORDER — OXYCODONE-ACETAMINOPHEN 5-325 MG PO TABS: 1.0000 | ORAL_TABLET | ORAL | 0 refills | Status: DC | PRN

## 2018-06-12 MED ORDER — PROPOFOL 10 MG/ML IV BOLUS
INTRAVENOUS | Status: DC | PRN
  Administered 2018-06-12: 150 mg via INTRAVENOUS

## 2018-06-12 MED ORDER — ACETAMINOPHEN 325 MG PO TABS: 650.0000 mg | ORAL_TABLET | ORAL | Status: DC | PRN

## 2018-06-12 MED ORDER — FENTANYL CITRATE (PF) 100 MCG/2ML IJ SOLN
INTRAMUSCULAR | Status: AC
  Filled 2018-06-12: qty 2

## 2018-06-12 MED ORDER — GLYCOPYRROLATE 0.2 MG/ML IJ SOLN
INTRAMUSCULAR | Status: AC
  Filled 2018-06-12: qty 1

## 2018-06-12 MED ORDER — LACTATED RINGERS IV SOLN: INTRAVENOUS | Status: DC

## 2018-06-12 MED ORDER — KETOROLAC TROMETHAMINE 30 MG/ML IJ SOLN
30.0000 mg | Freq: Four times a day (QID) | INTRAMUSCULAR | Status: DC
  Administered 2018-06-12: 10:00:00 30 mg via INTRAVENOUS
  Filled 2018-06-12: qty 1

## 2018-06-12 MED ORDER — ONDANSETRON HCL 4 MG/2ML IJ SOLN
4.0000 mg | Freq: Once | INTRAMUSCULAR | Status: AC | PRN
  Administered 2018-06-12: 4 mg via INTRAVENOUS

## 2018-06-12 MED ORDER — LIDOCAINE HCL (CARDIAC) PF 100 MG/5ML IV SOSY
PREFILLED_SYRINGE | INTRAVENOUS | Status: DC | PRN
  Administered 2018-06-12: 100 mg via INTRAVENOUS

## 2018-06-12 MED ORDER — GLYCOPYRROLATE 0.2 MG/ML IJ SOLN
INTRAMUSCULAR | Status: DC | PRN
  Administered 2018-06-12: 0.2 mg via INTRAVENOUS

## 2018-06-12 MED ORDER — FERRIC SUBSULFATE 259 MG/GM EX SOLN
CUTANEOUS | Status: AC
  Filled 2018-06-12: qty 8

## 2018-06-12 MED ORDER — LIDOCAINE HCL (PF) 2 % IJ SOLN
INTRAMUSCULAR | Status: AC
  Filled 2018-06-12: qty 10

## 2018-06-12 MED ORDER — PROPOFOL 10 MG/ML IV BOLUS
INTRAVENOUS | Status: AC
  Filled 2018-06-12: qty 20

## 2018-06-12 MED ORDER — ONDANSETRON HCL 4 MG/2ML IJ SOLN
INTRAMUSCULAR | Status: AC
  Filled 2018-06-12: qty 2

## 2018-06-12 MED ORDER — FENTANYL CITRATE (PF) 100 MCG/2ML IJ SOLN
INTRAMUSCULAR | Status: DC | PRN
  Administered 2018-06-12 (×4): 25 ug via INTRAVENOUS
  Administered 2018-06-12: 50 ug via INTRAVENOUS
  Administered 2018-06-12 (×2): 25 ug via INTRAVENOUS

## 2018-06-12 MED ORDER — KETOROLAC TROMETHAMINE 30 MG/ML IJ SOLN
INTRAMUSCULAR | Status: AC
  Filled 2018-06-12: qty 1

## 2018-06-12 MED ORDER — MORPHINE SULFATE (PF) 4 MG/ML IV SOLN: 1.0000 mg | INTRAVENOUS | Status: DC | PRN

## 2018-06-12 MED ORDER — FERRIC SUBSULFATE 259 MG/GM EX SOLN
CUTANEOUS | Status: DC | PRN
  Administered 2018-06-12: 1 via TOPICAL

## 2018-06-12 MED ORDER — FAMOTIDINE 20 MG PO TABS: 20.0000 mg | ORAL_TABLET | Freq: Once | ORAL | Status: DC

## 2018-06-12 MED ORDER — ACETAMINOPHEN NICU IV SYRINGE 10 MG/ML
INTRAVENOUS | Status: AC
  Filled 2018-06-12: qty 1

## 2018-06-12 MED ORDER — DEXAMETHASONE SODIUM PHOSPHATE 10 MG/ML IJ SOLN
INTRAMUSCULAR | Status: DC | PRN
  Administered 2018-06-12: 4 mg via INTRAVENOUS

## 2018-06-12 MED ORDER — LACTATED RINGERS IV SOLN
INTRAVENOUS | Status: DC
  Administered 2018-06-12: 09:00:00 via INTRAVENOUS

## 2018-06-12 MED ORDER — FENTANYL CITRATE (PF) 100 MCG/2ML IJ SOLN
25.0000 ug | INTRAMUSCULAR | Status: DC | PRN
  Administered 2018-06-12 (×4): 25 ug via INTRAVENOUS

## 2018-06-12 MED ORDER — ONDANSETRON HCL 4 MG/2ML IJ SOLN
INTRAMUSCULAR | Status: DC | PRN
  Administered 2018-06-12: 4 mg via INTRAVENOUS

## 2018-06-12 MED ORDER — MIDAZOLAM HCL 2 MG/2ML IJ SOLN
INTRAMUSCULAR | Status: AC
  Filled 2018-06-12: qty 2

## 2018-06-12 MED ORDER — LIDOCAINE-EPINEPHRINE 1 %-1:100000 IJ SOLN
INTRAMUSCULAR | Status: AC
  Filled 2018-06-12: qty 1

## 2018-06-12 MED ORDER — FENTANYL CITRATE (PF) 100 MCG/2ML IJ SOLN
INTRAMUSCULAR | Status: AC
  Administered 2018-06-12: 25 ug via INTRAVENOUS
  Filled 2018-06-12: qty 2

## 2018-06-12 MED ORDER — ACETAMINOPHEN 650 MG RE SUPP
650.0000 mg | RECTAL | Status: DC | PRN
  Filled 2018-06-12: qty 1

## 2018-06-12 MED ORDER — OXYCODONE-ACETAMINOPHEN 5-325 MG PO TABS: 1.0000 | ORAL_TABLET | ORAL | Status: DC | PRN

## 2018-06-12 MED ORDER — ACETAMINOPHEN 10 MG/ML IV SOLN
INTRAVENOUS | Status: DC | PRN
  Administered 2018-06-12: 1000 mg via INTRAVENOUS

## 2018-06-12 MED ORDER — DEXAMETHASONE SODIUM PHOSPHATE 4 MG/ML IJ SOLN
INTRAMUSCULAR | Status: AC
  Filled 2018-06-12: qty 1

## 2018-06-12 MED ORDER — LIDOCAINE-EPINEPHRINE 1 %-1:100000 IJ SOLN
INTRAMUSCULAR | Status: DC | PRN
  Administered 2018-06-12: 18 mL

## 2018-06-12 MED ORDER — MIDAZOLAM HCL 2 MG/2ML IJ SOLN
INTRAMUSCULAR | Status: DC | PRN
  Administered 2018-06-12: 2 mg via INTRAVENOUS

## 2018-06-12 MED ORDER — KETOROLAC TROMETHAMINE 30 MG/ML IJ SOLN
INTRAMUSCULAR | Status: DC | PRN
  Administered 2018-06-12: 30 mg via INTRAVENOUS

## 2018-06-12 SURGICAL SUPPLY — 39 items
APPLICATOR COTTON TIP 6IN STRL (MISCELLANEOUS) ×2 IMPLANT
APPLICATOR SWAB PROCTO LG 16IN (MISCELLANEOUS) ×4 IMPLANT
BAG COUNTER SPONGE EZ (MISCELLANEOUS) IMPLANT
BLADE SURG SZ10 CARB STEEL (BLADE) ×2 IMPLANT
CANISTER SUCT 1200ML W/VALVE (MISCELLANEOUS) ×2 IMPLANT
CATH ROBINSON RED A/P 16FR (CATHETERS) ×2 IMPLANT
CLEANER CAUTERY TIP 5X5 PAD (MISCELLANEOUS) ×1 IMPLANT
CNTNR SPEC 2.5X3XGRAD LEK (MISCELLANEOUS)
CONT SPEC 4OZ STER OR WHT (MISCELLANEOUS)
CONTAINER SPEC 2.5X3XGRAD LEK (MISCELLANEOUS) IMPLANT
COVER WAND RF STERILE (DRAPES) ×2 IMPLANT
ELECT LEEP BALL 5MM 12CM (MISCELLANEOUS) ×2
ELECT LEEP LOOP 20X10 R2010 (MISCELLANEOUS)
ELECT LOOP 1.0X1.0CM R1010 (MISCELLANEOUS)
ELECT REM PT RETURN 9FT ADLT (ELECTROSURGICAL) ×2
ELECTRODE LEEP BALL 5MM 12CM (MISCELLANEOUS) ×1 IMPLANT
ELECTRODE LEP LOOP 20X10 R2010 (MISCELLANEOUS) IMPLANT
ELECTRODE LOOP 1.0X1.0CM R1010 (MISCELLANEOUS) IMPLANT
ELECTRODE REM PT RTRN 9FT ADLT (ELECTROSURGICAL) ×1 IMPLANT
GLOVE BIO SURGEON STRL SZ8 (GLOVE) ×2 IMPLANT
GOWN STRL REUS W/ TWL LRG LVL3 (GOWN DISPOSABLE) ×1 IMPLANT
GOWN STRL REUS W/ TWL XL LVL3 (GOWN DISPOSABLE) ×1 IMPLANT
GOWN STRL REUS W/TWL LRG LVL3 (GOWN DISPOSABLE) ×1
GOWN STRL REUS W/TWL XL LVL3 (GOWN DISPOSABLE) ×1
NEEDLE SPNL 22GX3.5 QUINCKE BK (NEEDLE) ×2 IMPLANT
NS IRRIG 500ML POUR BTL (IV SOLUTION) ×2 IMPLANT
PACK DNC HYST (MISCELLANEOUS) ×2 IMPLANT
PAD CLEANER CAUTERY TIP 5X5 (MISCELLANEOUS) ×1
PAD OB MATERNITY 4.3X12.25 (PERSONAL CARE ITEMS) ×2 IMPLANT
PAD PREP 24X41 OB/GYN DISP (PERSONAL CARE ITEMS) ×2 IMPLANT
PENCIL ELECTRO HAND CTR (MISCELLANEOUS) ×2 IMPLANT
STRAP SAFETY 5IN WIDE (MISCELLANEOUS) ×2 IMPLANT
STRAW SMOKE EVAC LEEP 6150 NON (MISCELLANEOUS) ×2 IMPLANT
SUT ETHILON 3-0 FS-10 30 BLK (SUTURE) ×2
SUT VIC AB 0 CT1 36 (SUTURE) ×4 IMPLANT
SUT VIC AB 0 CT2 27 (SUTURE) ×6 IMPLANT
SUTURE EHLN 3-0 FS-10 30 BLK (SUTURE) ×1 IMPLANT
SYR CONTROL 10ML (SYRINGE) ×2 IMPLANT
TUBING CONNECTING 10 (TUBING) ×2 IMPLANT

## 2018-06-12 NOTE — Discharge Instructions (Signed)
Cervical Conization, Care After °This sheet gives you information about how to care for yourself after your procedure. Your health care provider may also give you more specific instructions. If you have problems or questions, contact your health care provider. °What can I expect after the procedure? °After the procedure, it is common to have: °· A groggy feeling, if you were given medicine to make you fall asleep (general anesthetic). °· Cramps that feel similar to menstrual cramps. °· Bloody discharge or light to moderate bleeding. °· Dark discharge. This discharge may look similar to coffee grounds. This is from the paste that was applied to the cervix to control bleeding. °Follow these instructions at home: °Medicines ° °· Take over-the-counter and prescription medicines only as told by your health care provider. °· Do not take aspirin until your health care provider says it is okay. Aspirin can cause bleeding. °· If you are taking pain medicine: °? You may need to prevent or treat constipation. To do this, your health care provider may recommend that you: °§ Drink enough fluid to keep your urine clear or pale yellow. °§ Take over-the-counter or prescription medicines. °§ Eat foods that are high in fiber, such as fresh fruits and vegetables, whole grains, bran, and beans. °§ Limit foods that are high in fat and processed sugars, such as fried and sweet foods. °? Do not drive or use heavy machinery. °General instructions °· You may resume your normal diet unless your health care provider advises you not to do so. °· Take showers for the first week. Do not take baths, swim, or use hot tubs until your health care provider says it is okay. °· Do not douche, use tampons, or have sex until your health care provider says it is okay. °· Avoid activities that require great effort, such as exercises and heavy lifting, for at least 7-14 days. °· Keep all follow-up visits as told by your health care provider. This is  important. °Contact a health care provider if: °· You develop a rash. °· You are dizzy or lightheaded. °· You feel nauseous or you vomit. °· You develop a bad smelling discharge from your vagina. °Get help right away if: °· You have blood clots or bleeding that is heavier than a normal period. Bleeding that soaks a pad in less than 1 hour is considered heavy bleeding. °· You have a fever. °· You have increasing cramps. °· You faint. °· You have pain when you urinate. °· You have severe or worsening pain. °· Your pain is not relieved when you take medicine. °· You have bloody urine. °· You vomit. °Summary °· After the procedure, it is common to have cramps and dark or bloody discharge from your vagina. °· Do not douche, use tampons, or have sex until your health care provider says it is okay. °· Follow all other activity restrictions as told by your health care provider. °This information is not intended to replace advice given to you by your health care provider. Make sure you discuss any questions you have with your health care provider. °Document Released: 12/18/2004 Document Revised: 12/21/2015 Document Reviewed: 12/21/2015 °Elsevier Interactive Patient Education © 2019 [employer] ° ° °AMBULATORY SURGERY  °DISCHARGE INSTRUCTIONS ° ° °1) The drugs that you were given will stay in your system until tomorrow so for the next 24 hours you should not: ° °A) Drive an automobile °B) Make any legal decisions °C) Drink any alcoholic beverage ° ° °2) You may resume regular meals   tomorrow.  Today it is better to start with liquids and gradually work up to solid foods. ° °You may eat anything you prefer, but it is better to start with liquids, then soup and crackers, and gradually work up to solid foods. ° ° °3) Please notify your doctor immediately if you have any unusual bleeding, trouble breathing, redness and pain at the surgery site, drainage, fever, or pain not relieved by medication. ° ° ° °4) Additional  Instructions: ° ° ° ° ° ° ° °Please contact your physician with any problems or Same Day Surgery at [phone], Monday through Friday 6 am to 4 pm, or [place] [facility] [place] [facility] number at [phone]. °

## 2018-06-12 NOTE — Op Note (Signed)
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 Cervix grossly showed no lesions.    PROCEDURE IN DETAIL: After informed consent was obtained, the patient was taken to the operating room where general anesthesia was administered without difficulty. The patient was positioned in the dorsal lithotomy position in [place]. Time-out was performed. The patient was examined under anesthesia, and the above findings were noted. She was prepped and draped in normal sterile fashion. The patient's bladder was cathaterized with an in and out catheter.  A weighted speculum and deaver were placed inside the patient's vagina. The above mentioned findings were noted. A sound was then used to trace the canal and it's width. IUD strings were guided into a plastic straw to protect against severing.  Lidocaine with epinephrine 5 mL each placed at the 2,4,8,10 o'clock positions of the cervix.  Stay sutures placed at the 3 and 9 o'clock positions.  Using the scapel,  the conization was performed, making sure to encompass the entire lesion and transformation zone. The cone bed was then cauterized to achieve hemostasis. Speculum was removed and patient awoken from anesthesia.  The patient tolerated the procedure well. Sponge, lap, needle, and instrument counts were correct x 2. The patient was transferred to the recovery room awake, alert and breathing independently in stable condition.  Barnett Applebaum, MD, [facility], [facility] 06/12/2018  9:56  AM

## 2018-06-12 NOTE — Anesthesia Post-op Follow-up Note (Signed)
Anesthesia QCDR form completed.        

## 2018-06-12 NOTE — Anesthesia Procedure Notes (Signed)
Procedure Name: LMA Insertion Date/Time: 06/12/2018 8:50 AM Performed by: Eben Burow, CRNA Pre-anesthesia Checklist: Patient identified, Emergency Drugs available, Suction available and Patient being monitored Patient Re-evaluated:Patient Re-evaluated prior to induction Oxygen Delivery Method: Circle system utilized Preoxygenation: Pre-oxygenation with 100% oxygen Induction Type: IV induction Ventilation: Mask ventilation without difficulty LMA: LMA inserted LMA Size: 4.0 Number of attempts: 1 Placement Confirmation: positive ETCO2 and breath sounds checked- equal and bilateral Tube secured with: Tape Dental Injury: Teeth and Oropharynx as per pre-operative assessment

## 2018-06-12 NOTE — Anesthesia Postprocedure Evaluation (Signed)
Anesthesia Post Note  Patient: Kristen Mcpherson  Procedure(s) Performed: CONIZATION CERVIX WITH BIOPSY - COLD KNIFE (N/A )  Patient location during evaluation: PACU Anesthesia Type: General Level of consciousness: awake and alert Pain management: pain level controlled Vital Signs Assessment: post-procedure vital signs reviewed and stable Respiratory status: spontaneous breathing, nonlabored ventilation, respiratory function stable and patient connected to nasal cannula oxygen Cardiovascular status: blood pressure returned to baseline and stable Postop Assessment: no apparent nausea or vomiting Anesthetic complications: no     Last Vitals:  Vitals:   06/12/18 1043 06/12/18 1057  BP: 108/60 (!) 107/54  Pulse: 85 90  Resp: 16 14  Temp: (!) 36.3 C 36.6 C  SpO2: 98% 100%    Last Pain:  Vitals:   06/12/18 1057  TempSrc: Temporal  PainSc: 0-No pain                 Mourad Cwikla S

## 2018-06-12 NOTE — Anesthesia Preprocedure Evaluation (Signed)
Anesthesia Evaluation  Patient identified by MRN, date of birth, ID band Patient awake    Reviewed: Allergy & Precautions, NPO status , Patient's Chart, lab work & pertinent test results, reviewed documented beta blocker date and time   Airway Mallampati: II  TM Distance: >3 FB     Dental  (+) Chipped   Pulmonary asthma , former smoker,           Cardiovascular      Neuro/Psych PSYCHIATRIC DISORDERS Anxiety Depression    GI/Hepatic   Endo/Other    Renal/GU      Musculoskeletal   Abdominal   Peds  Hematology  (+) anemia ,   Anesthesia Other Findings   Reproductive/Obstetrics                             Anesthesia Physical Anesthesia Plan  ASA: II  Anesthesia Plan: General   Post-op Pain Management:    Induction: Intravenous  PONV Risk Score and Plan:   Airway Management Planned: LMA  Additional Equipment:   Intra-op Plan:   Post-operative Plan:   Informed Consent: I have reviewed the patients History and Physical, chart, labs and discussed the procedure including the risks, benefits and alternatives for the proposed anesthesia with the patient or authorized representative who has indicated his/her understanding and acceptance.       Plan Discussed with: CRNA  Anesthesia Plan Comments:         Anesthesia Quick Evaluation

## 2018-06-12 NOTE — Transfer of Care (Signed)
Immediate Anesthesia Transfer of Care Note  Patient: Kristen Mcpherson  Procedure(s) Performed: CONIZATION CERVIX WITH BIOPSY - COLD KNIFE (N/A )  Patient Location: PACU  Anesthesia Type:General  Level of Consciousness: awake and patient cooperative  Airway & Oxygen Therapy: Patient Spontanous Breathing and Patient connected to face mask oxygen  Post-op Assessment: Report given to RN and Post -op Vital signs reviewed and stable  Post vital signs: Reviewed and stable  Last Vitals:  Vitals Value Taken Time  BP 120/73 06/12/18 0958  Temp 36.2 C 06/12/18 0958  Pulse 101 06/12/18 1004  Resp 18 06/12/18 1004  SpO2 100 % 06/12/18 1004  Vitals shown include unvalidated device data.  Last Pain:  Vitals:   06/12/18 0958  TempSrc:   PainSc: Asleep         Complications: No apparent anesthesia complications

## 2018-06-12 NOTE — Interval H&P Note (Signed)
History and Physical Interval Note:  06/12/2018 8:07 AM  Kristen Mcpherson  has presented today for surgery, with the diagnosis of CIN III.  The various methods of treatment have been discussed with the patient and family. After consideration of risks, benefits and other options for treatment, the patient has consented to  Procedure(s): CONIZATION CERVIX WITH BIOPSY - COLD KNIFE (N/A) as a surgical intervention.  The patient's history has been reviewed, patient examined, no change in status, stable for surgery.  I have reviewed the patient's chart and labs.  Questions were answered to the patient's satisfaction.     Hoyt Koch

## 2018-06-13 ENCOUNTER — Encounter: Payer: Self-pay | Admitting: Obstetrics & Gynecology

## 2018-06-13 LAB — SURGICAL PATHOLOGY

## 2018-06-16 ENCOUNTER — Telehealth: Payer: Self-pay

## 2018-06-16 NOTE — Telephone Encounter (Signed)
changing pad 2-3 times a day, odor is strong, pt aware of what you said, but is very worried about the odor, the blood is not heavy but a good amount on the pad, not like a period. She has to wash every time she changes the pad because of the odor

## 2018-06-16 NOTE — Telephone Encounter (Signed)
Pt calling; had surgery c PH last Thurs; hasn't stopped bleeding since - dary, grayish looking, hard to describe. Changes pads 3-4 times a day. [phone]

## 2018-06-16 NOTE — Telephone Encounter (Signed)
Appt wed w me (or tomorrow w AMS if cant wait)

## 2018-06-16 NOTE — Telephone Encounter (Signed)
Please advise 

## 2018-06-16 NOTE — Telephone Encounter (Signed)
Give more time as likely  will stop on its own Call back wed-thurs to be seen if persists/worrisome

## 2018-06-17 NOTE — Telephone Encounter (Signed)
Patient is schedule 06/18/18 °

## 2018-06-18 ENCOUNTER — Ambulatory Visit (INDEPENDENT_AMBULATORY_CARE_PROVIDER_SITE_OTHER): Payer: Medicaid Other | Admitting: Obstetrics & Gynecology

## 2018-06-18 ENCOUNTER — Encounter: Payer: Self-pay | Admitting: Obstetrics & Gynecology

## 2018-06-18 ENCOUNTER — Other Ambulatory Visit: Payer: Self-pay

## 2018-06-18 VITALS — BP 120/80 | Ht 66.0 in | Wt 188.0 lb

## 2018-06-18 DIAGNOSIS — D069 Carcinoma in situ of cervix, unspecified: Secondary | ICD-10-CM

## 2018-06-18 NOTE — Progress Notes (Signed)
  Postoperative Follow-up Patient presents post op from LEEP for CIN III, 1 week ago.  Subjective: Patient reports vag black discharge; odor.  No pain or bleeding.  Pathology: DIAGNOSIS:  A. URINE CERVIX; CONIZATION:  - HIGH-GRADE SQUAMOUS INTRAEPITHELIAL LESION (CIN-3, SEVERE DYSPLASIA),  WITH ENDOCERVICAL GLANDULAR INVOLVEMENT, INVOLVING 6-9 AND 9-12:00  QUADRANTS.  - ALL SURGICAL MARGINS FREE OF DYSPLASIA.  - NEGATIVE FOR MALIGNANCY.   Objective: BP 120/80   Ht 5\' 6"  (1.676 m)   Wt 188 lb (85.3 kg)   LMP 05/29/2018   BMI 30.34 kg/m  Physical Exam Constitutional:      General: She is not in acute distress.    Appearance: She is well-developed.  Genitourinary:     Pelvic exam was performed with patient supine.     Vagina, uterus and rectum normal.     No vaginal erythema or bleeding.     No cervical motion tenderness, discharge or lesion.     Uterus is not enlarged or tender.     No uterine mass detected.    Uterus is midaxial.     No right or left adnexal mass present.     Right adnexa not tender.     Left adnexa not tender.     Genitourinary Comments: Cervix healing w black eschar, no lacerations ulcers or bleeding  Cardiovascular:     Rate and Rhythm: Normal rate.  Pulmonary:     Effort: Pulmonary effort is normal.  Abdominal:     General: There is no distension.     Palpations: Abdomen is soft.     Tenderness: There is no abdominal tenderness.  Musculoskeletal: Normal range of motion.  Neurological:     Mental Status: She is alert and oriented to person, place, and time.     Cranial Nerves: No cranial nerve deficit.  Skin:    General: Skin is warm and dry.     Assessment: s/p :  LEEP stable  Plan: Patient has done well after surgery with no apparent complications.  I have discussed the post-operative course to date, and the expected progress moving forward.  The patient understands what complications to be concerned about.  I will see the patient in  routine follow up, or sooner if needed.    Activity plan: No restriction.  Pelvic rest one week.  PAP 3 mos  Hoyt Koch 06/18/2018, 3:46 PM

## 2018-07-09 ENCOUNTER — Inpatient Hospital Stay: Payer: Medicaid Other | Attending: Oncology

## 2018-07-16 ENCOUNTER — Other Ambulatory Visit: Payer: Self-pay | Admitting: Obstetrics & Gynecology

## 2018-07-16 ENCOUNTER — Telehealth: Payer: Self-pay | Admitting: Obstetrics & Gynecology

## 2018-07-16 NOTE — Telephone Encounter (Signed)
Pt needs a weight check apt before refill can be sent in

## 2018-07-16 NOTE — Telephone Encounter (Signed)
Patient requesting refill on phentermine, only has one left, states everything is going well.  Would like to know if Rx could be sent to [facility].  Aware RPH not in office.

## 2018-07-17 NOTE — Telephone Encounter (Signed)
Called and left voice mail for patient to call back to be schedule °

## 2018-07-22 ENCOUNTER — Encounter: Payer: Self-pay | Admitting: Obstetrics & Gynecology

## 2018-07-22 ENCOUNTER — Ambulatory Visit (INDEPENDENT_AMBULATORY_CARE_PROVIDER_SITE_OTHER): Payer: Medicaid Other | Admitting: Obstetrics & Gynecology

## 2018-07-22 ENCOUNTER — Other Ambulatory Visit: Payer: Self-pay

## 2018-07-22 VITALS — Ht 66.0 in | Wt 182.0 lb

## 2018-07-22 DIAGNOSIS — E669 Obesity, unspecified: Secondary | ICD-10-CM | POA: Diagnosis not present

## 2018-07-22 MED ORDER — PHENTERMINE HCL 37.5 MG PO TABS: 37.5000 mg | ORAL_TABLET | Freq: Every day | ORAL | 1 refills | Status: DC

## 2018-07-22 NOTE — Progress Notes (Signed)
Virtual Visit via Telephone Note  I connected with Kristen Mcpherson on 07/22/18 at 11:20 AM EDT by telephone and verified that I am speaking with the correct person using two identifiers.   I discussed the limitations, risks, security and privacy concerns of performing an evaluation and management service by telephone and the availability of in person appointments. I also discussed with the patient that there may be a patient responsible charge related to this service. The patient expressed understanding and agreed to proceed. She was at home and I was in my office.  History of Present Illness:  Kristen Mcpherson is a 38 y.o. who was started on Phentermine approximately 1 month ago due to obesity/abnormal weight gain. The patient has lost 6 pounds over the past month due to lifestyle changes and meds.   She has these side effects: none.  PMHx: She  has a past medical history of Anemia, Anxiety, Asthma, and Iron deficiency anemia (01/08/2018). Also,  has a past surgical history that includes Appendectomy; Cesarean section (N/A, 03/20/2018); Tubal ligation (Bilateral, 03/20/2018); and Cervical conization w/bx (N/A, 06/12/2018)., family history includes ADD / ADHD in her brother; Anemia in her mother; Anxiety disorder in her mother; Arthritis in her mother; Depression in her father and mother; Diabetes in her mother; Hyperlipidemia in her father; Hypertension in her father.,  reports that she quit smoking about 18 months ago. She has a 7.50 pack-year smoking history. She has never used smokeless tobacco. She reports previous alcohol use. She reports that she does not use drugs.  She has a current medication list which includes the following prescription(s): ferrous sulfate, ibuprofen, levonorgestrel, oxycodone-acetaminophen, phentermine, topiramate, and valacyclovir. Also, has No Known Allergies.  Review of Systems  All other systems reviewed and are negative.   Observations/Objective: No exam today, due  to telephone eVisit due to [facility] virus restriction on elective visits and procedures.  Prior visits reviewed along with ultrasounds/labs as indicated. Reported weight: 182 lbs    Prior weight 188 lbs (BMI 30)  Assessment and Plan: 1. Obesity (BMI 30-39.9) - cont meds and lifestyle measures - phentermine (ADIPEX-P) 37.5 MG tablet; Take 1 tablet (37.5 mg total) by mouth daily before breakfast.  Dispense: 30 tablet; Refill: 1 - Will continue to assist patient in incorporating positive experiences into her life to promote a positive mental attitude.  Education given regarding appropriate lifestyle changes for weight loss, including regular physical activity, healthy coping strategies, caloric restriction, and healthy eating patterns. - The risks and benefits as well as side effects of medication, such as Phenteramine or Tenuate, is discussed.  The pros and cons of suppressing appetite and boosting metabolism is counseled.  Risks of tolerance and addiction discussed.  Use of medicine will be short term.  Pt to call with any negative side effects and agrees to keep follow up appointments.   Follow Up Instructions: 2 mos follow up Has appt then for PAP f/u too   I discussed the assessment and treatment plan with the patient. The patient was provided an opportunity to ask questions and all were answered. The patient agreed with the plan and demonstrated an understanding of the instructions.   The patient was advised to call back or seek an in-person evaluation if the symptoms worsen or if the condition fails to improve as anticipated.  I provided 12 minutes of non-face-to-face time during this encounter.   Hoyt Koch, MD

## 2018-07-29 DIAGNOSIS — L03119 Cellulitis of unspecified part of limb: Secondary | ICD-10-CM | POA: Diagnosis not present

## 2018-08-28 ENCOUNTER — Ambulatory Visit (INDEPENDENT_AMBULATORY_CARE_PROVIDER_SITE_OTHER): Payer: Medicaid Other | Admitting: Family Medicine

## 2018-08-28 ENCOUNTER — Encounter: Payer: Self-pay | Admitting: Family Medicine

## 2018-08-28 ENCOUNTER — Other Ambulatory Visit: Payer: Self-pay

## 2018-08-28 VITALS — Ht 66.0 in | Wt 182.0 lb

## 2018-08-28 DIAGNOSIS — F329 Major depressive disorder, single episode, unspecified: Secondary | ICD-10-CM

## 2018-08-28 DIAGNOSIS — R34 Anuria and oliguria: Secondary | ICD-10-CM | POA: Diagnosis not present

## 2018-08-28 DIAGNOSIS — G8929 Other chronic pain: Secondary | ICD-10-CM | POA: Diagnosis not present

## 2018-08-28 DIAGNOSIS — M5442 Lumbago with sciatica, left side: Secondary | ICD-10-CM

## 2018-08-28 DIAGNOSIS — M5441 Lumbago with sciatica, right side: Secondary | ICD-10-CM

## 2018-08-28 DIAGNOSIS — F32A Depression, unspecified: Secondary | ICD-10-CM

## 2018-08-28 DIAGNOSIS — Z7689 Persons encountering health services in other specified circumstances: Secondary | ICD-10-CM | POA: Diagnosis not present

## 2018-08-28 DIAGNOSIS — F419 Anxiety disorder, unspecified: Secondary | ICD-10-CM

## 2018-08-28 DIAGNOSIS — K5900 Constipation, unspecified: Secondary | ICD-10-CM | POA: Diagnosis not present

## 2018-08-28 MED ORDER — PREDNISONE 5 MG (48) PO TBPK: ORAL_TABLET | ORAL | 0 refills | Status: DC

## 2018-08-28 MED ORDER — CYCLOBENZAPRINE HCL 10 MG PO TABS: 10.0000 mg | ORAL_TABLET | Freq: Three times a day (TID) | ORAL | 0 refills | Status: DC | PRN

## 2018-08-28 MED ORDER — LUBIPROSTONE 8 MCG PO CAPS: 8.0000 ug | ORAL_CAPSULE | Freq: Two times a day (BID) | ORAL | 1 refills | Status: DC

## 2018-08-28 MED ORDER — DOCUSATE SODIUM 100 MG PO CAPS: 100.0000 mg | ORAL_CAPSULE | Freq: Two times a day (BID) | ORAL | 0 refills | Status: DC

## 2018-08-28 NOTE — Progress Notes (Signed)
Name: Kristen Mcpherson   MRN: [id number]    DOB: 20-May-1980   Date:08/28/2018       Progress Note  Subjective  Chief Complaint  Chief Complaint  Patient presents with  . Establish Care    I connected with  Sara Chu  on 08/28/18 at  1:20 PM EDT by a video enabled telemedicine application and verified that I am speaking with the correct person using two identifiers.  I discussed the limitations of evaluation and management by telemedicine and the availability of in person appointments. The patient expressed understanding and agreed to proceed. Staff also discussed with the patient that there may be a patient responsible charge related to this service. Patient Location: Home Provider Location: Office Additional Individuals present: None  HPI  Abnormal Pap Smear Hx: Seeing Dr. Bonney Aid for follow up on this.  She also has mirena in place for heavy menses- following with Dr. Bonney Aid.   Hx C-section/Changes in urination: Did talk to Dr. Tiburcio Pea with GYN regarding this, she is having some numbness causing a lack of urge to urinate.  She is having to remind herself to urinate throughout the day.  Recommend follow up with Dr. Tiburcio Pea and possibly referral to urology after follow up with Dr. Tiburcio Pea.  Constipation: A change since having her 3mo daughter.  She is going several days without BM's.  She has had to manually disimpact herself.  She is taking stool softener (colace); has tried miralax and linzess both for a few weeks without relief of her constipation. Has abdominal discomfort which is relieved with BM's; no blood in stool.  We will refer to GI if medication changes do not help.  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  Mom had hx DDD with multiple back surgeries. Has tried ibuprofen,  heating pads, etc.  The pain starts in right lower side the and radiates into BLE.  Has trouble picking her 34mo off of the ground/tub because of pain, will have spasms sometimes.   Depression and Anxiety: Feels like she is doing well with depression, but struggles some days with her anxiety. She is having anger/irritability.  She took zoloft when pregnant, but it did not help.  She states the only thing that worked for her were "real xanax's".  She does not want to take medication at this time.  Patient Active Problem List   Diagnosis Date Noted  . CIN III (cervical intraepithelial neoplasia grade III) with severe dysplasia 06/05/2018  . Cesarean delivery indicated due to breech presentation 04/03/2018  . Normal labor and delivery 03/20/2018  . Breech presentation delivered 03/20/2018  . Delivery of pregnancy by cesarean section 03/20/2018  . Positive GBS test 03/04/2018  . Iron deficiency anemia 01/08/2018  . Hypotension 01/03/2018  . Abdominal pain affecting pregnancy, antepartum 12/06/2017  . 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 HSIL on Pap smear of cervix 10/07/2017  . History of gestational diabetes in prior pregnancy, currently pregnant in third trimester 08/29/2017  . AMA (advanced maternal age) multigravida 35+, second trimester 08/29/2017  . High-risk pregnancy, third trimester 08/29/2017  . Anxiety and depression 08/29/2017    Past Surgical History:  Procedure Laterality Date  . APPENDECTOMY    . CERVICAL CONIZATION W/BX N/A 06/12/2018   Procedure:  CONIZATION CERVIX WITH BIOPSY - COLD KNIFE;  Surgeon: Nadara MustardHarris, Robert P, MD;  Location: [facility];  Service: Gynecology;  Laterality: N/A;  . CESAREAN SECTION N/A 03/20/2018   Procedure: CESAREAN SECTION;  Surgeon: Nadara MustardHarris, Robert P, MD;  Location: [facility] OR[facility];  Service: Obstetrics;  Laterality: N/A;  . TUBAL LIGATION Bilateral 03/20/2018   Procedure: BILATERAL TUBAL LIGATION;  Surgeon: Nadara MustardHarris, Robert P, MD;  Location: [facility];  Service:  Obstetrics;  Laterality: Bilateral;    Family History  Problem Relation Age of Onset  . Diabetes Mother   . Arthritis Mother   . Depression Mother   . Anxiety disorder Mother   . Anemia Mother   . Stroke Mother   . Restless legs syndrome Mother   . Hyperlipidemia Father   . Depression Father   . Hypertension Father   . ADD / ADHD Brother   . Diabetes Maternal Grandmother   . Liver disease Maternal Grandmother   . Rheum arthritis Maternal Grandfather   . Dementia Maternal Grandfather     Social History   Socioeconomic History  . Marital status: Single    Spouse name: Not on file  . Number of children: 6  . Years of education: Not on file  . Highest education level: Not on file  Occupational History  . Occupation: Stay at home mother  Social Needs  . Financial resource strain: Not hard at all  . Food insecurity    Worry: Never true    Inability: Never true  . Transportation needs    Medical: No    Non-medical: No  Tobacco Use  . Smoking status: Current Every Day Smoker    Packs/day: 0.50    Types: Cigarettes    Start date: 01/02/2000  . Smokeless tobacco: Never Used  Substance and Sexual Activity  . Alcohol use: Yes    Comment: once in a blue moon  . Drug use: Never  . Sexual activity: Yes    Birth control/protection: Surgical    Comment: BTL  Lifestyle  . Physical activity    Days per week: 5 days    Minutes per session: 30 min  . Stress: Not at all  Relationships  . Social connections    Talks on phone: More than three times a week    Gets together: More than three times a week    Attends religious service: Never    Active member of club or organization: No    Attends meetings of clubs or organizations: Never    Relationship status: Living with partner  . Intimate partner violence    Fear of current or ex partner: No    Emotionally abused: No    Physically abused: No    Forced sexual activity: No  Other Topics Concern  . Not on file  Social  History Narrative   Is engaged has 6 children and her fiance has 2 children. Takes care of 8 children all together.     Current Outpatient Medications:  .  ibuprofen (ADVIL) 200 MG tablet, Take 200 mg by mouth every 6 (six) hours as needed., Disp: , Rfl:  .  levonorgestrel (MIRENA) 20 MCG/24HR IUD, 1 each by Intrauterine route once., Disp: , Rfl:  .  phentermine (ADIPEX-P) 37.5 MG tablet, Take 1 tablet (37.5 mg total) by mouth daily before breakfast., Disp: 30 tablet, Rfl: 1 .  topiramate (TOPAMAX) 25 MG tablet, Take 1 tablet (25 mg total) by mouth 2 (two) times daily., Disp: 60 tablet, Rfl: 4 .  ferrous sulfate 325 (65 FE) MG tablet, TAKE 1 TABLET BY MOUTH TWICE A DAY (Patient not taking: Reported on 08/28/2018), Disp: 60 tablet, Rfl: 1 .  oxyCODONE-acetaminophen (PERCOCET/ROXICET) 5-325 MG tablet, Take 1 tablet by mouth every 4 (four) hours as needed for moderate pain. (Patient not taking: Reported on 08/28/2018), Disp: 10 tablet, Rfl: 0 .  valACYclovir (VALTREX) 500 MG tablet, Take 1 tablet (500 mg total) by mouth 2 (two) times daily. (Patient not taking: Reported on 08/28/2018), Disp: 60 tablet, Rfl: 6  No Known Allergies  I personally reviewed active problem list, medication list, allergies, notes from last encounter, lab results with the patient/caregiver today.   ROS  Constitutional: Negative for fever or weight change.  Respiratory: Negative for cough and shortness of breath.   Cardiovascular: Negative for chest pain or palpitations.  Gastrointestinal: Negative for abdominal pain, no bowel changes.  Musculoskeletal: Negative for gait problem or joint swelling.  Skin: Negative for rash.  Neurological: Negative for dizziness or headache.  No other specific complaints in a complete review of systems (except as listed in HPI above).  Objective  Virtual encounter, vitals not obtained.  Body mass index is 29.38 kg/m.  Physical Exam Constitutional: Patient appears well-developed  and well-nourished. No distress.  HENT: Head: Normocephalic and atraumatic.  Neck: Normal range of motion. Pulmonary/Chest: Effort normal. No respiratory distress. Speaking in complete sentences Neurological: Pt is alert and oriented to person, place, and time. Coordination, speech and gait are normal.  Psychiatric: Patient has a normal mood and affect. behavior is normal. Judgment and thought content normal.  No results found for this or any previous visit (from the past 72 hour(s)).  PHQ2/9: Depression screen [facility] 08/28/2018 08/29/2017  Decreased Interest 0 3  Down, Depressed, Hopeless 0 3  PHQ - 2 Score 0 6  Altered sleeping 0 3  Tired, decreased energy 0 3  Change in appetite 0 3  Feeling bad or failure about yourself  0 2  Trouble concentrating 0 2  Moving slowly or fidgety/restless 0 3  Suicidal thoughts 0 0  PHQ-9 Score 0 22  Difficult doing work/chores Not difficult at all Very difficult   PHQ-2/9 Result is negative.    Fall Risk: Fall Risk  08/28/2018  Falls in the past year? 0  Number falls in past yr: 0  Injury with Fall? 0   Assessment & Plan  1. Chronic right-sided low back pain with bilateral sciatica - Ambulatory referral to Orthopedics - cyclobenzaprine (FLEXERIL) 10 MG tablet; Take 1 tablet (10 mg total) by mouth 3 (three) times daily as needed for muscle spasms.  Dispense: 30 tablet; Refill: 0 - predniSONE (STERAPRED UNI-PAK 48 TAB) 5 MG (48) TBPK tablet; Take as directed  Dispense: 48 tablet; Refill: 0  2. Encounter to establish care  3. Decreased urination - Seeing GYN for care at this time.  4. Constipation, unspecified constipation type - docusate sodium (COLACE) 100 MG capsule; Take 1 capsule (100 mg total) by mouth 2 (two) times daily.  Dispense: 30 capsule; Refill: 0 - lubiprostone (AMITIZA) 8 MCG capsule; Take 1 capsule (8 mcg total) by mouth 2 (two) times daily with a meal.  Dispense: 180 capsule; Refill: 1  5. Anxiety and depression -  Declines medication at this time; will monitor  I discussed the assessment and treatment plan with the patient. The patient was provided an opportunity to ask questions and all were answered. The patient agreed with the plan and demonstrated an understanding of the  instructions.  The patient was advised to call back or seek an in-person evaluation if the symptoms worsen or if the condition fails to improve as anticipated.  I provided 28 minutes of non-face-to-face time during this encounter.

## 2018-09-01 DIAGNOSIS — M542 Cervicalgia: Secondary | ICD-10-CM | POA: Diagnosis not present

## 2018-09-01 DIAGNOSIS — M545 Low back pain: Secondary | ICD-10-CM | POA: Diagnosis not present

## 2018-09-01 DIAGNOSIS — M546 Pain in thoracic spine: Secondary | ICD-10-CM | POA: Diagnosis not present

## 2018-09-03 ENCOUNTER — Ambulatory Visit: Payer: Medicaid Other | Admitting: Obstetrics & Gynecology

## 2018-09-04 ENCOUNTER — Other Ambulatory Visit: Payer: Self-pay

## 2018-09-04 DIAGNOSIS — D508 Other iron deficiency anemias: Secondary | ICD-10-CM

## 2018-09-04 DIAGNOSIS — D519 Vitamin B12 deficiency anemia, unspecified: Secondary | ICD-10-CM

## 2018-09-04 NOTE — Progress Notes (Deleted)
Called patient no answer, left message   Patient would like to cancel her appointment, she can not wear a mask due to her anxiety and her asthma. She will call back at a later date to R/S, said she called last week to cancel.

## 2018-09-05 ENCOUNTER — Inpatient Hospital Stay: Payer: Medicaid Other

## 2018-09-05 ENCOUNTER — Inpatient Hospital Stay: Payer: Medicaid Other | Admitting: Oncology

## 2018-09-05 ENCOUNTER — Ambulatory Visit: Payer: Medicaid Other | Admitting: Obstetrics & Gynecology

## 2018-09-11 DIAGNOSIS — M545 Low back pain: Secondary | ICD-10-CM | POA: Diagnosis not present

## 2018-09-11 DIAGNOSIS — M5412 Radiculopathy, cervical region: Secondary | ICD-10-CM | POA: Diagnosis not present

## 2018-09-11 DIAGNOSIS — M542 Cervicalgia: Secondary | ICD-10-CM | POA: Diagnosis not present

## 2018-09-11 DIAGNOSIS — R293 Abnormal posture: Secondary | ICD-10-CM | POA: Diagnosis not present

## 2018-09-12 ENCOUNTER — Ambulatory Visit (INDEPENDENT_AMBULATORY_CARE_PROVIDER_SITE_OTHER): Payer: Medicaid Other | Admitting: Obstetrics & Gynecology

## 2018-09-12 ENCOUNTER — Encounter: Payer: Self-pay | Admitting: Obstetrics & Gynecology

## 2018-09-12 ENCOUNTER — Other Ambulatory Visit (HOSPITAL_COMMUNITY)
Admission: RE | Admit: 2018-09-12 | Discharge: 2018-09-12 | Disposition: A | Discharge status: Home / Self Care | Payer: Medicaid Other | Source: Ambulatory Visit | Attending: Obstetrics & Gynecology | Admitting: Obstetrics & Gynecology

## 2018-09-12 ENCOUNTER — Other Ambulatory Visit: Payer: Self-pay

## 2018-09-12 VITALS — BP 100/60 | Ht 66.0 in | Wt 188.0 lb

## 2018-09-12 DIAGNOSIS — D069 Carcinoma in situ of cervix, unspecified: Secondary | ICD-10-CM | POA: Diagnosis not present

## 2018-09-12 NOTE — Progress Notes (Signed)
HPI:  Patient is a 38 y.o. [id number] presenting for follow up evaluation of abnormal PAP smear in the past.  Her last PAP was a few  months ago and was abnormal: HGSIL. She has had a prior colposcopy. 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   CKC done 3 mos ago, CIN III w clear margins.  Pt has IUD, is not happy that still has monthly periods and bloating and pain.  She did not do that w prior IUDs.  Does not plan any more pregnancies.  PMHx: She  has a past medical history of AMA (advanced maternal age) multigravida 35+, second trimester (08/29/2017), Anemia, Anxiety, Asthma, Breech presentation delivered (03/20/2018), Cesarean delivery indicated due to breech presentation (04/03/2018), High-risk pregnancy, third trimester (08/29/2017), and Iron deficiency anemia (01/08/2018). Also,  has a past surgical history that includes Appendectomy; Cesarean section (N/A, 03/20/2018); Tubal ligation (Bilateral, 03/20/2018); and Cervical conization w/bx (N/A, 06/12/2018)., family history includes ADD / ADHD in her brother; Anemia in her mother; Anxiety disorder in her mother; Arthritis in her mother; Dementia in her maternal grandfather; Depression in her father and mother; Diabetes in her maternal grandmother and mother; Hyperlipidemia in her father; Hypertension in her father; Liver disease in her maternal grandmother; Restless legs syndrome in her mother; Rheum arthritis in her maternal grandfather; Stroke in her mother.,  reports that she has been smoking cigarettes. She started smoking about 18 years ago. She has been smoking about 0.50 packs per day. She has never used smokeless tobacco. She reports current alcohol use. She reports that she does not use drugs.  She has a current medication list which includes the following prescription(s): cyclobenzaprine, docusate sodium, ibuprofen, levonorgestrel, lubiprostone, phentermine, prednisone, and topiramate. Also, has No Known Allergies.  Review of Systems  All other systems  reviewed and are negative.   Objective: BP 100/60   Ht 5\' 6"  (1.676 m)   Wt 188 lb (85.3 kg)   LMP 08/18/2018   BMI 30.34 kg/m  Filed Weights   09/12/18 1602  Weight: 188 lb (85.3 kg)   Body mass index is 30.34 kg/m.  Physical examination Physical Exam Constitutional:      General: She is not in acute distress.    Appearance: She is well-developed.  Genitourinary:     Pelvic exam was performed with patient supine.     Vagina and uterus normal.     No vaginal erythema or bleeding.     No cervical motion tenderness, discharge, polyp or nabothian cyst.     Uterus is mobile.     Uterus is not enlarged.     No uterine mass detected.    Uterus is midaxial.     No right or left adnexal mass present.     Right adnexa not tender.     Left adnexa not tender.  HENT:     Head: Normocephalic and atraumatic.     Nose: Nose normal.  Abdominal:     General: There is no distension.     Palpations: Abdomen is soft.     Tenderness: There is no abdominal tenderness.  Musculoskeletal: Normal range of motion.  Neurological:     Mental Status: She is alert and oriented to person, place, and time.     Cranial Nerves: No cranial nerve deficit.  Skin:    General: Skin is warm and dry.  Psychiatric:        Attention and Perception: Attention normal.        Mood and Affect:  Mood and affect normal.        Speech: Speech normal.        Behavior: Behavior normal.        Thought Content: Thought content normal.        Judgment: Judgment normal.     ASSESSMENT:  History of Cervical Dysplasia- CIN III  Plan:  1.  I discussed the grading system of pap smears and HPV high risk viral types.   2. Follow up PAP 6 months, vs intervention if high grade dysplasia identified. 3. Consider hysterectomy for pervasive CIN II if present by PAP.  Also would help w menorrhagia nd contraception which she wants.  Otherwise consider change in IUD, change to other hormonal contraception, or tubal w  ablation.   Annamarie MajorPaul Alcides Nutting, MD, Merlinda FrederickFACOG [facility], [facility] 09/12/2018  4:30 PM

## 2018-09-16 LAB — CYTOLOGY - PAP: Diagnosis: NEGATIVE

## 2018-10-06 ENCOUNTER — Inpatient Hospital Stay: Payer: Medicaid Other | Attending: Oncology

## 2018-12-01 ENCOUNTER — Ambulatory Visit (INDEPENDENT_AMBULATORY_CARE_PROVIDER_SITE_OTHER): Payer: Medicaid Other | Admitting: Family Medicine

## 2018-12-01 ENCOUNTER — Encounter: Payer: Self-pay | Admitting: Family Medicine

## 2018-12-01 ENCOUNTER — Other Ambulatory Visit: Payer: Self-pay

## 2018-12-01 ENCOUNTER — Ambulatory Visit: Payer: Medicaid Other | Admitting: Obstetrics & Gynecology

## 2018-12-01 DIAGNOSIS — G8929 Other chronic pain: Secondary | ICD-10-CM | POA: Diagnosis not present

## 2018-12-01 DIAGNOSIS — F329 Major depressive disorder, single episode, unspecified: Secondary | ICD-10-CM | POA: Diagnosis not present

## 2018-12-01 DIAGNOSIS — M5441 Lumbago with sciatica, right side: Secondary | ICD-10-CM | POA: Diagnosis not present

## 2018-12-01 DIAGNOSIS — K5900 Constipation, unspecified: Secondary | ICD-10-CM | POA: Diagnosis not present

## 2018-12-01 DIAGNOSIS — R34 Anuria and oliguria: Secondary | ICD-10-CM

## 2018-12-01 DIAGNOSIS — F419 Anxiety disorder, unspecified: Secondary | ICD-10-CM | POA: Diagnosis not present

## 2018-12-01 DIAGNOSIS — M5442 Lumbago with sciatica, left side: Secondary | ICD-10-CM | POA: Diagnosis not present

## 2018-12-01 DIAGNOSIS — F32A Depression, unspecified: Secondary | ICD-10-CM

## 2018-12-01 MED ORDER — VENLAFAXINE HCL ER 37.5 MG PO CP24: ORAL_CAPSULE | ORAL | 1 refills | Status: DC

## 2018-12-01 MED ORDER — BUSPIRONE HCL 5 MG PO TABS: ORAL_TABLET | ORAL | 1 refills | Status: DC

## 2018-12-01 NOTE — Progress Notes (Signed)
Name: Kristen Mcpherson   MRN: [id number]    DOB: [birth year]   Date:12/01/2018       Progress Note  Subjective  Chief Complaint  Chief Complaint  Patient presents with  . Follow-up    I connected with  Sara Chu  on 12/01/18 at  1:20 PM EST by a video enabled telemedicine application and verified that I am speaking with the correct person using two identifiers.  I discussed the limitations of evaluation and management by telemedicine and the availability of in person appointments. The patient expressed understanding and agreed to proceed. Staff also discussed with the patient that there may be a patient responsible charge related to this service. Patient Location: Home Provider Location: Office Additional Individuals present: None  HPI  Hx C-section/Changes in urination: Did talk to Dr. Tiburcio Pea with GYN regarding this, she is having some numbness causing a lack of urge to urinate.  She is having to remind herself to urinate throughout the day.  Seeing Dr.  Tiburcio Pea, taking medications as prescribed and this seems to be helping with her urinary issues; Dr. Tiburcio Pea advised to give some time to heal - will refer to urology is still having issues at her next follow up with him.  Constipation: A change since having her 21mo daughter.  She is going several days without BM's.  She has had to manually disimpact herself.  She is now taking amitiza and having more regular BM's - sometimes forgets to take the medications. No blood in stool, dark and tarry stool, or abdominal pain.  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  Mom had hx DDD with multiple back surgeries. Has tried ibuprofen, heating pads, etc.  The pain starts in right lower side the and radiates into BLE.  Saw back and spine specialist,  did a few visits of physical therapy.  Flexeril and other muscle relaxers does not help her pain - just makes her drowsy.  Depression and Anxiety: Feels like she is doing well with depression, but struggles some days with her anxiety. She is having anger/irritability.  She took zoloft when pregnant, but it did not help.  She states the only thing that worked for her were "real xanax's". She is initially reluctant to discuss medications, I reviewed several options, and she is open to trying Effexor and buspar.  She is stressed being at home all of the time with her children.  Having trouble sleeping.  Does not want to try hydroxyzine.    Office Visit from 12/01/2018 in [facility]  PHQ-9 Total Score  17     GAD 7 : Generalized Anxiety Score 08/29/2017  Nervous, Anxious, on Edge 3  Control/stop worrying 3  Worry too much - different things 3  Trouble relaxing 3  Restless 3  Easily annoyed or irritable 3  Afraid - awful might happen 2  Total GAD 7 Score 20  Anxiety Difficulty Very difficult    Patient Active Problem List   Diagnosis Date Noted  . CIN III (cervical intraepithelial neoplasia grade III) with severe dysplasia 06/05/2018  . Normal labor and delivery 03/20/2018  . Positive GBS test 03/04/2018  . Iron deficiency anemia 01/08/2018  . Hypotension 01/03/2018  . Abdominal pain affecting pregnancy, antepartum  12/06/2017  . 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 HSIL on Pap smear of cervix 10/07/2017  . History of gestational diabetes in prior pregnancy, currently pregnant in third trimester 08/29/2017  . Anxiety and depression 08/29/2017    Past Surgical History:  Procedure Laterality Date  . APPENDECTOMY    . CERVICAL CONIZATION W/BX N/A 06/12/2018   Procedure: CONIZATION CERVIX WITH BIOPSY - COLD KNIFE;  Surgeon: Nadara MustardHarris, Robert P, MD;  Location: [facility] ORS;  Service: Gynecology;  Laterality: N/A;  . CESAREAN SECTION N/A 03/20/2018   Procedure: CESAREAN  SECTION;  Surgeon: Nadara MustardHarris, Robert P, MD;  Location: [facility] ORS;  Service: Obstetrics;  Laterality: N/A;  . TUBAL LIGATION Bilateral 03/20/2018   Procedure: BILATERAL TUBAL LIGATION;  Surgeon: Nadara MustardHarris, Robert P, MD;  Location: [facility] ORS;  Service: Obstetrics;  Laterality: Bilateral;    Family History  Problem Relation Age of Onset  . Diabetes Mother   . Arthritis Mother   . Depression Mother   . Anxiety disorder Mother   . Anemia Mother   . Stroke Mother   . Restless legs syndrome Mother   . Hyperlipidemia Father   . Depression Father   . Hypertension Father   . ADD / ADHD Brother   . Diabetes Maternal Grandmother   . Liver disease Maternal Grandmother   . Rheum arthritis Maternal Grandfather   . Dementia Maternal Grandfather     Social History   Socioeconomic History  . Marital status: Single    Spouse name: Not on file  . Number of children: 6  . Years of education: Not on file  . Highest education level: Not on file  Occupational History  . Occupation: Stay at home mother  Social Needs  . Financial resource strain: Not hard at all  . Food insecurity    Worry: Never true    Inability: Never true  . Transportation needs    Medical: No    Non-medical: No  Tobacco Use  . Smoking status: Current Every Day Smoker    Packs/day: 0.50    Types: Cigarettes    Start date: 01/02/2000  . Smokeless tobacco: Never Used  Substance and Sexual Activity  . Alcohol use: Yes    Comment: once in a blue moon  . Drug use: Never  . Sexual activity: Yes    Birth control/protection: Surgical    Comment: BTL  Lifestyle  . Physical activity    Days per week: 5 days    Minutes per session: 30 min  . Stress: Not at all  Relationships  . Social connections    Talks on phone: More than three times a week    Gets together: More than three times a week    Attends religious service: Never    Active member of club or organization: No    Attends meetings of clubs or organizations: Never     Relationship status: Living with partner  . Intimate partner violence    Fear of current or ex partner: No    Emotionally abused: No    Physically abused: No    Forced sexual activity: No  Other Topics Concern  . Not on file  Social History Narrative   Is engaged has 6 children and her fiance has 2 children. Takes care of 8 children all together.     Current Outpatient Medications:  .  cyclobenzaprine (FLEXERIL) 10 MG tablet, Take 1 tablet (10 mg total) by mouth 3 (three) times daily as needed for muscle spasms., Disp:  30 tablet, Rfl: 0 .  docusate sodium (COLACE) 100 MG capsule, Take 1 capsule (100 mg total) by mouth 2 (two) times daily., Disp: 30 capsule, Rfl: 0 .  ibuprofen (ADVIL) 200 MG tablet, Take 200 mg by mouth every 6 (six) hours as needed., Disp: , Rfl:  .  levonorgestrel (MIRENA) 20 MCG/24HR IUD, 1 each by Intrauterine route once., Disp: , Rfl:  .  lubiprostone (AMITIZA) 8 MCG capsule, Take 1 capsule (8 mcg total) by mouth 2 (two) times daily with a meal., Disp: 180 capsule, Rfl: 1 .  phentermine (ADIPEX-P) 37.5 MG tablet, Take 1 tablet (37.5 mg total) by mouth daily before breakfast., Disp: 30 tablet, Rfl: 1 .  topiramate (TOPAMAX) 25 MG tablet, Take 1 tablet (25 mg total) by mouth 2 (two) times daily., Disp: 60 tablet, Rfl: 4 .  predniSONE (STERAPRED UNI-PAK 48 TAB) 5 MG (48) TBPK tablet, Take as directed (Patient not taking: Reported on 12/01/2018), Disp: 48 tablet, Rfl: 0  No Known Allergies  I personally reviewed active problem list, medication list, allergies, notes from last encounter, lab results with the patient/caregiver today.   ROS  Ten systems reviewed and is negative except as mentioned in HPI  Objective  Virtual encounter, vitals not obtained.  There is no height or weight on file to calculate BMI.  Physical Exam  Pulmonary/Chest: Effort normal. No respiratory distress. Speaking in complete sentences Neurological: Pt is alert and oriented to person,  place, and time. Speech is normal Psychiatric: Patient has an anxious mood and affect. behavior is normal. Judgment and thought content normal.   No results found for this or any previous visit (from the past 72 hour(s)).  PHQ2/9: Depression screen [facility] 2/9 12/01/2018 08/28/2018 08/29/2017  Decreased Interest 1 0 3  Down, Depressed, Hopeless 1 0 3  PHQ - 2 Score 2 0 6  Altered sleeping 3 0 3  Tired, decreased energy 3 0 3  Change in appetite 3 0 3  Feeling bad or failure about yourself  2 0 2  Trouble concentrating 3 0 2  Moving slowly or fidgety/restless 1 0 3  Suicidal thoughts 0 0 0  PHQ-9 Score 17 0 22  Difficult doing work/chores Somewhat difficult Not difficult at all Very difficult   PHQ-2/9 Result is positive.    Fall Risk: Fall Risk  12/01/2018 08/28/2018  Falls in the past year? 0 0  Number falls in past yr: 0 0  Injury with Fall? 0 0  Follow up Falls evaluation completed -    Assessment & Plan 1. Anxiety and depression - venlafaxine XR (EFFEXOR XR) 37.5 MG 24 hr capsule; Take 1 tablet once daily for 2 weeks, then increase to 2 tablets once daily.  Dispense: 60 capsule; Refill: 1 - busPIRone (BUSPAR) 5 MG tablet; Take 1 tablet once daily at night for 7 days, then increase to 1 tablet twice daily.  Dispense: 60 tablet; Refill: 1  2. Constipation, unspecified constipation type - Doing well on Amitiza, symptoms are much improved.  3. Decreased urination - Doing better now that constipation has improved; following up with Dr. Tiburcio Pea in 4 months.   4. Chronic right-sided low back pain with bilateral sciatica - Will work on strengthening back and core.  Ibuprofen PRN; muscle relaxers ineffective for her.   I discussed the assessment and treatment plan with the patient. The patient was provided an opportunity to ask questions and all were answered. The patient agreed with the plan and demonstrated an understanding of  the instructions.  The patient was advised to call  back or seek an in-person evaluation if the symptoms worsen or if the condition fails to improve as anticipated.  I provided 23 minutes of non-face-to-face time during this encounter.

## 2018-12-04 ENCOUNTER — Other Ambulatory Visit: Payer: Self-pay | Admitting: Obstetrics and Gynecology

## 2018-12-04 DIAGNOSIS — O09522 Supervision of elderly multigravida, second trimester: Secondary | ICD-10-CM

## 2018-12-16 ENCOUNTER — Ambulatory Visit (INDEPENDENT_AMBULATORY_CARE_PROVIDER_SITE_OTHER): Payer: Medicaid Other | Admitting: Obstetrics & Gynecology

## 2018-12-16 ENCOUNTER — Other Ambulatory Visit: Payer: Self-pay

## 2018-12-16 ENCOUNTER — Encounter: Payer: Self-pay | Admitting: Obstetrics & Gynecology

## 2018-12-16 VITALS — Ht 66.0 in | Wt 202.0 lb

## 2018-12-16 DIAGNOSIS — E669 Obesity, unspecified: Secondary | ICD-10-CM

## 2018-12-16 DIAGNOSIS — Z6832 Body mass index (BMI) 32.0-32.9, adult: Secondary | ICD-10-CM | POA: Diagnosis not present

## 2018-12-16 MED ORDER — DIETHYLPROPION HCL ER 75 MG PO TB24: 1.0000 | ORAL_TABLET | Freq: Every day | ORAL | 0 refills | Status: DC

## 2018-12-16 NOTE — Progress Notes (Signed)
Virtual Visit via Telephone Note  I connected with Kristen Mcpherson on 12/16/18 at  4:10 PM EST by telephone and verified that I am speaking with the correct person using two identifiers.   I discussed the limitations, risks, security and privacy concerns of performing an evaluation and management service by telephone and the availability of in person appointments. I also discussed with the patient that there may be a patient responsible charge related to this service. The patient expressed understanding and agreed to proceed. She was at home and I was in my office.  History of Present Illness: Patient is a 38 y.o. [id number] presenting for evaluation of abnormal weight gain.  The patient has gained 20 pounds over the past 6 mos..  She feels she has gained weight due to problems with her nutrition and physical activity.  She has these associated symptoms: arthralgia, fatigue and joint pain.  She has tried prescription appetite suppressants: (Phentermine several months ago, since has stopped) and self-directed dieting in the past with no success. PMHx: She  has a past medical history of AMA (advanced maternal age) multigravida 35+, second trimester (08/29/2017), Anemia, Anxiety, Asthma, Breech presentation delivered (03/20/2018), Cesarean delivery indicated due to breech presentation (04/03/2018), High-risk pregnancy, third trimester (08/29/2017), and Iron deficiency anemia (01/08/2018). Also,  has a past surgical history that includes Appendectomy; Cesarean section (N/A, 03/20/2018); Tubal ligation (Bilateral, 03/20/2018); and Cervical conization w/bx (N/A, 06/12/2018)., family history includes ADD / ADHD in her brother; Anemia in her mother; Anxiety disorder in her mother; Arthritis in her mother; Dementia in her maternal grandfather; Depression in her father and mother; Diabetes in her maternal grandmother and mother; Hyperlipidemia in her father; Hypertension in her father; Liver disease in her maternal  grandmother; Restless legs syndrome in her mother; Rheum arthritis in her maternal grandfather; Stroke in her mother.,  reports that she has been smoking cigarettes. She started smoking about 18 years ago. She has been smoking about 0.50 packs per day. She has never used smokeless tobacco. She reports current alcohol use. She reports that she does not use drugs.  She has a current medication list which includes the following prescription(s): buspirone, docusate sodium, ibuprofen, levonorgestrel, lubiprostone, venlafaxine xr, and diethylpropion hcl cr. Also, has No Known Allergies.  Review of Systems  All other systems reviewed and are negative.    Observations/Objective: No exam today, due to telephone eVisit due to [facility] virus restriction on elective visits and procedures.  Prior visits reviewed along with ultrasounds/labs as indicated. Reported VS: Ht 5\' 6"  (1.676 m)   Wt 202 lb (91.6 kg)   BMI 32.60 kg/m   Assessment and Plan:   ICD-10-CM   1. Obesity (BMI 30-39.9)  E66.9    Plan: Will assist patient in incorporating positive experiences into her life to promote a positive mental attitude.  Education given regarding appropriate lifestyle changes for weight loss, including regular physical activity, healthy coping strategies, caloric restriction, and healthy eating patterns.  Patient is started on prescription appetite suppressants: TENUATE as an alternative to Phentermine; Saxenda also discussed as an option.    The risks and benefits as well as side effects of medication, such as Phenteramine or Tenuate, is discussed.  The pros and cons of suppressing appetite and boosting metabolism is counseled.  Risks of tolerance and addiction discussed.  Use of medicine will be short term.  Pt to call with any negative side effects and agrees to keep follow up appointments.   Follow Up Instructions: 1 month  I discussed the assessment and treatment plan with the patient. The patient was  provided an opportunity to ask questions and all were answered. The patient agreed with the plan and demonstrated an understanding of the instructions.   The patient was advised to call back or seek an in-person evaluation if the symptoms worsen or if the condition fails to improve as anticipated.  I provided 12 minutes of non-face-to-face time during this encounter.   Hoyt Koch, MD

## 2019-01-11 ENCOUNTER — Other Ambulatory Visit: Payer: Self-pay | Admitting: Family Medicine

## 2019-01-11 DIAGNOSIS — K5900 Constipation, unspecified: Secondary | ICD-10-CM

## 2019-01-13 ENCOUNTER — Other Ambulatory Visit: Payer: Self-pay

## 2019-01-13 ENCOUNTER — Encounter: Payer: Self-pay | Admitting: Obstetrics & Gynecology

## 2019-01-13 ENCOUNTER — Ambulatory Visit (INDEPENDENT_AMBULATORY_CARE_PROVIDER_SITE_OTHER): Payer: Medicaid Other | Admitting: Obstetrics & Gynecology

## 2019-01-13 VITALS — Ht 66.0 in | Wt 205.0 lb

## 2019-01-13 DIAGNOSIS — Z131 Encounter for screening for diabetes mellitus: Secondary | ICD-10-CM

## 2019-01-13 DIAGNOSIS — R5382 Chronic fatigue, unspecified: Secondary | ICD-10-CM

## 2019-01-13 DIAGNOSIS — Z13220 Encounter for screening for lipoid disorders: Secondary | ICD-10-CM

## 2019-01-13 DIAGNOSIS — E669 Obesity, unspecified: Secondary | ICD-10-CM | POA: Diagnosis not present

## 2019-01-13 MED ORDER — DIETHYLPROPION HCL ER 75 MG PO TB24: 1.0000 | ORAL_TABLET | Freq: Every day | ORAL | 1 refills | Status: DC

## 2019-01-13 NOTE — Progress Notes (Signed)
Virtual Visit via Telephone Note  I connected with Kristen Mcpherson on 01/13/19 at  1:30 PM EST by telephone and verified that I am speaking with the correct person using two identifiers.   I discussed the limitations, risks, security and privacy concerns of performing an evaluation and management service by telephone and the availability of in person appointments. I also discussed with the patient that there may be a patient responsible charge related to this service. The patient expressed understanding and agreed to proceed. She was at home and I was in my office.  History of Present Illness:  Kristen Mcpherson is a 39 y.o. who was started on Tenuate approximately 1 month ago due to obesity/abnormal weight gain. The patient has lost no pounds over the past month due to any of the lifestyle and medication measures she has tried and she reports significant dietary changes and daily exercise patterns;. She has tried Phentermine in the past as well..   She has these side effects: none.  PMHx: She  has a past medical history of AMA (advanced maternal age) multigravida 35+, second trimester (08/29/2017), Anemia, Anxiety, Asthma, Breech presentation delivered (03/20/2018), Cesarean delivery indicated due to breech presentation (04/03/2018), High-risk pregnancy, third trimester (08/29/2017), and Iron deficiency anemia (01/08/2018). Also,  has a past surgical history that includes Appendectomy; Cesarean section (N/A, 03/20/2018); Tubal ligation (Bilateral, 03/20/2018); and Cervical conization w/bx (N/A, 06/12/2018)., family history includes ADD / ADHD in her brother; Anemia in her mother; Anxiety disorder in her mother; Arthritis in her mother; Dementia in her maternal grandfather; Depression in her father and mother; Diabetes in her maternal grandmother and mother; Hyperlipidemia in her father; Hypertension in her father; Liver disease in her maternal grandmother; Restless legs syndrome in her mother; Rheum arthritis in  her maternal grandfather; Stroke in her mother.,  reports that she has been smoking cigarettes. She started smoking about 19 years ago. She has been smoking about 0.50 packs per day. She has never used smokeless tobacco. She reports current alcohol use. She reports that she does not use drugs.  She has a current medication list which includes the following prescription(s): buspirone, diethylpropion hcl cr, docusate sodium, ibuprofen, levonorgestrel, lubiprostone, and venlafaxine xr. Also, has No Known Allergies.  Review of Systems  All other systems reviewed and are negative.    Observations/Objective: No exam today, due to telephone eVisit due to [facility] virus restriction on elective visits and procedures.  Prior visits reviewed along with ultrasounds/labs as indicated. Reported: Ht 5\' 6"  (1.676 m)   Wt 205 lb (93 kg)   BMI 33.09 kg/m   Assessment and Plan:   ICD-10-CM   1. Chronic fatigue  R53.82 Vitamin B12    VITAMIN D 25 Hydroxy (Vit-D Deficiency, Fractures)    TSH  2. Screening cholesterol level  Z13.220 Fasting Lipid panel  3. Screening for diabetes mellitus  Z13.1 Glucose, fasting  4. Obesity (BMI 30-39.9)  E66.9    Plan: Patient is continued/added to prescription appetite suppressants: Tenuate, self-directed dieting and exercise; also will check labs at this time.   Will continue to assist patient in incorporating positive experiences into her life to promote a positive mental attitude.  Education given regarding appropriate lifestyle changes for weight loss, including regular physical activity, healthy coping strategies, caloric restriction, and healthy eating patterns.  The risks and benefits as well as side effects of medication, such as Phenteramine or Tenuate, is discussed.  The pros and cons of suppressing appetite and boosting metabolism is counseled.  Risks  of tolerance and addiction discussed.  Use of medicine will be short term.  Pt to call with any negative side  effects and agrees to keep follow up appointments.  Follow Up Instructions: 2 mos Also w lab results   I discussed the assessment and treatment plan with the patient. The patient was provided an opportunity to ask questions and all were answered. The patient agreed with the plan and demonstrated an understanding of the instructions.   The patient was advised to call back or seek an in-person evaluation if the symptoms worsen or if the condition fails to improve as anticipated.  I provided 12 minutes of non-face-to-face time during this encounter.   Letitia Libra, MD

## 2019-01-16 ENCOUNTER — Other Ambulatory Visit: Payer: Medicaid Other

## 2019-01-16 ENCOUNTER — Other Ambulatory Visit: Payer: Self-pay

## 2019-01-16 DIAGNOSIS — Z13220 Encounter for screening for lipoid disorders: Secondary | ICD-10-CM | POA: Diagnosis not present

## 2019-01-16 DIAGNOSIS — R5382 Chronic fatigue, unspecified: Secondary | ICD-10-CM

## 2019-01-16 DIAGNOSIS — Z131 Encounter for screening for diabetes mellitus: Secondary | ICD-10-CM | POA: Diagnosis not present

## 2019-01-17 LAB — LIPID PANEL
Chol/HDL Ratio: 3.9 ratio (ref 0.0–4.4)
Cholesterol, Total: 178 mg/dL (ref 100–199)
HDL: 46 mg/dL (ref >39)
LDL Chol Calc (NIH): 104 mg/dL — ABNORMAL HIGH (ref 0–99)
Triglycerides: 162 mg/dL — ABNORMAL HIGH (ref 0–149)
VLDL Cholesterol Cal: 28 mg/dL (ref 5–40)

## 2019-01-17 LAB — VITAMIN B12: Vitamin B-12: 428 pg/mL (ref 232–1245)

## 2019-01-17 LAB — GLUCOSE, FASTING: Glucose, Plasma: 92 mg/dL (ref 65–99)

## 2019-01-17 LAB — TSH: TSH: 1.47 u[IU]/mL (ref 0.450–4.500)

## 2019-01-27 ENCOUNTER — Encounter: Payer: Self-pay | Admitting: Family Medicine

## 2019-01-27 ENCOUNTER — Other Ambulatory Visit: Payer: Self-pay

## 2019-01-27 ENCOUNTER — Ambulatory Visit: Payer: Medicaid Other | Admitting: Family Medicine

## 2019-01-27 VITALS — BP 120/80 | HR 70 | Temp 97.3°F | Resp 16 | Ht 66.0 in | Wt 206.1 lb

## 2019-01-27 DIAGNOSIS — F32A Depression, unspecified: Secondary | ICD-10-CM

## 2019-01-27 DIAGNOSIS — F419 Anxiety disorder, unspecified: Secondary | ICD-10-CM

## 2019-01-27 DIAGNOSIS — L02213 Cutaneous abscess of chest wall: Secondary | ICD-10-CM

## 2019-01-27 DIAGNOSIS — F329 Major depressive disorder, single episode, unspecified: Secondary | ICD-10-CM

## 2019-01-27 MED ORDER — BUSPIRONE HCL 10 MG PO TABS: 10.0000 mg | ORAL_TABLET | Freq: Two times a day (BID) | ORAL | 1 refills | Status: DC

## 2019-01-27 NOTE — Progress Notes (Signed)
Name: Kristen Mcpherson   MRN: [id number]    DOB: Jun 30, 1980   Date:01/27/2019       Progress Note  Subjective  Chief Complaint  Chief Complaint  Patient presents with  . Cyst    between breast, painful. Red streaks doem left breast    HPI  Pt presents with concern for 1 week of abscess to the central chest that has become sore, tender, erythematous, with some red streaking over to the left breast. Denies fevers/chills, malaise.  She is not currently breastfeeding.  Anxiety: Anxiety has overall improved since taking low dose effexor and buspar, however she feels that an increase in her buspar would benefit her sleep and improve her anxiety. We discussed dose change today, wants to remain on BID dosing, so we will increase to 10mg  BID.  She will return in 6-8 weeks for follow up.    Depression screen [facility] 2/9 01/27/2019 12/01/2018 08/28/2018 08/29/2017  Decreased Interest 0 1 0 3  Down, Depressed, Hopeless 0 1 0 3  PHQ - 2 Score 0 2 0 6  Altered sleeping 1 3 0 3  Tired, decreased energy 1 3 0 3  Change in appetite 0 3 0 3  Feeling bad or failure about yourself  0 2 0 2  Trouble concentrating 0 3 0 2  Moving slowly or fidgety/restless 0 1 0 3  Suicidal thoughts 0 0 0 0  PHQ-9 Score 2 17 0 22  Difficult doing work/chores Not difficult at all Somewhat difficult Not difficult at all Very difficult     Patient Active Problem List   Diagnosis Date Noted  . CIN III (cervical intraepithelial neoplasia grade III) with severe dysplasia 06/05/2018  . Normal labor and delivery 03/20/2018  . Positive GBS test 03/04/2018  . Iron deficiency anemia 01/08/2018  . Hypotension 01/03/2018  . Abdominal pain affecting pregnancy, antepartum 12/06/2017  . 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 HSIL on Pap smear of cervix 10/07/2017  . History of gestational diabetes in prior pregnancy, currently pregnant in third trimester 08/29/2017  . Anxiety and depression 08/29/2017    Social History    Tobacco Use  . Smoking status: Current Every Day Smoker    Packs/day: 0.50    Types: Cigarettes    Start date: 01/02/2000  . Smokeless tobacco: Never Used  Substance Use Topics  . Alcohol use: Yes    Comment: once in a blue moon     Current Outpatient Medications:  .  busPIRone (BUSPAR) 5 MG tablet, Take 1 tablet once daily at night for 7 days, then increase to 1 tablet twice daily., Disp: 60 tablet, Rfl: 1 .  Diethylpropion HCl CR 75 MG TB24, Take 1 tablet (75 mg total) by mouth daily before breakfast., Disp: 30 tablet, Rfl: 1 .  docusate sodium (COLACE) 100 MG capsule, TAKE 1 CAPSULE BY MOUTH TWICE A DAY, Disp: 60 capsule, Rfl: 1 .  ibuprofen (ADVIL) 200 MG tablet, Take 200 mg by mouth every 6 (six) hours as needed., Disp: , Rfl:  .  levonorgestrel (MIRENA) 20 MCG/24HR IUD, 1 each by Intrauterine route once., Disp: , Rfl:  .  lubiprostone (AMITIZA) 8 MCG capsule, Take 1 capsule (8 mcg total) by mouth 2 (two) times daily with a meal., Disp: 180 capsule, Rfl: 1 .  venlafaxine XR (EFFEXOR XR) 37.5 MG 24 hr capsule, Take 1 tablet once daily for 2 weeks, then increase to 2 tablets once daily., Disp: 60 capsule, Rfl: 1  No Known Allergies  I personally reviewed active problem list, medication list, allergies, notes from last encounter, lab results with the patient/caregiver today.  ROS  Ten systems reviewed and is negative except as mentioned in HPI  Objective  Vitals:   01/27/19 1340  BP: 120/80  Pulse: 70  Resp: 16  Temp: (!) 97.3 F (36.3 C)  TempSrc: Temporal  SpO2: 94%  Weight: 206 lb 1.6 oz (93.5 kg)  Height: 5\' 6"  (1.676 m)    Body mass index is 33.27 kg/m.  Nursing Note and Vital Signs reviewed.  Physical Exam  Constitutional: Patient appears well-developed and well-nourished. No distress.  HENT: Head: Normocephalic and atraumatic.  Eyes: Conjunctivae and EOM are normal. No scleral icterus.  Neck: Normal range of motion. Neck supple. No JVD present.   Cardiovascular: Normal rate, regular rhythm and normal heart sounds.  No murmur heard. No BLE edema. Pulmonary/Chest: Effort normal and breath sounds normal. No respiratory distress. Musculoskeletal: Normal range of motion, no joint effusions. No gross deformities Neurological: Pt is alert and oriented to person, place, and time. No cranial nerve deficit. Coordination, balance, strength, speech and gait are normal.  Skin: There is a cutaneous abscess to the central chest with moderate fluctuance, there is erythema with some streaking into the top of the left breast.  The area is exquisitely tender to palpation. Psychiatric: Patient has a normal mood and affect. behavior is normal. Judgment and thought content normal.  No results found for this or any previous visit (from the past 72 hour(s)).  Assessment & Plan  1. Cutaneous abscess of chest wall - Ambulatory referral to General Surgery - sent directly to Dr. for evaluation - concern for breast/chest wall abscess possibly requiring I&D.   2. Anxiety and depression - busPIRone (BUSPAR) 10 MG tablet; Take 1 tablet (10 mg total) by mouth 2 (two) times daily.  Dispense: 180 tablet; Refill: 1 - Continue low dose effexor.

## 2019-02-04 ENCOUNTER — Other Ambulatory Visit: Payer: Self-pay | Admitting: Family Medicine

## 2019-02-04 DIAGNOSIS — F329 Major depressive disorder, single episode, unspecified: Secondary | ICD-10-CM

## 2019-02-04 DIAGNOSIS — F32A Depression, unspecified: Secondary | ICD-10-CM

## 2019-02-04 DIAGNOSIS — F419 Anxiety disorder, unspecified: Secondary | ICD-10-CM

## 2019-03-12 ENCOUNTER — Other Ambulatory Visit (HOSPITAL_COMMUNITY)
Admission: RE | Admit: 2019-03-12 | Discharge: 2019-03-12 | Disposition: A | Discharge status: Home / Self Care | Payer: Medicaid Other | Source: Ambulatory Visit | Attending: Obstetrics & Gynecology | Admitting: Obstetrics & Gynecology

## 2019-03-12 ENCOUNTER — Other Ambulatory Visit: Payer: Self-pay

## 2019-03-12 ENCOUNTER — Encounter: Payer: Self-pay | Admitting: Obstetrics & Gynecology

## 2019-03-12 ENCOUNTER — Ambulatory Visit (INDEPENDENT_AMBULATORY_CARE_PROVIDER_SITE_OTHER): Payer: Medicaid Other | Admitting: Obstetrics & Gynecology

## 2019-03-12 VITALS — BP 120/80 | Ht 66.0 in | Wt 213.0 lb

## 2019-03-12 DIAGNOSIS — N925 Other specified irregular menstruation: Secondary | ICD-10-CM

## 2019-03-12 DIAGNOSIS — Z6834 Body mass index (BMI) 34.0-34.9, adult: Secondary | ICD-10-CM

## 2019-03-12 DIAGNOSIS — D069 Carcinoma in situ of cervix, unspecified: Secondary | ICD-10-CM

## 2019-03-12 DIAGNOSIS — Z0142 Encounter for cervical smear to confirm findings of recent normal smear following initial abnormal smear: Secondary | ICD-10-CM | POA: Diagnosis not present

## 2019-03-12 DIAGNOSIS — E669 Obesity, unspecified: Secondary | ICD-10-CM | POA: Diagnosis not present

## 2019-03-12 DIAGNOSIS — N946 Dysmenorrhea, unspecified: Secondary | ICD-10-CM

## 2019-03-12 NOTE — Progress Notes (Signed)
HPI:  Patient is a 39 y.o. [id number] presenting for follow up evaluation of abnormal PAP smear in the past.  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 She has had a prior colposcopy. 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   Has been taking Tenuate but not seeing effects Has in fact gained weigth since last visit  PMHx: She  has a past medical history of AMA (advanced maternal age) multigravida 37+, second trimester (08/29/2017), Anemia, Anxiety, Asthma, Breech presentation delivered (03/20/2018), Cesarean delivery indicated due to breech presentation (04/03/2018), High-risk pregnancy, third trimester (08/29/2017), and Iron deficiency anemia (01/08/2018). Also,  has a past surgical history that includes Appendectomy; Cesarean section (N/A, 03/20/2018); Tubal ligation (Bilateral, 03/20/2018); and Cervical conization w/bx (N/A, 06/12/2018)., family history includes ADD / ADHD in her brother; Anemia in her mother; Anxiety disorder in her mother; Arthritis in her mother; Dementia in her maternal grandfather; Depression in her father and mother; Diabetes in her maternal grandmother and mother; Hyperlipidemia in her father; Hypertension in her father; Liver disease in her maternal grandmother; Restless legs syndrome in her mother; Rheum arthritis in her maternal grandfather; Stroke in her mother.,  reports that she has been smoking cigarettes. She started smoking about 19 years ago. She has been smoking about 0.50 packs per day. She has never used smokeless tobacco. She reports current alcohol use. She reports that she does not use drugs.  She has a current medication list which includes the following prescription(s): buspirone, diethylpropion hcl cr, docusate sodium, ibuprofen, levonorgestrel, lubiprostone, and venlafaxine xr. Also, has No Known Allergies.  Review of Systems  All other systems reviewed and are  negative.   Objective: BP 120/80   Ht 5\' 6"  (1.676 m)   Wt 213 lb (96.6 kg)   BMI 34.38 kg/m  Filed Weights   03/12/19 1532  Weight: 213 lb (96.6 kg)   Body mass index is 34.38 kg/m.  Physical examination Physical Exam Constitutional:      General: She is not in acute distress.    Appearance: She is well-developed.  Genitourinary:     Pelvic exam was performed with patient supine.     Vagina and uterus normal.     No vaginal erythema or bleeding.     No cervical motion tenderness, discharge, polyp or nabothian cyst.     Uterus is mobile.     Uterus is not enlarged.     No uterine mass detected.    Uterus is midaxial.     No right or left adnexal mass present.     Right adnexa not tender.     Left adnexa not tender.  HENT:     Head: Normocephalic and atraumatic.     Nose: Nose normal.  Abdominal:     General: There is no distension.     Palpations: Abdomen is soft.     Tenderness: There is no abdominal tenderness.  Musculoskeletal:        General: Normal range of motion.  Neurological:     Mental Status: She is alert and oriented to person, place, and time.     Cranial Nerves: No cranial nerve deficit.  Skin:    General: Skin is warm and dry.  Psychiatric:        Attention and Perception: Attention normal.        Mood and Affect: Mood and affect normal.  Speech: Speech normal.        Behavior: Behavior normal.        Thought Content: Thought content normal.        Judgment: Judgment normal.     ASSESSMENT:  History of Cervical Dysplasia  Plan:  1.  I discussed the grading system of pap smears and HPV high risk viral types.   2. Follow up PAP 6 months, vs intervention if high grade dysplasia identified.  3. Weight gain, dificulties w obesity.  Plan Holiday from any weight loss meds to allow body to re-set or be more receptive in future attempts.  Cont exercise and diet.  May also be due to meds she is on, slowing metabolism and allowing for weight  gain.  4. Monitor irreg cycles and pain.  No more pregnancies desired.  A total of 20 minutes were spent face-to-face with the patient as well as preparation, review, communication, and documentation during this encounter.    Barnett Applebaum, MD, [facility], [facility] 03/12/2019  3:36 PM

## 2019-03-16 LAB — CYTOLOGY - PAP: Diagnosis: NEGATIVE

## 2019-05-11 ENCOUNTER — Ambulatory Visit (INDEPENDENT_AMBULATORY_CARE_PROVIDER_SITE_OTHER): Payer: Medicaid Other | Admitting: Obstetrics & Gynecology

## 2019-05-11 ENCOUNTER — Encounter: Payer: Self-pay | Admitting: Obstetrics & Gynecology

## 2019-05-11 ENCOUNTER — Other Ambulatory Visit: Payer: Self-pay

## 2019-05-11 VITALS — Ht 66.0 in | Wt 214.0 lb

## 2019-05-11 DIAGNOSIS — E669 Obesity, unspecified: Secondary | ICD-10-CM

## 2019-05-11 MED ORDER — PHENTERMINE HCL 15 MG PO CAPS: 15.0000 mg | ORAL_CAPSULE | Freq: Two times a day (BID) | ORAL | 1 refills | Status: DC

## 2019-05-11 NOTE — Progress Notes (Signed)
Tele Visit  I connected with Kristen Mcpherson on 05/11/19 at  4:30 PM EDT by a telemedicine application and verified that I am speaking with the correct person using two identifiers.  Location: Patient: Home Provider: Office   I discussed the limitations of evaluation and management by telemedicine and the availability of in person appointments. The patient expressed understanding and agreed to proceed.  History of Present Illness: Kristen Mcpherson is a 39 y.o. who was started on Phentermine in thje past and has been off for two mos on holiday, and neither gained nor lost during this time.  Feels she needs meds to help lose weight.  Did not do well w Topamax added in to regimen.  Request "phentermatrazin" to take w Phentermine.  Has done hCG shots in past that helped.  PMHx: She  has a past medical history of AMA (advanced maternal age) multigravida 37+, second trimester (08/29/2017), Anemia, Anxiety, Asthma, Breech presentation delivered (03/20/2018), Cesarean delivery indicated due to breech presentation (04/03/2018), High-risk pregnancy, third trimester (08/29/2017), and Iron deficiency anemia (01/08/2018). Also,  has a past surgical history that includes Appendectomy; Cesarean section (N/A, 03/20/2018); Tubal ligation (Bilateral, 03/20/2018); and Cervical conization w/bx (N/A, 06/12/2018)., family history includes ADD / ADHD in her brother; Anemia in her mother; Anxiety disorder in her mother; Arthritis in her mother; Dementia in her maternal grandfather; Depression in her father and mother; Diabetes in her maternal grandmother and mother; Hyperlipidemia in her father; Hypertension in her father; Liver disease in her maternal grandmother; Restless legs syndrome in her mother; Rheum arthritis in her maternal grandfather; Stroke in her mother.,  reports that she has been smoking cigarettes. She started smoking about 19 years ago. She has been smoking about 0.50 packs per day. She has never used smokeless  tobacco. She reports current alcohol use. She reports that she does not use drugs.  She has a current medication list which includes the following prescription(s): buspirone, docusate sodium, ibuprofen, levonorgestrel, lubiprostone, venlafaxine xr, and phentermine. Also, has No Known Allergies.  Review of Systems  All other systems reviewed and are negative.     Observations/Objective: No exam today, due to telephone eVisit due to [facility] virus restriction on elective visits and procedures.  Prior visits reviewed along with ultrasounds/labs as indicated. From Home: Ht 5\' 6"  (1.676 m)   Wt 214 lb (97.1 kg)   BMI 34.54 kg/m  Body mass index is 34.54 kg/m.  Filed Weights   05/11/19 1558  Weight: 214 lb (97.1 kg)    Assessment and Plan:   ICD-10-CM   1. Obesity (BMI 30-39.9)  E66.9     Assessment: obesity  Plan: Patient is restarted to prescription appetite suppressants: Phentermine.  I do not know the other medicine requested by the patient, and will not Rx at this time that one.  She may consider bariatric center for meds including hCG injections..   Will continue to assist patient in incorporating positive experiences into her life to promote a positive mental attitude.  Education given regarding appropriate lifestyle changes for weight loss, including regular physical activity, healthy coping strategies, caloric restriction, and healthy eating patterns.  The risks and benefits as well as side effects of medication, such as Phenteramine or Tenuate, is discussed.  The pros and cons of suppressing appetite and boosting metabolism is counseled.  Risks of tolerance and addiction discussed.  Use of medicine will be short term.  Pt to call with any negative side effects and agrees to keep follow up appointments.  Follow Up Instructions: 2 mos   I discussed the assessment and treatment plan with the patient. The patient was provided an opportunity to ask questions and all were answered.  The patient agreed with the plan and demonstrated an understanding of the instructions.   The patient was advised to call back or seek an in-person evaluation if the symptoms worsen or if the condition fails to improve as anticipated.  A total of 15 minutes were spent face-to-face with the patient as well as preparation, review, communication, and documentation during this encounter.   Barnett Applebaum, MD, [facility], [facility] 05/11/2019  4:45 PM

## 2019-05-12 ENCOUNTER — Telehealth: Payer: Self-pay | Admitting: Obstetrics & Gynecology

## 2019-05-12 NOTE — Telephone Encounter (Signed)
-----   Message from Nadara Mustard, MD sent at 05/11/2019  4:47 PM EDT ----- Regarding: Tele appt 2 mos

## 2019-05-12 NOTE — Telephone Encounter (Signed)
Patient is schedule for 07/15/19 with [facility]

## 2019-05-14 ENCOUNTER — Telehealth: Payer: Self-pay

## 2019-05-14 NOTE — Telephone Encounter (Signed)
Reviewed chart/last visit note and prior phentermine rx. Advised patient she was previously on Phentermine (Adipex) 37.5mg  qd (pill). Dr. Tiburcio Pea prescribed Phentermine 15mg  capsule bid this time. Patient understands and will take meds as prescribed.

## 2019-05-14 NOTE — Telephone Encounter (Signed)
Patient inquiring about the phentermine. Normally the pills are white with one dot, but the medicine she picked up yesterday from the pharmacy is gray & yellow capsules. She's never taken it in this form before. She would like a return call about this [phone]

## 2019-06-15 ENCOUNTER — Telehealth: Payer: Self-pay | Admitting: Obstetrics & Gynecology

## 2019-06-15 ENCOUNTER — Other Ambulatory Visit: Payer: Self-pay | Admitting: Obstetrics & Gynecology

## 2019-06-15 MED ORDER — PHENTERMINE HCL 15 MG PO CAPS: 15.0000 mg | ORAL_CAPSULE | Freq: Two times a day (BID) | ORAL | 0 refills | Status: DC

## 2019-06-15 NOTE — Telephone Encounter (Signed)
We had a cancellation for 06/17/19 at 9:40. Patient is scheduled for an phone visit with [facility]

## 2019-06-15 NOTE — Telephone Encounter (Signed)
Patient is calling to follow up on her scheduled appointment which in error was schedule two months out not one month. Patient is rescheduled to 07/02/19 for next available appointment. Patient is asking if a refil can be sent in due to the scheduling error not being her fault. Please advise

## 2019-06-17 ENCOUNTER — Encounter: Payer: Self-pay | Admitting: Obstetrics & Gynecology

## 2019-06-17 ENCOUNTER — Ambulatory Visit (INDEPENDENT_AMBULATORY_CARE_PROVIDER_SITE_OTHER): Payer: Medicaid Other | Admitting: Obstetrics & Gynecology

## 2019-06-17 ENCOUNTER — Other Ambulatory Visit: Payer: Self-pay

## 2019-06-17 DIAGNOSIS — E669 Obesity, unspecified: Secondary | ICD-10-CM | POA: Diagnosis not present

## 2019-06-17 MED ORDER — PHENTERMINE HCL 15 MG PO CAPS: 15.0000 mg | ORAL_CAPSULE | Freq: Two times a day (BID) | ORAL | 1 refills | Status: DC

## 2019-06-17 NOTE — Progress Notes (Addendum)
Virtual Visit via Video Note  I connected with Kristen Mcpherson on 06/17/19 at  9:40 AM EDT by a video enabled telemedicine application and verified that I am speaking with the correct person using two identifiers.  Location: Patient: Home Provider: Office   I discussed the limitations of evaluation and management by telemedicine and the availability of in person appointments. The patient expressed understanding and agreed to proceed.  History of Present Illness: Kristen Mcpherson is a 39 y.o. who was started on Phentermine approximately 1 month ago due to obesity/abnormal weight gain. The patient has lost 7 pounds over the past month due to meds..   She has these side effects: none.  PMHx: She  has a past medical history of AMA (advanced maternal age) multigravida 37+, second trimester (08/29/2017), Anemia, Anxiety, Asthma, Breech presentation delivered (03/20/2018), Cesarean delivery indicated due to breech presentation (04/03/2018), High-risk pregnancy, third trimester (08/29/2017), and Iron deficiency anemia (01/08/2018). Also,  has a past surgical history that includes Appendectomy; Cesarean section (N/A, 03/20/2018); Tubal ligation (Bilateral, 03/20/2018); and Cervical conization w/bx (N/A, 06/12/2018)., family history includes ADD / ADHD in her brother; Anemia in her mother; Anxiety disorder in her mother; Arthritis in her mother; Dementia in her maternal grandfather; Depression in her father and mother; Diabetes in her maternal grandmother and mother; Hyperlipidemia in her father; Hypertension in her father; Liver disease in her maternal grandmother; Restless legs syndrome in her mother; Rheum arthritis in her maternal grandfather; Stroke in her mother.,  reports that she has been smoking cigarettes. She started smoking about 19 years ago. She has been smoking about 0.50 packs per day. She has never used smokeless tobacco. She reports current alcohol use. She reports that she does not use drugs.  She  has a current medication list which includes the following prescription(s): buspirone, docusate sodium, ibuprofen, levonorgestrel, lubiprostone, phentermine, and venlafaxine xr. Also, has No Known Allergies.  Review of Systems  All other systems reviewed and are negative.     Observations/Objective: No exam today, due to telephone eVisit due to [facility] virus restriction on elective visits and procedures.  Prior visits reviewed along with ultrasounds/labs as indicated. From Home: There were no vitals taken for this visit. There is no height or weight on file to calculate BMI. There were no vitals filed for this visit.  Assessment and Plan:   ICD-10-CM   1. Obesity (BMI 30-39.9)  E66.9 phentermine 15 MG capsule    Assessment: obesity Medication treatment is going well for her.  Plan: Patient is continued/added to diet, prescription appetite suppressants: Phentermine and Exercise.   Will continue to assist patient in incorporating positive experiences into her life to promote a positive mental attitude.  Education given regarding appropriate lifestyle changes for weight loss, including regular physical activity, healthy coping strategies, caloric restriction, and healthy eating patterns.  The risks and benefits as well as side effects of medication, such as Phenteramine or Tenuate, is discussed.  The pros and cons of suppressing appetite and boosting metabolism is counseled.  Risks of tolerance and addiction discussed.  Use of medicine will be short term.  Pt to call with any negative side effects and agrees to keep follow up appointments.  Follow Up Instructions: 2 mos   I discussed the assessment and treatment plan with the patient. The patient was provided an opportunity to ask questions and all were answered. The patient agreed with the plan and demonstrated an understanding of the instructions.   The patient was advised to call back  or seek an in-person evaluation if the symptoms  worsen or if the condition fails to improve as anticipated.  A total of 20 minutes were spent face-to-face with the patient as well as preparation, review, communication, and documentation during this encounter.   Barnett Applebaum, MD, [facility], [facility] 06/17/2019  9:53 AM

## 2019-06-29 ENCOUNTER — Other Ambulatory Visit: Payer: Self-pay | Admitting: Obstetrics & Gynecology

## 2019-07-02 ENCOUNTER — Ambulatory Visit: Payer: Medicaid Other | Admitting: Obstetrics & Gynecology

## 2019-07-15 ENCOUNTER — Ambulatory Visit: Payer: Medicaid Other | Admitting: Obstetrics & Gynecology

## 2019-08-11 ENCOUNTER — Encounter: Payer: Self-pay | Admitting: Obstetrics & Gynecology

## 2019-08-11 ENCOUNTER — Ambulatory Visit (INDEPENDENT_AMBULATORY_CARE_PROVIDER_SITE_OTHER): Payer: Medicaid Other | Admitting: Obstetrics & Gynecology

## 2019-08-11 ENCOUNTER — Other Ambulatory Visit: Payer: Self-pay

## 2019-08-11 DIAGNOSIS — E669 Obesity, unspecified: Secondary | ICD-10-CM

## 2019-08-11 MED ORDER — TOPIRAMATE 25 MG PO TABS: 25.0000 mg | ORAL_TABLET | Freq: Two times a day (BID) | ORAL | 2 refills | Status: DC

## 2019-08-11 MED ORDER — DIETHYLPROPION HCL ER 75 MG PO TB24: 1.0000 | ORAL_TABLET | Freq: Every day | ORAL | 1 refills | Status: DC

## 2019-08-11 NOTE — Progress Notes (Signed)
Virtual Visit via Video Note  I connected with Kristen Mcpherson on 08/11/19 at 10:40 AM EDT by a video enabled telemedicine application and verified that I am speaking with the correct person using two identifiers.  Location: Patient: Home Provider: Office   I discussed the limitations of evaluation and management by telemedicine and the availability of in person appointments. The patient expressed understanding and agreed to proceed.  History of Present Illness: Kristen Mcpherson is a 39 y.o. who was started on Phentermine approximately 4 months ago due to obesity/abnormal weight gain. The patient has lost 2 pounds over the past month due to meds, also diet and daily exercise..   She has these side effects: none.  PMHx: She  has a past medical history of AMA (advanced maternal age) multigravida 35+, second trimester (08/29/2017), Anemia, Anxiety, Asthma, Breech presentation delivered (03/20/2018), Cesarean delivery indicated due to breech presentation (04/03/2018), High-risk pregnancy, third trimester (08/29/2017), and Iron deficiency anemia (01/08/2018). Also,  has a past surgical history that includes Appendectomy; Cesarean section (N/A, 03/20/2018); Tubal ligation (Bilateral, 03/20/2018); and Cervical conization w/bx (N/A, 06/12/2018)., family history includes ADD / ADHD in her brother; Anemia in her mother; Anxiety disorder in her mother; Arthritis in her mother; Dementia in her maternal grandfather; Depression in her father and mother; Diabetes in her maternal grandmother and mother; Hyperlipidemia in her father; Hypertension in her father; Liver disease in her maternal grandmother; Restless legs syndrome in her mother; Rheum arthritis in her maternal grandfather; Stroke in her mother.,  reports that she has been smoking cigarettes. She started smoking about 19 years ago. She has been smoking about 0.50 packs per day. She has never used smokeless tobacco. She reports current alcohol use. She reports that  she does not use drugs.  She has a current medication list which includes the following prescription(s): buspirone, diethylpropion hcl cr, docusate sodium, ibuprofen, levonorgestrel, lubiprostone, phentermine, topiramate, and venlafaxine xr. Also, has No Known Allergies.  ROS    Observations/Objective: No exam today, due to telephone eVisit due to [facility] virus restriction on elective visits and procedures.  Prior visits reviewed along with ultrasounds/labs as indicated. From Home: Ht 5\' 6"  (1.676 m)   Wt 205 lb 3.2 oz (93.1 kg)   BMI 33.12 kg/m  Body mass index is 33.12 kg/m.  Filed Weights   08/11/19 1055  Weight: 205 lb 3.2 oz (93.1 kg)    Assessment and Plan:   ICD-10-CM   1. Obesity (BMI 30-39.9)  E66.9 topiramate (TOPAMAX) 25 MG tablet    Diethylpropion HCl CR 75 MG TB24     Assessment: obesity Medication treatment is less effective this past month.  Plan: Patient is continued/added to prescription appetite suppressants: CHANGE to TENUATE, and add TOPAMAX again.   Will continue to assist patient in incorporating positive experiences into her life to promote a positive mental attitude.  Education given regarding appropriate lifestyle changes for weight loss, including regular physical activity, healthy coping strategies, caloric restriction, and healthy eating patterns.  The risks and benefits as well as side effects of medication, such as Phenteramine or Tenuate, is discussed.  The pros and cons of suppressing appetite and boosting metabolism is counseled.  Risks of tolerance and addiction discussed.  Use of medicine will be short term.  Pt to call with any negative side effects and agrees to keep follow up appointments.  Follow Up Instructions: Yearly due in Mar 2022 Plan 2 mos of this medicine then holiday   I discussed the assessment and treatment  plan with the patient. The patient was provided an opportunity to ask questions and all were answered. The patient agreed  with the plan and demonstrated an understanding of the instructions.   The patient was advised to call back or seek an in-person evaluation if the symptoms worsen or if the condition fails to improve as anticipated.  A total of 15 minutes were spent face-to-face with the patient as well as preparation, review, communication, and documentation during this encounter.   Annamarie Major, MD, [facility], [facility] 08/11/2019  12:23 PM

## 2019-11-07 ENCOUNTER — Other Ambulatory Visit: Payer: Self-pay | Admitting: Obstetrics & Gynecology

## 2019-11-07 DIAGNOSIS — E669 Obesity, unspecified: Secondary | ICD-10-CM

## 2019-12-18 DIAGNOSIS — U071 COVID-19: Secondary | ICD-10-CM | POA: Diagnosis not present

## 2019-12-18 DIAGNOSIS — Z20822 Contact with and (suspected) exposure to covid-19: Secondary | ICD-10-CM | POA: Diagnosis not present

## 2019-12-18 DIAGNOSIS — Z03818 Encounter for observation for suspected exposure to other biological agents ruled out: Secondary | ICD-10-CM | POA: Diagnosis not present

## 2019-12-24 ENCOUNTER — Inpatient Hospital Stay
Admission: EM | Admit: 2019-12-24 | Discharge: 2019-12-29 | DRG: 177 | Disposition: A | Discharge status: Home / Self Care | Payer: Medicaid Other | Attending: Internal Medicine | Admitting: Internal Medicine

## 2019-12-24 ENCOUNTER — Other Ambulatory Visit: Payer: Self-pay

## 2019-12-24 ENCOUNTER — Emergency Department: Payer: Medicaid Other

## 2019-12-24 ENCOUNTER — Encounter: Payer: Self-pay | Admitting: Emergency Medicine

## 2019-12-24 DIAGNOSIS — E860 Dehydration: Secondary | ICD-10-CM | POA: Diagnosis present

## 2019-12-24 DIAGNOSIS — Z8349 Family history of other endocrine, nutritional and metabolic diseases: Secondary | ICD-10-CM

## 2019-12-24 DIAGNOSIS — Z6829 Body mass index (BMI) 29.0-29.9, adult: Secondary | ICD-10-CM | POA: Diagnosis not present

## 2019-12-24 DIAGNOSIS — Z823 Family history of stroke: Secondary | ICD-10-CM | POA: Diagnosis not present

## 2019-12-24 DIAGNOSIS — R000 Tachycardia, unspecified: Secondary | ICD-10-CM | POA: Diagnosis not present

## 2019-12-24 DIAGNOSIS — R001 Bradycardia, unspecified: Secondary | ICD-10-CM | POA: Diagnosis not present

## 2019-12-24 DIAGNOSIS — F32A Depression, unspecified: Secondary | ICD-10-CM | POA: Diagnosis not present

## 2019-12-24 DIAGNOSIS — J9601 Acute respiratory failure with hypoxia: Secondary | ICD-10-CM

## 2019-12-24 DIAGNOSIS — R079 Chest pain, unspecified: Secondary | ICD-10-CM | POA: Diagnosis not present

## 2019-12-24 DIAGNOSIS — Z8249 Family history of ischemic heart disease and other diseases of the circulatory system: Secondary | ICD-10-CM | POA: Diagnosis not present

## 2019-12-24 DIAGNOSIS — J90 Pleural effusion, not elsewhere classified: Secondary | ICD-10-CM | POA: Diagnosis not present

## 2019-12-24 DIAGNOSIS — J189 Pneumonia, unspecified organism: Secondary | ICD-10-CM | POA: Diagnosis not present

## 2019-12-24 DIAGNOSIS — R110 Nausea: Secondary | ICD-10-CM | POA: Diagnosis not present

## 2019-12-24 DIAGNOSIS — R1110 Vomiting, unspecified: Secondary | ICD-10-CM | POA: Diagnosis not present

## 2019-12-24 DIAGNOSIS — J45909 Unspecified asthma, uncomplicated: Secondary | ICD-10-CM | POA: Diagnosis present

## 2019-12-24 DIAGNOSIS — J1282 Pneumonia due to coronavirus disease 2019: Secondary | ICD-10-CM | POA: Diagnosis not present

## 2019-12-24 DIAGNOSIS — Z79899 Other long term (current) drug therapy: Secondary | ICD-10-CM

## 2019-12-24 DIAGNOSIS — F419 Anxiety disorder, unspecified: Secondary | ICD-10-CM | POA: Diagnosis present

## 2019-12-24 DIAGNOSIS — U071 COVID-19: Secondary | ICD-10-CM | POA: Diagnosis not present

## 2019-12-24 DIAGNOSIS — A0839 Other viral enteritis: Secondary | ICD-10-CM | POA: Diagnosis present

## 2019-12-24 DIAGNOSIS — F17210 Nicotine dependence, cigarettes, uncomplicated: Secondary | ICD-10-CM | POA: Diagnosis not present

## 2019-12-24 DIAGNOSIS — R0602 Shortness of breath: Secondary | ICD-10-CM

## 2019-12-24 DIAGNOSIS — Z793 Long term (current) use of hormonal contraceptives: Secondary | ICD-10-CM | POA: Diagnosis not present

## 2019-12-24 DIAGNOSIS — I959 Hypotension, unspecified: Secondary | ICD-10-CM | POA: Diagnosis not present

## 2019-12-24 DIAGNOSIS — Z8261 Family history of arthritis: Secondary | ICD-10-CM

## 2019-12-24 DIAGNOSIS — Z833 Family history of diabetes mellitus: Secondary | ICD-10-CM

## 2019-12-24 DIAGNOSIS — E663 Overweight: Secondary | ICD-10-CM | POA: Diagnosis present

## 2019-12-24 DIAGNOSIS — Z83438 Family history of other disorder of lipoprotein metabolism and other lipidemia: Secondary | ICD-10-CM | POA: Diagnosis not present

## 2019-12-24 DIAGNOSIS — R509 Fever, unspecified: Secondary | ICD-10-CM

## 2019-12-24 DIAGNOSIS — R059 Cough, unspecified: Secondary | ICD-10-CM | POA: Diagnosis not present

## 2019-12-24 DIAGNOSIS — R42 Dizziness and giddiness: Secondary | ICD-10-CM | POA: Diagnosis not present

## 2019-12-24 DIAGNOSIS — J9600 Acute respiratory failure, unspecified whether with hypoxia or hypercapnia: Secondary | ICD-10-CM | POA: Diagnosis not present

## 2019-12-24 LAB — CBC WITH DIFFERENTIAL/PLATELET
Abs Immature Granulocytes: 0.03 10*3/uL (ref 0.00–0.07)
Basophils Absolute: 0 10*3/uL (ref 0.0–0.1)
Basophils Relative: 0 %
Eosinophils Absolute: 0 10*3/uL (ref 0.0–0.5)
Eosinophils Relative: 0 %
HCT: 36.7 % (ref 36.0–46.0)
Hemoglobin: 12.4 g/dL (ref 12.0–15.0)
Immature Granulocytes: 1 %
Lymphocytes Relative: 10 %
Lymphs Abs: 0.6 10*3/uL — ABNORMAL LOW (ref 0.7–4.0)
MCH: 31.3 pg (ref 26.0–34.0)
MCHC: 33.8 g/dL (ref 30.0–36.0)
MCV: 92.7 fL (ref 80.0–100.0)
Monocytes Absolute: 0.2 10*3/uL (ref 0.1–1.0)
Monocytes Relative: 4 %
Neutro Abs: 4.9 10*3/uL (ref 1.7–7.7)
Neutrophils Relative %: 85 %
Platelets: 174 10*3/uL (ref 150–400)
RBC: 3.96 MIL/uL (ref 3.87–5.11)
RDW: 13.2 % (ref 11.5–15.5)
Smear Review: NORMAL
WBC: 5.7 10*3/uL (ref 4.0–10.5)
nRBC: 0 % (ref 0.0–0.2)

## 2019-12-24 LAB — COMPREHENSIVE METABOLIC PANEL
ALT: 33 U/L (ref 0–44)
AST: 42 U/L — ABNORMAL HIGH (ref 15–41)
Albumin: 3.2 g/dL — ABNORMAL LOW (ref 3.5–5.0)
Alkaline Phosphatase: 35 U/L — ABNORMAL LOW (ref 38–126)
Anion gap: 9 (ref 5–15)
BUN: 12 mg/dL (ref 6–20)
CO2: 25 mmol/L (ref 22–32)
Calcium: 8.2 mg/dL — ABNORMAL LOW (ref 8.9–10.3)
Chloride: 102 mmol/L (ref 98–111)
Creatinine, Ser: 0.6 mg/dL (ref 0.44–1.00)
GFR, Estimated: >60 mL/min (ref >60)
Glucose, Bld: 178 mg/dL — ABNORMAL HIGH (ref 70–99)
Potassium: 4.5 mmol/L (ref 3.5–5.1)
Sodium: 136 mmol/L (ref 135–145)
Total Bilirubin: 0.4 mg/dL (ref 0.3–1.2)
Total Protein: 6.5 g/dL (ref 6.5–8.1)

## 2019-12-24 LAB — TROPONIN I (HIGH SENSITIVITY): Troponin I (High Sensitivity): 4 ng/L (ref <18)

## 2019-12-24 LAB — BRAIN NATRIURETIC PEPTIDE: B Natriuretic Peptide: 17.8 pg/mL (ref 0.0–100.0)

## 2019-12-24 LAB — PROCALCITONIN: Procalcitonin: <0.1 ng/mL

## 2019-12-24 MED ORDER — DOCUSATE SODIUM 100 MG PO CAPS
100.0000 mg | ORAL_CAPSULE | Freq: Two times a day (BID) | ORAL | Status: DC
  Administered 2019-12-24 – 2019-12-29 (×8): 100 mg via ORAL
  Filled 2019-12-24 (×9): qty 1

## 2019-12-24 MED ORDER — SODIUM CHLORIDE 0.9% FLUSH: 3.0000 mL | INTRAVENOUS | Status: DC | PRN

## 2019-12-24 MED ORDER — ENOXAPARIN SODIUM 40 MG/0.4ML ~~LOC~~ SOLN
40.0000 mg | SUBCUTANEOUS | Status: DC
  Administered 2019-12-24 – 2019-12-29 (×6): 40 mg via SUBCUTANEOUS
  Filled 2019-12-24 (×6): qty 0.4

## 2019-12-24 MED ORDER — SODIUM CHLORIDE 0.9 % IV SOLN: 200.0000 mg | Freq: Once | INTRAVENOUS | Status: DC

## 2019-12-24 MED ORDER — BUSPIRONE HCL 5 MG PO TABS
10.0000 mg | ORAL_TABLET | Freq: Two times a day (BID) | ORAL | Status: DC
  Administered 2019-12-24 – 2019-12-25 (×2): 10 mg via ORAL
  Filled 2019-12-24 (×2): qty 2

## 2019-12-24 MED ORDER — BENZONATATE 100 MG PO CAPS
100.0000 mg | ORAL_CAPSULE | Freq: Two times a day (BID) | ORAL | Status: DC | PRN
  Administered 2019-12-24 – 2019-12-26 (×4): 100 mg via ORAL
  Filled 2019-12-24 (×4): qty 1

## 2019-12-24 MED ORDER — SODIUM CHLORIDE 0.9 % IV SOLN
200.0000 mg | Freq: Once | INTRAVENOUS | Status: AC
  Administered 2019-12-24: 200 mg via INTRAVENOUS
  Filled 2019-12-24: qty 200

## 2019-12-24 MED ORDER — DEXAMETHASONE 4 MG PO TABS
6.0000 mg | ORAL_TABLET | ORAL | Status: DC
  Administered 2019-12-24 – 2019-12-29 (×6): 6 mg via ORAL
  Filled 2019-12-24 (×2): qty 2
  Filled 2019-12-24: qty 4
  Filled 2019-12-24 (×3): qty 2
  Filled 2019-12-24: qty 4

## 2019-12-24 MED ORDER — SODIUM CHLORIDE 0.9 % IV SOLN
100.0000 mg | Freq: Every day | INTRAVENOUS | Status: AC
  Administered 2019-12-25 – 2019-12-28 (×4): 100 mg via INTRAVENOUS
  Filled 2019-12-24 (×5): qty 20

## 2019-12-24 MED ORDER — DOXYCYCLINE HYCLATE 100 MG PO TABS
100.0000 mg | ORAL_TABLET | Freq: Once | ORAL | Status: AC
  Administered 2019-12-24: 100 mg via ORAL
  Filled 2019-12-24: qty 1

## 2019-12-24 MED ORDER — ACETAMINOPHEN 325 MG PO TABS
650.0000 mg | ORAL_TABLET | Freq: Four times a day (QID) | ORAL | Status: DC | PRN
  Administered 2019-12-24 – 2019-12-26 (×4): 650 mg via ORAL
  Filled 2019-12-24 (×4): qty 2

## 2019-12-24 MED ORDER — ONDANSETRON HCL 4 MG/2ML IJ SOLN
4.0000 mg | Freq: Four times a day (QID) | INTRAMUSCULAR | Status: DC | PRN
  Administered 2019-12-24 – 2019-12-28 (×2): 4 mg via INTRAVENOUS
  Filled 2019-12-24 (×3): qty 2

## 2019-12-24 MED ORDER — ALBUTEROL SULFATE HFA 108 (90 BASE) MCG/ACT IN AERS
2.0000 | INHALATION_SPRAY | Freq: Four times a day (QID) | RESPIRATORY_TRACT | Status: DC
  Administered 2019-12-24 – 2019-12-29 (×19): 2 via RESPIRATORY_TRACT
  Filled 2019-12-24: qty 6.7

## 2019-12-24 MED ORDER — TOPIRAMATE 25 MG PO TABS
25.0000 mg | ORAL_TABLET | Freq: Two times a day (BID) | ORAL | Status: DC
  Administered 2019-12-24 – 2019-12-25 (×2): 25 mg via ORAL
  Filled 2019-12-24 (×2): qty 1

## 2019-12-24 MED ORDER — ZINC SULFATE 220 (50 ZN) MG PO CAPS
220.0000 mg | ORAL_CAPSULE | Freq: Every day | ORAL | Status: DC
  Administered 2019-12-25 – 2019-12-29 (×5): 220 mg via ORAL
  Filled 2019-12-24 (×5): qty 1

## 2019-12-24 MED ORDER — ASCORBIC ACID 500 MG PO TABS
500.0000 mg | ORAL_TABLET | Freq: Every day | ORAL | Status: DC
  Administered 2019-12-25 – 2019-12-29 (×5): 500 mg via ORAL
  Filled 2019-12-24 (×5): qty 1

## 2019-12-24 MED ORDER — SODIUM CHLORIDE 0.9% FLUSH
3.0000 mL | Freq: Two times a day (BID) | INTRAVENOUS | Status: DC
  Administered 2019-12-25 – 2019-12-29 (×5): 3 mL via INTRAVENOUS

## 2019-12-24 MED ORDER — SODIUM CHLORIDE 0.9 % IV BOLUS
250.0000 mL | Freq: Once | INTRAVENOUS | Status: AC
  Administered 2019-12-24: 250 mL via INTRAVENOUS

## 2019-12-24 MED ORDER — SODIUM CHLORIDE 0.9 % IV SOLN
250.0000 mL | INTRAVENOUS | Status: DC | PRN
  Administered 2019-12-28: 250 mL via INTRAVENOUS

## 2019-12-24 MED ORDER — GUAIFENESIN-DM 100-10 MG/5ML PO SYRP: 10.0000 mL | ORAL_SOLUTION | ORAL | Status: DC | PRN

## 2019-12-24 MED ORDER — DEXAMETHASONE 4 MG PO TABS
4.0000 mg | ORAL_TABLET | Freq: Once | ORAL | Status: AC
  Administered 2019-12-24: 4 mg via ORAL
  Filled 2019-12-24: qty 1

## 2019-12-24 MED ORDER — LUBIPROSTONE 8 MCG PO CAPS
8.0000 ug | ORAL_CAPSULE | Freq: Two times a day (BID) | ORAL | Status: DC
  Administered 2019-12-24 – 2019-12-25 (×2): 8 ug via ORAL
  Filled 2019-12-24 (×3): qty 1

## 2019-12-24 MED ORDER — SODIUM CHLORIDE 0.9 % IV SOLN: 100.0000 mg | Freq: Every day | INTRAVENOUS | Status: DC

## 2019-12-24 MED ORDER — VENLAFAXINE HCL ER 75 MG PO CP24
75.0000 mg | ORAL_CAPSULE | Freq: Every day | ORAL | Status: DC
  Administered 2019-12-25: 75 mg via ORAL
  Filled 2019-12-24: qty 1

## 2019-12-24 MED ORDER — ONDANSETRON HCL 4 MG PO TABS: 4.0000 mg | ORAL_TABLET | Freq: Four times a day (QID) | ORAL | Status: DC | PRN

## 2019-12-24 NOTE — ED Notes (Signed)
Pt assisted to wheelchair to get to bathroom. Well tolerated, no significant increase in WOB or SOB, sats maintained

## 2019-12-24 NOTE — ED Triage Notes (Signed)
Pt to triage via w/c with no distress noted; pt reports +COVID on Friday (pt has copy of results); c/o body aches, dizziness, N/V and upper CP

## 2019-12-24 NOTE — Progress Notes (Signed)
Remdesivir - Pharmacy Brief Note   O:  CXR: multifocal pneumonia SpO2: 87-89% on RA   A/P:  Remdesivir 200 mg IVPB once followed by 100 mg IVPB daily x 4 days.   Clovia Cuff, PharmD, BCPS 12/24/2019 12:00 PM

## 2019-12-24 NOTE — ED Notes (Signed)
Picture of positive covid test result added to chart under media by Dr. Vicente Males

## 2019-12-24 NOTE — ED Notes (Signed)
Pt 87-89% on room air while in bed, pt placed on 2 L [place], sats remain below 90, increased to 4 L [place], sats remain below 90, increased to 5 L [place] pt sats 89-90%

## 2019-12-24 NOTE — ED Notes (Signed)
Ambulated patient in the room to assess ambulatory SATS.  sats remained 95 to 96% RA . At rest patients SAT to 93% RA

## 2019-12-24 NOTE — ED Notes (Signed)
Pt continues to complain of headache, cough, chest soreness. Pt states that liquid cough medicine will "make me throw up," declines even after zofran admin earlier. Will message admitting for tesselon pearls or another pill/capsule form cough medicine

## 2019-12-24 NOTE — ED Provider Notes (Signed)
[facility] Emergency Department Provider Note   ____________________________________________   Event Date/Time   First MD Initiated Contact with Patient 12/24/19 [phone]     (approximate)  I have reviewed the triage vital signs and the nursing notes.   HISTORY  Chief Complaint No chief complaint on file.    HPI Kristen Mcpherson is a 39 y.o. female with a stated past medical history of anxiety, asthma, and recent diagnosis of Covid approximately 9 days prior to arrival who presents for worsening shortness of breath, fevers, chest pain, body aches, nausea.  Patient states that the symptoms have been worsening over the last 12 days.  Patient has not been on any medications except over-the-counter Tylenol and Motrin with little relief.  Patient currently denies any vision changes, tinnitus, difficulty speaking, facial droop, sore throat, abdominal pain, nausea/vomiting/diarrhea, dysuria, or weakness/numbness/paresthesias in any extremity         Past Medical History:  Diagnosis Date  . AMA (advanced maternal age) multigravida 37+, second trimester 08/29/2017  . Anemia   . Anxiety   . Asthma   . Breech presentation delivered 03/20/2018  . Cesarean delivery indicated due to breech presentation 04/03/2018  . 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 Antibody:  Anatomic US  complete Rubella: Immune (07/19 0000)  Varicella: Immune GTT Early: WNLThird trimester:  RPR: Nonreactive (07/19 0000)  Rhogam  not needed HBsAg: Negative (07/19 0000)  TDaP vaccine                    . Iron deficiency anemia 01/08/2018    Patient Active Problem List   Diagnosis Date Noted  . CIN III (cervical intraepithelial neoplasia grade III) with severe dysplasia 06/05/2018  . Normal labor and delivery  03/20/2018  . Positive GBS test 03/04/2018  . Iron deficiency anemia 01/08/2018  . Hypotension 01/03/2018  . 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 HSIL on Pap smear of cervix 10/07/2017  . Anxiety and depression 08/29/2017    Past Surgical History:  Procedure Laterality Date  . APPENDECTOMY    . CERVICAL CONIZATION W/BX N/A 06/12/2018   Procedure: CONIZATION CERVIX WITH BIOPSY - COLD KNIFE;  Surgeon: Nadara MustardHarris, Robert P, MD;  Location: [facility] ORS;  Service: Gynecology;  Laterality: N/A;  . CESAREAN SECTION N/A 03/20/2018   Procedure: CESAREAN SECTION;  Surgeon: Nadara MustardHarris, Robert P, MD;  Location: [facility] ORS;  Service: Obstetrics;  Laterality: N/A;  . TUBAL LIGATION Bilateral 03/20/2018   Procedure: BILATERAL TUBAL LIGATION;  Surgeon: Nadara MustardHarris, Robert P, MD;  Location: [facility] ORS;  Service: Obstetrics;  Laterality: Bilateral;    Prior to Admission medications   Medication Sig Start Date End Date Taking? Authorizing Provider  busPIRone (BUSPAR) 10 MG tablet Take 1 tablet (10 mg total) by mouth 2 (two) times daily. 01/27/19   Doren CustardBoyce, Emily E, FNP  Diethylpropion HCl CR 75 MG TB24 Take 1 tablet (75 mg total) by mouth daily before breakfast. 08/11/19   Nadara MustardHarris, Robert P, MD  docusate sodium (COLACE) 100 MG capsule TAKE 1 CAPSULE BY MOUTH TWICE A DAY 01/12/19   Doren CustardBoyce, Emily E, FNP  ibuprofen (ADVIL) 200 MG tablet Take 200 mg by mouth every 6 (six) hours as needed.    [provider]  levonorgestrel (MIRENA) 20 MCG/24HR IUD 1 each by Intrauterine route once.  [provider]  lubiprostone (AMITIZA) 8 MCG capsule Take 1 capsule (8 mcg total) by mouth 2 (two) times daily with a meal. 08/28/18   Doren Custard, FNP  phentermine 15 MG capsule Take 1 capsule (15 mg total) by mouth 2 (two) times daily. 06/17/19   Nadara Mustard, MD  topiramate (TOPAMAX) 25 MG tablet TAKE 1 TABLET BY MOUTH TWICE A DAY 11/07/19   Nadara Mustard, MD  venlafaxine XR (EFFEXOR XR) 37.5 MG 24 hr capsule Take 1 tablet once  daily for 2 weeks, then increase to 2 tablets once daily. 12/01/18   Doren Custard, FNP    Allergies Patient has no known allergies.  Family History  Problem Relation Age of Onset  . Diabetes Mother   . Arthritis Mother   . Depression Mother   . Anxiety disorder Mother   . Anemia Mother   . Stroke Mother   . Restless legs syndrome Mother   . Hyperlipidemia Father   . Depression Father   . Hypertension Father   . ADD / ADHD Brother   . Diabetes Maternal Grandmother   . Liver disease Maternal Grandmother   . Rheum arthritis Maternal Grandfather   . Dementia Maternal Grandfather     Social History Social History   Tobacco Use  . Smoking status: Current Every Day Smoker    Packs/day: 0.50    Types: Cigarettes    Start date: 01/02/2000  . Smokeless tobacco: Never Used  Vaping Use  . Vaping Use: Never used  Substance Use Topics  . Alcohol use: Yes    Comment: once in a blue moon  . Drug use: Never    Review of Systems Constitutional: No fever/chills Eyes: No visual changes. ENT: No sore throat. Cardiovascular: Endorses chest pain. Respiratory: Endorses shortness of breath. Gastrointestinal: No abdominal pain.  No nausea, no vomiting.  No diarrhea. Genitourinary: Negative for dysuria. Musculoskeletal: Negative for acute arthralgias Skin: Negative for rash. Neurological: Positive for headaches, weakness/numbness/paresthesias in any extremity Psychiatric: Negative for suicidal ideation/homicidal ideation   ____________________________________________   PHYSICAL EXAM:  VITAL SIGNS: ED Triage Vitals [12/24/19 0513]  Enc Vitals Group     BP (!) 103/57     Pulse Rate (!) 126     Resp 18     Temp (!) 100.7 F (38.2 C)     Temp Source Oral     SpO2 92 %     Weight 185 lb (83.9 kg)     Height 5\' 6"  (1.676 m)     Head Circumference      Peak Flow      Pain Score 10     Pain Loc      Pain Edu?      Excl. in GC?    Constitutional: Alert and oriented. Well  appearing and in no acute distress. Eyes: Conjunctivae are normal. PERRL. Head: Atraumatic. Nose: No congestion/rhinnorhea. Mouth/Throat: Mucous membranes are moist. Neck: No stridor Cardiovascular: Grossly normal heart sounds.  Good peripheral circulation. Respiratory: Tachypneic.  Increased respiratory effort.  No retractions. Gastrointestinal: Soft and nontender. No distention. Musculoskeletal: No obvious deformities Neurologic:  Normal speech and language. No gross focal neurologic deficits are appreciated. Skin:  Skin is warm and dry. No rash noted. Psychiatric: Mood and affect are normal. Speech and behavior are normal.  ____________________________________________   LABS (all labs ordered are listed, but only abnormal results are displayed)  Labs Reviewed  CBC WITH DIFFERENTIAL/PLATELET - Abnormal; Notable for the following components:  Result Value   Lymphs Abs 0.6 (*)    All other components within normal limits  COMPREHENSIVE METABOLIC PANEL - Abnormal; Notable for the following components:   Glucose, Bld 178 (*)    Calcium 8.2 (*)    Albumin 3.2 (*)    AST 42 (*)    Alkaline Phosphatase 35 (*)    All other components within normal limits  BRAIN NATRIURETIC PEPTIDE  PROCALCITONIN  TROPONIN I (HIGH SENSITIVITY)  TROPONIN I (HIGH SENSITIVITY)   ____________________________________________  EKG  ED ECG REPORT I, Merwyn Katos, the attending physician, personally viewed and interpreted this ECG.  Date: 12/24/2019 EKG Time: 0509 Rate: 127 Rhythm: Tachycardic sinus rhythm QRS Axis: normal Intervals: normal ST/T Wave abnormalities: normal Narrative Interpretation: no evidence of acute ischemia  ____________________________________________  RADIOLOGY  ED MD interpretation: 2 view x-ray of the chest shows diffuse multifocal opacities throughout both lungs consistent with multifocal pneumonia of COVID-19.  No evidence of pneumothorax or widened  mediastinum  Official radiology report(s): DG Chest 2 View  Result Date: 12/24/2019 CLINICAL DATA:  COVID-19 positive, body aches, dizziness, nausea, vomiting and upper chest pain EXAM: CHEST - 2 VIEW COMPARISON:  None. FINDINGS: Diffuse multifocal opacities throughout both lungs in a mid to lower lung and peripheral predominance. Some airways thickening and coarsened interstitial changes present as well. No pneumothorax. No effusion. The cardiomediastinal contours are unremarkable. No acute osseous or soft tissue abnormality. IMPRESSION: Diffuse multifocal opacities throughout both lungs in a mid to lower lung and peripheral predominance, compatible with multifocal pneumonia in the setting of COVID-19 positivity. Electronically Signed   By: Kreg Shropshire M.D.   On: 12/24/2019 05:36    ____________________________________________   PROCEDURES  Procedure(s) performed (including Critical Care):  .Critical Care Performed by: Merwyn Katos, MD Authorized by: Merwyn Katos, MD   Critical care provider statement:    Critical care time (minutes):  47   Critical care time was exclusive of:  Separately billable procedures and treating other patients   Critical care was necessary to treat or prevent imminent or life-threatening deterioration of the following conditions:  Respiratory failure   Critical care was time spent personally by me on the following activities:  Discussions with consultants, evaluation of patient's response to treatment, examination of patient, ordering and performing treatments and interventions, ordering and review of laboratory studies, ordering and review of radiographic studies, pulse oximetry, re-evaluation of patient's condition, obtaining history from patient or surrogate and review of old charts   I assumed direction of critical care for this patient from another provider in my specialty: no     Care discussed with: admitting provider   .1-3 Lead EKG  Interpretation Performed by: Merwyn Katos, MD Authorized by: Merwyn Katos, MD     Interpretation: abnormal     ECG rate:  112   ECG rate assessment: tachycardic     Rhythm: sinus tachycardia     Ectopy: none     Conduction: normal       ____________________________________________   INITIAL IMPRESSION / ASSESSMENT AND PLAN / ED COURSE  As part of my medical decision making, I reviewed the following data within the electronic MEDICAL RECORD NUMBER Nursing notes reviewed and incorporated, Labs reviewed, EKG interpreted, Old chart reviewed, Radiograph reviewed and Notes from prior ED visits reviewed and incorporated        Presentation most consistent with Viral Syndrome.  Patient has tested positive for COVID-19. At this time patient is requiring submental  oxygenation due to acute hypoxic respiratory failure.  Given History and Exam I have a lower suspicion for: Emergent CardioPulmonary causes [such as Acute Asthma or COPD Exacerbation, acute Heart Failure or exacerbation, PE, PTX, atypical ACS, PNA]. Emergent Otolaryngeal causes [such as PTA, RPA, Ludwigs, Epiglottitis, EBV].  Regarding Emergent Travel or Immunosuppressive related infectious: I have a low suspicion for acute HIV.  Given radiologic evidence for patchy bilateral airspace opacities concerning for viral pneumonia, continued need for supplemental oxygenation due to acute hypoxic respiratory failure, and need for further evaluation and management, patient will require admission      ____________________________________________   FINAL CLINICAL IMPRESSION(S) / ED DIAGNOSES  Final diagnoses:  None     ED Discharge Orders    None       Note:  This document was prepared using Dragon voice recognition software and may include unintentional dictation errors.   Merwyn Katos, MD 12/24/19 [phone]

## 2019-12-24 NOTE — H&P (Signed)
History and Physical    Kristen Mcpherson ZOX:[id number] DOB: [birth year] DOA: 12/24/2019  PCP: Kristen CustardBoyce, Kristen Mcpherson, Mcpherson   Patient coming from: Home  I have personally briefly [facility]  Chief Complaint: Shortness of breath  HPI: Kristen Mcpherson is a 39 y.o. female with medical history significant for anxiety disorder, asthma who presents to the emergency room for evaluation of shortness of breath.  Patient states that she started feeling sick for about 9 days prior to her admission.  She states that she had fever, myalgias, dry cough, anorexia, nausea, vomiting and diarrhea and tested positive for the COVID-19 virus on 12/18/19.  She had been taking over-the-counter Tylenol and Motrin with little relief.  Over the last 2 days she developed shortness of breath both with rest and exertion which prompted her visit to the emergency room.  She complains of generalized weakness and fatigue She denies having any palpitations, no diaphoresis, no dizziness, no lightheadedness, no constipation, no leg swelling, no blurry vision, no paresthesias In the emergency room she was noted to have room air pulse oximetry at rest between 87 - 89% and was initially placed on 2 L of oxygen but is currently requiring 5 L of oxygen via nasal cannula. Labs show sodium 136, potassium 4.5, chloride 102, bicarb 25, glucose 178, BUN 12, creatinine 0.60, calcium 8.2, alkaline phosphatase 35, albumin 3.2, AST 42, ALT 33, total protein 6.5, BNP 17.8, procalcitonin less than 0.10, white count 5.7, hemoglobin 12.4, hematocrit 36.7, MCV 92.7, RDW 13.2, platelet count 174 Chest x-ray reviewed by me shows diffuse multifocal opacities throughout both lungs in a mid to lower lung and peripheral predominance, compatible with multifocal pneumonia in the setting of COVID-19 positivity. Twelve-lead EKG shows sinus tachycardia with LVH   ED Course: Patient is a 39 year old female who is being  admitted to the hospital for Covid pneumonia with acute respiratory failure.  She is currently on 5 L of oxygen via nasal cannula.  Patient's COVID-19 PCR test was positive on 12/18/19.  She will be admitted to the hospital for further evaluation and treatment.  Review of Systems: As per HPI otherwise all systems reviewed and negative.    Past Medical History:  Diagnosis Date  . AMA (advanced maternal age) multigravida 37+, second trimester 08/29/2017  . Anemia   . Anxiety   . Asthma   . Breech presentation delivered 03/20/2018  . Cesarean delivery indicated due to breech presentation 04/03/2018  . 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 Antibody:  Anatomic US  complete Rubella: Immune (07/19 0000)  Varicella: Immune GTT Early: WNLThird trimester:  RPR: Nonreactive (07/19 0000)  Rhogam  not needed HBsAg: Negative (07/19 0000)  TDaP vaccine                    . Iron deficiency anemia 01/08/2018    Past Surgical History:  Procedure Laterality Date  . APPENDECTOMY    . CERVICAL CONIZATION W/BX N/A 06/12/2018   Procedure: CONIZATION CERVIX WITH BIOPSY - COLD KNIFE;  Surgeon: Kristen MustardHarris, Kristen Mcpherson, Mcpherson;  Location: [facility] ORS;  Service: Gynecology;  Laterality: N/A;  . CESAREAN SECTION N/A 03/20/2018   Procedure: CESAREAN SECTION;  Surgeon: Kristen MustardHarris, Kristen Mcpherson, Mcpherson;  Location: [facility] ORS;  Service: Obstetrics;  Laterality: N/A;  . TUBAL LIGATION Bilateral  03/20/2018   Procedure: BILATERAL TUBAL LIGATION;  Surgeon: Kristen Mcpherson;  Location: [facility] ORS;  Service: Obstetrics;  Laterality: Bilateral;     reports that she has been smoking cigarettes. She started smoking about 19 years ago. She has been smoking about 0.50 packs per day. She has never used smokeless tobacco. She reports current alcohol use. She reports that she does  not use drugs.  No Known Allergies  Family History  Problem Relation Age of Onset  . Diabetes Mother   . Arthritis Mother   . Depression Mother   . Anxiety disorder Mother   . Anemia Mother   . Stroke Mother   . Restless legs syndrome Mother   . Hyperlipidemia Father   . Depression Father   . Hypertension Father   . ADD / ADHD Brother   . Diabetes Maternal Grandmother   . Liver disease Maternal Grandmother   . Rheum arthritis Maternal Grandfather   . Dementia Maternal Grandfather      Prior to Admission medications   Medication Sig Start Date End Date Taking? Authorizing Provider  busPIRone (BUSPAR) 10 MG tablet Take 1 tablet (10 mg total) by mouth 2 (two) times daily. 01/27/19   Kristen Mcpherson  Diethylpropion HCl CR 75 MG TB24 Take 1 tablet (75 mg total) by mouth daily before breakfast. 08/11/19   Kristen Mcpherson  docusate sodium (COLACE) 100 MG capsule TAKE 1 CAPSULE BY MOUTH TWICE A DAY 01/12/19   Kristen Mcpherson  ibuprofen (ADVIL) 200 MG tablet Take 200 mg by mouth every 6 (six) hours as needed.    Provider, Historical, Mcpherson  levonorgestrel (MIRENA) 20 MCG/24HR IUD 1 each by Intrauterine route once.    Provider, Historical, Mcpherson  lubiprostone (AMITIZA) 8 MCG capsule Take 1 capsule (8 mcg total) by mouth 2 (two) times daily with a meal. 08/28/18   Kristen Mcpherson  phentermine 15 MG capsule Take 1 capsule (15 mg total) by mouth 2 (two) times daily. 06/17/19   Kristen Mcpherson  topiramate (TOPAMAX) 25 MG tablet TAKE 1 TABLET BY MOUTH TWICE A DAY 11/07/19   Kristen Mcpherson  venlafaxine XR (EFFEXOR XR) 37.5 MG 24 hr capsule Take 1 tablet once daily for 2 weeks, then increase to 2 tablets once daily. 12/01/18   Kristen Mcpherson    Physical Exam: Vitals:   12/24/19 1150 12/24/19 1151 12/24/19 1200 12/24/19 1230  BP:   97/65 99/67  Pulse: (!) 110 (!) 110 (!) 110 (!) 105  Resp: (!) 27 (!) 37 (!) 43 (!) 34  Temp:      TempSrc:      SpO2: 91% 92% 95% 93%   Weight:      Height:         Vitals:   12/24/19 1150 12/24/19 1151 12/24/19 1200 12/24/19 1230  BP:   97/65 99/67  Pulse: (!) 110 (!) 110 (!) 110 (!) 105  Resp: (!) 27 (!) 37 (!) 43 (!) 34  Temp:      TempSrc:      SpO2: 91% 92% 95% 93%  Weight:      Height:        Constitutional: NAD, alert and oriented x 3.  Acutely ill-appearing.  Has conversational dyspnea Eyes: PERRL, lids and conjunctivae pallor ENMT: Mucous membranes are moist.  Neck: normal, supple, no masses, no thyromegaly Respiratory: Bilateral air entry, no wheezing, no crackles.  Tachypnea. No accessory muscle use.  Cardiovascular: Tachycardia, no murmurs / rubs / gallops. No extremity edema. 2+ pedal pulses. No carotid bruits.  Abdomen: no tenderness, no masses palpated. No hepatosplenomegaly. Bowel sounds positive.  Musculoskeletal: no clubbing / cyanosis. No joint deformity upper and lower extremities.  Skin: no rashes, lesions, ulcers.  Neurologic: No gross focal neurologic deficit.  Generalized weakness Psychiatric: Normal mood and affect.   Labs on Admission: I have personally reviewed following labs and imaging studies  CBC: Recent Labs  Lab 12/24/19 0521  WBC 5.7  NEUTROABS 4.9  HGB 12.4  HCT 36.7  MCV 92.7  PLT 174   Basic Metabolic Panel: Recent Labs  Lab 12/24/19 0521  NA 136  K 4.5  CL 102  CO2 25  GLUCOSE 178*  BUN 12  CREATININE 0.60  CALCIUM 8.2*   GFR: Estimated Creatinine Clearance: 105 mL/min (by C-G formula based on SCr of 0.6 mg/dL). Liver Function Tests: Recent Labs  Lab 12/24/19 0521  AST 42*  ALT 33  ALKPHOS 35*  BILITOT 0.4  PROT 6.5  ALBUMIN 3.2*   No results for input(s): LIPASE, AMYLASE in the last 168 hours. No results for input(s): AMMONIA in the last 168 hours. Coagulation Profile: No results for input(s): INR, PROTIME in the last 168 hours. Cardiac Enzymes: No results for input(s): CKTOTAL, CKMB, CKMBINDEX, TROPONINI in the last 168 hours. BNP  (last 3 results) No results for input(s): PROBNP in the last 8760 hours. HbA1C: No results for input(s): HGBA1C in the last 72 hours. CBG: No results for input(s): GLUCAP in the last 168 hours. Lipid Profile: No results for input(s): CHOL, HDL, LDLCALC, TRIG, CHOLHDL, LDLDIRECT in the last 72 hours. Thyroid Function Tests: No results for input(s): TSH, T4TOTAL, FREET4, T3FREE, THYROIDAB in the last 72 hours. Anemia Panel: No results for input(s): VITAMINB12, FOLATE, FERRITIN, TIBC, IRON, RETICCTPCT in the last 72 hours. Urine analysis:    Component Value Date/Time   COLORURINE YELLOW (A) 12/06/2017 1400   APPEARANCEUR CLOUDY (A) 12/06/2017 1400   LABSPEC 1.009 12/06/2017 1400   PHURINE 7.0 12/06/2017 1400   GLUCOSEU Negative 03/19/2018 0828   HGBUR NEGATIVE 12/06/2017 1400   BILIRUBINUR NEGATIVE 12/06/2017 1400   KETONESUR NEGATIVE 12/06/2017 1400   PROTEINUR NEGATIVE 12/06/2017 1400   NITRITE NEGATIVE 12/06/2017 1400   LEUKOCYTESUR TRACE (A) 12/06/2017 1400    Radiological Exams on Admission: DG Chest 2 View  Result Date: 12/24/2019 CLINICAL DATA:  COVID-19 positive, body aches, dizziness, nausea, vomiting and upper chest pain EXAM: CHEST - 2 VIEW COMPARISON:  None. FINDINGS: Diffuse multifocal opacities throughout both lungs in a mid to lower lung and peripheral predominance. Some airways thickening and coarsened interstitial changes present as well. No pneumothorax. No effusion. The cardiomediastinal contours are unremarkable. No acute osseous or soft tissue abnormality. IMPRESSION: Diffuse multifocal opacities throughout both lungs in a mid to lower lung and peripheral predominance, compatible with multifocal pneumonia in the setting of COVID-19 positivity. Electronically Signed   By: Kreg Shropshire M.D.   On: 12/24/2019 05:36    EKG: Independently reviewed.  Sinus tachycardia  Assessment/Plan Principal Problem:   Pneumonia due to COVID-19 virus Active Problems:   Anxiety  and depression   Acute respiratory failure due to COVID-19 [facility])     Pneumonia due to COVID-19 virus with acute respiratory failure  Patient presents to the emergency room for evaluation of worsening shortness of breath, fever, chills and a nonproductive cough Her COVID-19 PCR test was positive on 12/18/19 Patient is unvaccinated We will  place patient on remdesivir per protocol Place patient on Decadron 6 mg IV daily Supportive care with bronchodilator therapy and antitussives Patient had room air pulse oximetry between 87 and 89%, has conversational dyspnea and tachypnea She is currently on 5 L of oxygen via nasal cannula maintaining pulse oximetry of about 97% Continue oxygen supplementation    Anxiety and depression Continue buspirone and Effexor      DVT prophylaxis: Lovenox Code Status: Full code Family Communication: Greater than 50% of time was spent discussing plan of care with patient at the bedside.  All questions and concerns have been addressed.  She verbalizes understanding and agrees with the plan. Disposition Plan: Back to previous home environment Consults called: None    Calysta Craigo Mcpherson [facility]     12/24/2019, 1:39 PM

## 2019-12-24 NOTE — ED Notes (Signed)
Admitting MD messaged regarding pt's noted lack or PO intake including fluids, along with pt's soft BP that decreases when sleeping. Orders received.   Admitting MD also notified of pt's continued headache/chest pain, unchanged by PO tylenol. No orders received

## 2019-12-25 DIAGNOSIS — J1282 Pneumonia due to coronavirus disease 2019: Secondary | ICD-10-CM | POA: Diagnosis not present

## 2019-12-25 DIAGNOSIS — F419 Anxiety disorder, unspecified: Secondary | ICD-10-CM | POA: Diagnosis not present

## 2019-12-25 DIAGNOSIS — U071 COVID-19: Secondary | ICD-10-CM | POA: Diagnosis not present

## 2019-12-25 DIAGNOSIS — J9600 Acute respiratory failure, unspecified whether with hypoxia or hypercapnia: Secondary | ICD-10-CM | POA: Diagnosis not present

## 2019-12-25 LAB — CBC WITH DIFFERENTIAL/PLATELET
Abs Immature Granulocytes: 0.01 10*3/uL (ref 0.00–0.07)
Basophils Absolute: 0 10*3/uL (ref 0.0–0.1)
Basophils Relative: 0 %
Eosinophils Absolute: 0 10*3/uL (ref 0.0–0.5)
Eosinophils Relative: 0 %
HCT: 37.1 % (ref 36.0–46.0)
Hemoglobin: 12.2 g/dL (ref 12.0–15.0)
Immature Granulocytes: 0 %
Lymphocytes Relative: 32 %
Lymphs Abs: 0.7 10*3/uL (ref 0.7–4.0)
MCH: 30.9 pg (ref 26.0–34.0)
MCHC: 32.9 g/dL (ref 30.0–36.0)
MCV: 93.9 fL (ref 80.0–100.0)
Monocytes Absolute: 0.2 10*3/uL (ref 0.1–1.0)
Monocytes Relative: 10 %
Neutro Abs: 1.3 10*3/uL — ABNORMAL LOW (ref 1.7–7.7)
Neutrophils Relative %: 58 %
Platelets: 174 10*3/uL (ref 150–400)
RBC: 3.95 MIL/uL (ref 3.87–5.11)
RDW: 13.2 % (ref 11.5–15.5)
Smear Review: NORMAL
WBC: 2.3 10*3/uL — ABNORMAL LOW (ref 4.0–10.5)
nRBC: 0 % (ref 0.0–0.2)

## 2019-12-25 LAB — PHOSPHORUS: Phosphorus: 3.5 mg/dL (ref 2.5–4.6)

## 2019-12-25 LAB — COMPREHENSIVE METABOLIC PANEL
ALT: 51 U/L — ABNORMAL HIGH (ref 0–44)
AST: 56 U/L — ABNORMAL HIGH (ref 15–41)
Albumin: 2.9 g/dL — ABNORMAL LOW (ref 3.5–5.0)
Alkaline Phosphatase: 30 U/L — ABNORMAL LOW (ref 38–126)
Anion gap: 8 (ref 5–15)
BUN: 15 mg/dL (ref 6–20)
CO2: 25 mmol/L (ref 22–32)
Calcium: 7.9 mg/dL — ABNORMAL LOW (ref 8.9–10.3)
Chloride: 108 mmol/L (ref 98–111)
Creatinine, Ser: 0.51 mg/dL (ref 0.44–1.00)
GFR, Estimated: >60 mL/min (ref >60)
Glucose, Bld: 152 mg/dL — ABNORMAL HIGH (ref 70–99)
Potassium: 4 mmol/L (ref 3.5–5.1)
Sodium: 141 mmol/L (ref 135–145)
Total Bilirubin: 0.3 mg/dL (ref 0.3–1.2)
Total Protein: 6.3 g/dL — ABNORMAL LOW (ref 6.5–8.1)

## 2019-12-25 LAB — HIV ANTIBODY (ROUTINE TESTING W REFLEX): HIV Screen 4th Generation wRfx: NONREACTIVE

## 2019-12-25 LAB — FERRITIN: Ferritin: 425 ng/mL — ABNORMAL HIGH (ref 11–307)

## 2019-12-25 LAB — MAGNESIUM: Magnesium: 2.4 mg/dL (ref 1.7–2.4)

## 2019-12-25 LAB — FIBRIN DERIVATIVES D-DIMER (ARMC ONLY): Fibrin derivatives D-dimer (ARMC): 767.66 ng/mL (FEU) — ABNORMAL HIGH (ref 0.00–499.00)

## 2019-12-25 MED ORDER — LOPERAMIDE HCL 2 MG PO CAPS: 2.0000 mg | ORAL_CAPSULE | Freq: Three times a day (TID) | ORAL | Status: DC | PRN

## 2019-12-25 MED ORDER — LACTATED RINGERS IV SOLN: INTRAVENOUS | Status: DC

## 2019-12-25 NOTE — ED Notes (Signed)
Provider at bedside

## 2019-12-25 NOTE — ED Notes (Signed)
Pt ambulated to bedside toilet at this time. Pt shob on movement, transferred back to bed when finished. Pt comfortable in bed, oxygen 94% on 4 L. Will continue to monitor.

## 2019-12-25 NOTE — Progress Notes (Signed)
Pt arrived to 110 for continued medical treatment.  Ambulated to hospital bed from stretcher in hallway with minimal assist x2 on 4LNC.  Pt resting comfortably, denies pain.  Pt oriented to room and call bell within reach.

## 2019-12-25 NOTE — Progress Notes (Signed)
PIV attempted. Veins were obvious, but patient experienced severe venospasm with vein constriction upon venipuncture. Requested no further attempts for PIV after 3rd unsuccessful attempt. Primary RN notified.

## 2019-12-25 NOTE — Progress Notes (Addendum)
Progress Note    Kristen Mcpherson  XBJ:[id number] DOB: September 25, 1980  DOA: 12/24/2019 PCP: Doren Custard, FNP      Brief Narrative:    Medical records reviewed and are as summarized below:  Kristen Mcpherson is a 39 y.o. female with medical history significant for anxiety, asthma, who presented to the hospital because of increasing shortness of breath, generalized weakness and fatigue..  She said she had been feeling sick for about 9 days prior to coming to the ED.  She had fever, myalgia, dry cough, anorexia, nausea, vomiting and diarrhea.  She tested positive for COVID-19 infection on 12/18/2019.  She had been taking Tylenol and Motrin without relief.  She was hypoxic in the emergency room with oxygen saturation of 87 to 89% on room air.  She was found to have COVID-19 pneumonia complicated by acute hypoxic respiratory failure.    Assessment/Plan:   Principal Problem:   Pneumonia due to COVID-19 virus Active Problems:   Anxiety and depression   Acute respiratory failure due to COVID-19 (HCC)    Body mass index is 29.86 kg/m.  (Overweight)   COVID-19 pneumonia: Continue IV remdesivir and IV dexamethasone.  Acute hypoxic respiratory failure: Continue 4 L/min oxygen via nasal cannula.  Taper off oxygen as able.  Hypotension, dehydration: Treat with IV fluids.  COVID-19 gastroenteritis: Imodium as needed for diarrhea.  Antiemetics as needed for nausea/vomiting.  Anxiety: She said she no longer takes psychotropics.   Diet Order            Diet regular Room service appropriate? Yes; Fluid consistency: Thin  Diet effective now                    Consultants:  None  Procedures:  None    Medications:   . albuterol  2 puff Inhalation Q6H  . vitamin C  500 mg Oral Daily  . dexamethasone  6 mg Oral Q24H  . docusate sodium  100 mg Oral BID  . enoxaparin (LOVENOX) injection  40 mg Subcutaneous Q24H  . sodium chloride flush  3 mL Intravenous Q12H  . zinc  sulfate  220 mg Oral Daily   Continuous Infusions: . sodium chloride    . remdesivir 100 mg in NS 100 mL Stopped (12/25/19 1031)     Anti-infectives (From admission, onward)   Start     Dose/Rate Route Frequency Ordered Stop   12/25/19 1000  remdesivir 100 mg in sodium chloride 0.9 % 100 mL IVPB       "Followed by" Linked Group Details   100 mg 200 mL/hr over 30 Minutes Intravenous Daily 12/24/19 1200 12/29/19 0959   12/25/19 1000  remdesivir 100 mg in sodium chloride 0.9 % 100 mL IVPB  Status:  Discontinued       "Followed by" Linked Group Details   100 mg 200 mL/hr over 30 Minutes Intravenous Daily 12/24/19 1314 12/24/19 1317   12/24/19 1315  remdesivir 200 mg in sodium chloride 0.9% 250 mL IVPB  Status:  Discontinued       "Followed by" Linked Group Details   200 mg 580 mL/hr over 30 Minutes Intravenous Once 12/24/19 1314 12/24/19 1317   12/24/19 1300  remdesivir 200 mg in sodium chloride 0.9% 250 mL IVPB       "Followed by" Linked Group Details   200 mg 580 mL/hr over 30 Minutes Intravenous Once 12/24/19 1200 12/24/19 1553   12/24/19 0930  doxycycline (VIBRA-TABS) tablet 100 mg  100 mg Oral  Once 12/24/19 3212 12/24/19 0946             Family Communication/Anticipated D/C date and plan/Code Status   DVT prophylaxis: enoxaparin (LOVENOX) injection 40 mg Start: 12/24/19 1400     Code Status: Full Code  Family Communication: None Disposition Plan:    Status is: Inpatient  Remains inpatient appropriate because:IV treatments appropriate due to intensity of illness or inability to take PO and Inpatient level of care appropriate due to severity of illness   Dispo: The patient is from: Home              Anticipated d/c is to: Home              Anticipated d/c date is: > 3 days              Patient currently is not medically stable to d/c.           Subjective:   C/o cough, pleuritic chest pain and shortness of breath.  She had one loose stool  this morning.  Objective:    Vitals:   12/25/19 1115 12/25/19 1200 12/25/19 1215 12/25/19 1229  BP:  (!) 94/57    Pulse: 98 64 70   Resp: (!) 25 (!) 24 20 20   Temp:      TempSrc:      SpO2: 100% 96% 99%   Weight:      Height:       No data found.  No intake or output data in the 24 hours ending 12/25/19 1317 Filed Weights   12/24/19 0513  Weight: 83.9 kg    Exam:  GEN: NAD SKIN: Warm and dry EYES: No pallor or icterus ENT: MMM CV: RRR PULM: CTA B ABD: soft, obese, NT, +BS CNS: AAO x 3, non focal EXT: No edema or tenderness   Data Reviewed:   I have personally reviewed following labs and imaging studies:  Labs: Labs show the following:   Basic Metabolic Panel: Recent Labs  Lab 12/24/19 0521 12/25/19 0505  NA 136 141  K 4.5 4.0  CL 102 108  CO2 25 25  GLUCOSE 178* 152*  BUN 12 15  CREATININE 0.60 0.51  CALCIUM 8.2* 7.9*  MG  --  2.4  PHOS  --  3.5   GFR Estimated Creatinine Clearance: 105 mL/min (by C-G formula based on SCr of 0.51 mg/dL). Liver Function Tests: Recent Labs  Lab 12/24/19 0521 12/25/19 0505  AST 42* 56*  ALT 33 51*  ALKPHOS 35* 30*  BILITOT 0.4 0.3  PROT 6.5 6.3*  ALBUMIN 3.2* 2.9*   No results for input(s): LIPASE, AMYLASE in the last 168 hours. No results for input(s): AMMONIA in the last 168 hours. Coagulation profile No results for input(s): INR, PROTIME in the last 168 hours.  CBC: Recent Labs  Lab 12/24/19 0521 12/25/19 0505  WBC 5.7 2.3*  NEUTROABS 4.9 1.3*  HGB 12.4 12.2  HCT 36.7 37.1  MCV 92.7 93.9  PLT 174 174   Cardiac Enzymes: No results for input(s): CKTOTAL, CKMB, CKMBINDEX, TROPONINI in the last 168 hours. BNP (last 3 results) No results for input(s): PROBNP in the last 8760 hours. CBG: No results for input(s): GLUCAP in the last 168 hours. D-Dimer: No results for input(s): DDIMER in the last 72 hours. Hgb A1c: No results for input(s): HGBA1C in the last 72 hours. Lipid Profile: No  results for input(s): CHOL, HDL, LDLCALC, TRIG, CHOLHDL, LDLDIRECT in the  last 72 hours. Thyroid function studies: No results for input(s): TSH, T4TOTAL, T3FREE, THYROIDAB in the last 72 hours.  Invalid input(s): FREET3 Anemia work up: Recent Labs    12/25/19 0505  FERRITIN 425*   Sepsis Labs: Recent Labs  Lab 12/24/19 0521 12/25/19 0505  PROCALCITON <0.10  --   WBC 5.7 2.3*    Microbiology No results found for this or any previous visit (from the past 240 hour(s)).  Procedures and diagnostic studies:  DG Chest 2 View  Result Date: 12/24/2019 CLINICAL DATA:  COVID-19 positive, body aches, dizziness, nausea, vomiting and upper chest pain EXAM: CHEST - 2 VIEW COMPARISON:  None. FINDINGS: Diffuse multifocal opacities throughout both lungs in a mid to lower lung and peripheral predominance. Some airways thickening and coarsened interstitial changes present as well. No pneumothorax. No effusion. The cardiomediastinal contours are unremarkable. No acute osseous or soft tissue abnormality. IMPRESSION: Diffuse multifocal opacities throughout both lungs in a mid to lower lung and peripheral predominance, compatible with multifocal pneumonia in the setting of COVID-19 positivity. Electronically Signed   By: Kreg Shropshire M.D.   On: 12/24/2019 05:36               LOS: 1 day   Kristen Mcpherson  [facility]   Pager on [email or web address]. If 7PM-7AM, please contact night-coverage at [email or web address]     12/25/2019, 1:17 PM

## 2019-12-25 NOTE — ED Notes (Signed)
Pt assisted to bathroom

## 2019-12-25 NOTE — Progress Notes (Signed)
Pt return demonstrated IS usage and produced strong painful cough. Pt encouraged to continue turning side to side as best she can.

## 2019-12-25 NOTE — Plan of Care (Signed)
  Problem: Education: Goal: Knowledge of risk factors and measures for prevention of condition will improve Outcome: Progressing   Problem: Coping: Goal: Psychosocial and spiritual needs will be supported Outcome: Progressing   Problem: Education: Goal: Knowledge of risk factors and measures for prevention of condition will improve Outcome: Progressing   Problem: Respiratory: Goal: Will maintain a patent airway Outcome: Progressing Goal: Complications related to the disease process, condition or treatment will be avoided or minimized Outcome: Progressing

## 2019-12-26 DIAGNOSIS — J9600 Acute respiratory failure, unspecified whether with hypoxia or hypercapnia: Secondary | ICD-10-CM | POA: Diagnosis not present

## 2019-12-26 DIAGNOSIS — J1282 Pneumonia due to coronavirus disease 2019: Secondary | ICD-10-CM | POA: Diagnosis not present

## 2019-12-26 DIAGNOSIS — U071 COVID-19: Secondary | ICD-10-CM | POA: Diagnosis not present

## 2019-12-26 LAB — CBC WITH DIFFERENTIAL/PLATELET
Abs Immature Granulocytes: 0.02 10*3/uL (ref 0.00–0.07)
Basophils Absolute: 0 10*3/uL (ref 0.0–0.1)
Basophils Relative: 0 %
Eosinophils Absolute: 0 10*3/uL (ref 0.0–0.5)
Eosinophils Relative: 0 %
HCT: 35.2 % — ABNORMAL LOW (ref 36.0–46.0)
Hemoglobin: 11.7 g/dL — ABNORMAL LOW (ref 12.0–15.0)
Immature Granulocytes: 0 %
Lymphocytes Relative: 21 %
Lymphs Abs: 1.1 10*3/uL (ref 0.7–4.0)
MCH: 31 pg (ref 26.0–34.0)
MCHC: 33.2 g/dL (ref 30.0–36.0)
MCV: 93.1 fL (ref 80.0–100.0)
Monocytes Absolute: 0.6 10*3/uL (ref 0.1–1.0)
Monocytes Relative: 12 %
Neutro Abs: 3.7 10*3/uL (ref 1.7–7.7)
Neutrophils Relative %: 67 %
Platelets: 235 10*3/uL (ref 150–400)
RBC: 3.78 MIL/uL — ABNORMAL LOW (ref 3.87–5.11)
RDW: 13.2 % (ref 11.5–15.5)
Smear Review: NORMAL
WBC: 5.5 10*3/uL (ref 4.0–10.5)
nRBC: 0 % (ref 0.0–0.2)

## 2019-12-26 LAB — COMPREHENSIVE METABOLIC PANEL
ALT: 37 U/L (ref 0–44)
AST: 32 U/L (ref 15–41)
Albumin: 2.7 g/dL — ABNORMAL LOW (ref 3.5–5.0)
Alkaline Phosphatase: 28 U/L — ABNORMAL LOW (ref 38–126)
Anion gap: 8 (ref 5–15)
BUN: 17 mg/dL (ref 6–20)
CO2: 24 mmol/L (ref 22–32)
Calcium: 8.1 mg/dL — ABNORMAL LOW (ref 8.9–10.3)
Chloride: 108 mmol/L (ref 98–111)
Creatinine, Ser: 0.53 mg/dL (ref 0.44–1.00)
GFR, Estimated: >60 mL/min (ref >60)
Glucose, Bld: 157 mg/dL — ABNORMAL HIGH (ref 70–99)
Potassium: 4 mmol/L (ref 3.5–5.1)
Sodium: 140 mmol/L (ref 135–145)
Total Bilirubin: 0.4 mg/dL (ref 0.3–1.2)
Total Protein: 5.8 g/dL — ABNORMAL LOW (ref 6.5–8.1)

## 2019-12-26 LAB — MAGNESIUM: Magnesium: 2.3 mg/dL (ref 1.7–2.4)

## 2019-12-26 LAB — FIBRIN DERIVATIVES D-DIMER (ARMC ONLY): Fibrin derivatives D-dimer (ARMC): 653.37 ng/mL (FEU) — ABNORMAL HIGH (ref 0.00–499.00)

## 2019-12-26 LAB — C-REACTIVE PROTEIN: CRP: 2.2 mg/dL — ABNORMAL HIGH (ref <1.0)

## 2019-12-26 LAB — FERRITIN: Ferritin: 492 ng/mL — ABNORMAL HIGH (ref 11–307)

## 2019-12-26 LAB — PHOSPHORUS: Phosphorus: 3.6 mg/dL (ref 2.5–4.6)

## 2019-12-26 MED ORDER — LIDOCAINE 5 % EX PTCH
1.0000 | MEDICATED_PATCH | CUTANEOUS | Status: DC
  Administered 2019-12-27 – 2019-12-28 (×3): 1 via TRANSDERMAL
  Filled 2019-12-26 (×3): qty 1

## 2019-12-26 NOTE — Progress Notes (Signed)
Progress Note    Kristen Mcpherson  VXB:[id number] DOB: 10/03/1980  DOA: 12/24/2019 PCP: Doren Custard, FNP      Brief Narrative:    Medical records reviewed and are as summarized below:  Kristen Mcpherson is a 39 y.o. female with medical history significant for anxiety, asthma, who presented to the hospital because of increasing shortness of breath, generalized weakness and fatigue..  She said she had been feeling sick for about 9 days prior to coming to the ED.  She had fever, myalgia, dry cough, anorexia, nausea, vomiting and diarrhea.  She tested positive for COVID-19 infection on 12/18/2019.  She had been taking Tylenol and Motrin without relief.  She was hypoxic in the emergency room with oxygen saturation of 87 to 89% on room air.  She was found to have COVID-19 pneumonia complicated by acute hypoxic respiratory failure.    Assessment/Plan:   Principal Problem:   Pneumonia due to COVID-19 virus Active Problems:   Anxiety and depression   Acute respiratory failure due to COVID-19 (HCC)    Body mass index is 29.86 kg/m.  (Overweight)   COVID-19 pneumonia: Continue IV remdesivir and IV dexamethasone  Acute hypoxic respiratory failure: She is on 3 L/min oxygen via nasal cannula (down from 4 L/min).  Taper off oxygen as able..  Hypotension, dehydration: BP stable.  Diarrhea has improved.  Discontinue IV fluids.  COVID-19 gastroenteritis: Imodium as needed for diarrhea.  Antiemetics as needed for nausea/vomiting.  Anxiety: She said she no longer takes psychotropics.   Diet Order            Diet regular Room service appropriate? Yes; Fluid consistency: Thin  Diet effective now                    Consultants:  None  Procedures:  None    Medications:   . albuterol  2 puff Inhalation Q6H  . vitamin C  500 mg Oral Daily  . dexamethasone  6 mg Oral Q24H  . docusate sodium  100 mg Oral BID  . enoxaparin (LOVENOX) injection  40 mg Subcutaneous Q24H   . sodium chloride flush  3 mL Intravenous Q12H  . zinc sulfate  220 mg Oral Daily   Continuous Infusions: . sodium chloride    . lactated ringers 75 mL/hr at 12/25/19 1500  . remdesivir 100 mg in NS 100 mL 100 mg (12/26/19 0845)     Anti-infectives (From admission, onward)   Start     Dose/Rate Route Frequency Ordered Stop   12/25/19 1000  remdesivir 100 mg in sodium chloride 0.9 % 100 mL IVPB       "Followed by" Linked Group Details   100 mg 200 mL/hr over 30 Minutes Intravenous Daily 12/24/19 1200 12/29/19 0959   12/25/19 1000  remdesivir 100 mg in sodium chloride 0.9 % 100 mL IVPB  Status:  Discontinued       "Followed by" Linked Group Details   100 mg 200 mL/hr over 30 Minutes Intravenous Daily 12/24/19 1314 12/24/19 1317   12/24/19 1315  remdesivir 200 mg in sodium chloride 0.9% 250 mL IVPB  Status:  Discontinued       "Followed by" Linked Group Details   200 mg 580 mL/hr over 30 Minutes Intravenous Once 12/24/19 1314 12/24/19 1317   12/24/19 1300  remdesivir 200 mg in sodium chloride 0.9% 250 mL IVPB       "Followed by" Linked Group Details   200 mg  580 mL/hr over 30 Minutes Intravenous Once 12/24/19 1200 12/24/19 1553   12/24/19 0930  doxycycline (VIBRA-TABS) tablet 100 mg        100 mg Oral  Once 12/24/19 7858 12/24/19 0946             Family Communication/Anticipated D/C date and plan/Code Status   DVT prophylaxis: enoxaparin (LOVENOX) injection 40 mg Start: 12/24/19 1400     Code Status: Full Code  Family Communication: None Disposition Plan:    Status is: Inpatient  Remains inpatient appropriate because:IV treatments appropriate due to intensity of illness or inability to take PO and Inpatient level of care appropriate due to severity of illness   Dispo: The patient is from: Home              Anticipated d/c is to: Home              Anticipated d/c date is: > 3 days              Patient currently is not medically stable to  d/c.           Subjective:   C/o shortness of breath with minimal exertion.  No diarrhea today.  Objective:    Vitals:   12/26/19 0136 12/26/19 0541 12/26/19 0551 12/26/19 0822  BP: (!) 92/51 (!) 92/47 98/65 96/63   Pulse: (!) 54 65 75 66  Resp: 16 16 16 16   Temp: 97.7 F (36.5 C) (!) 97.5 F (36.4 C) (!) 97.5 F (36.4 C) 98.8 F (37.1 C)  TempSrc: Oral Oral Oral   SpO2: 97% 95% 94% 97%  Weight:      Height:       No data found.  No intake or output data in the 24 hours ending 12/26/19 1111 Filed Weights   12/24/19 0513  Weight: 83.9 kg    Exam:   GEN: NAD SKIN: No rash EYES: EOMI ENT: MMM CV: RRR PULM: Bibasilar rales.  No wheezing heard.   ABD: soft, obese, NT, +BS CNS: AAO x 3, non focal EXT: No edema or tenderness      Data Reviewed:   I have personally reviewed following labs and imaging studies:  Labs: Labs show the following:   Basic Metabolic Panel: Recent Labs  Lab 12/24/19 0521 12/25/19 0505 12/26/19 0445  NA 136 141 140  K 4.5 4.0 4.0  CL 102 108 108  CO2 25 25 24   GLUCOSE 178* 152* 157*  BUN 12 15 17   CREATININE 0.60 0.51 0.53  CALCIUM 8.2* 7.9* 8.1*  MG  --  2.4 2.3  PHOS  --  3.5 3.6   GFR Estimated Creatinine Clearance: 105 mL/min (by C-G formula based on SCr of 0.53 mg/dL). Liver Function Tests: Recent Labs  Lab 12/24/19 0521 12/25/19 0505 12/26/19 0445  AST 42* 56* 32  ALT 33 51* 37  ALKPHOS 35* 30* 28*  BILITOT 0.4 0.3 0.4  PROT 6.5 6.3* 5.8*  ALBUMIN 3.2* 2.9* 2.7*   No results for input(s): LIPASE, AMYLASE in the last 168 hours. No results for input(s): AMMONIA in the last 168 hours. Coagulation profile No results for input(s): INR, PROTIME in the last 168 hours.  CBC: Recent Labs  Lab 12/24/19 0521 12/25/19 0505 12/26/19 0445  WBC 5.7 2.3* 5.5  NEUTROABS 4.9 1.3* 3.7  HGB 12.4 12.2 11.7*  HCT 36.7 37.1 35.2*  MCV 92.7 93.9 93.1  PLT 174 174 235   Cardiac Enzymes: No results for  input(s): CKTOTAL,  CKMB, CKMBINDEX, TROPONINI in the last 168 hours. BNP (last 3 results) No results for input(s): PROBNP in the last 8760 hours. CBG: No results for input(s): GLUCAP in the last 168 hours. D-Dimer: No results for input(s): DDIMER in the last 72 hours. Hgb A1c: No results for input(s): HGBA1C in the last 72 hours. Lipid Profile: No results for input(s): CHOL, HDL, LDLCALC, TRIG, CHOLHDL, LDLDIRECT in the last 72 hours. Thyroid function studies: No results for input(s): TSH, T4TOTAL, T3FREE, THYROIDAB in the last 72 hours.  Invalid input(s): FREET3 Anemia work up: Recent Labs    12/25/19 0505 12/26/19 0445  FERRITIN 425* 492*   Sepsis Labs: Recent Labs  Lab 12/24/19 0521 12/25/19 0505 12/26/19 0445  PROCALCITON <0.10  --   --   WBC 5.7 2.3* 5.5    Microbiology No results found for this or any previous visit (from the past 240 hour(s)).  Procedures and diagnostic studies:  No results found.             LOS: 2 days   Latanga Nedrow  [facility]   Pager on [email or web address]. If 7PM-7AM, please contact night-coverage at [email or web address]     12/26/2019, 11:11 AM

## 2019-12-27 DIAGNOSIS — J1282 Pneumonia due to coronavirus disease 2019: Secondary | ICD-10-CM | POA: Diagnosis not present

## 2019-12-27 DIAGNOSIS — J9600 Acute respiratory failure, unspecified whether with hypoxia or hypercapnia: Secondary | ICD-10-CM | POA: Diagnosis not present

## 2019-12-27 DIAGNOSIS — U071 COVID-19: Secondary | ICD-10-CM | POA: Diagnosis not present

## 2019-12-27 LAB — CBC WITH DIFFERENTIAL/PLATELET
Abs Immature Granulocytes: 0.02 10*3/uL (ref 0.00–0.07)
Basophils Absolute: 0 10*3/uL (ref 0.0–0.1)
Basophils Relative: 0 %
Eosinophils Absolute: 0 10*3/uL (ref 0.0–0.5)
Eosinophils Relative: 0 %
HCT: 35.6 % — ABNORMAL LOW (ref 36.0–46.0)
Hemoglobin: 11.8 g/dL — ABNORMAL LOW (ref 12.0–15.0)
Immature Granulocytes: 0 %
Lymphocytes Relative: 25 %
Lymphs Abs: 1.7 10*3/uL (ref 0.7–4.0)
MCH: 31.1 pg (ref 26.0–34.0)
MCHC: 33.1 g/dL (ref 30.0–36.0)
MCV: 93.7 fL (ref 80.0–100.0)
Monocytes Absolute: 0.9 10*3/uL (ref 0.1–1.0)
Monocytes Relative: 14 %
Neutro Abs: 4.1 10*3/uL (ref 1.7–7.7)
Neutrophils Relative %: 61 %
Platelets: 261 10*3/uL (ref 150–400)
RBC: 3.8 MIL/uL — ABNORMAL LOW (ref 3.87–5.11)
RDW: 13.1 % (ref 11.5–15.5)
Smear Review: NORMAL
WBC: 6.7 10*3/uL (ref 4.0–10.5)
nRBC: 0 % (ref 0.0–0.2)

## 2019-12-27 LAB — COMPREHENSIVE METABOLIC PANEL
ALT: 32 U/L (ref 0–44)
AST: 23 U/L (ref 15–41)
Albumin: 2.9 g/dL — ABNORMAL LOW (ref 3.5–5.0)
Alkaline Phosphatase: 26 U/L — ABNORMAL LOW (ref 38–126)
Anion gap: 8 (ref 5–15)
BUN: 18 mg/dL (ref 6–20)
CO2: 25 mmol/L (ref 22–32)
Calcium: 8 mg/dL — ABNORMAL LOW (ref 8.9–10.3)
Chloride: 107 mmol/L (ref 98–111)
Creatinine, Ser: 0.59 mg/dL (ref 0.44–1.00)
GFR, Estimated: >60 mL/min (ref >60)
Glucose, Bld: 141 mg/dL — ABNORMAL HIGH (ref 70–99)
Potassium: 4.2 mmol/L (ref 3.5–5.1)
Sodium: 140 mmol/L (ref 135–145)
Total Bilirubin: 0.4 mg/dL (ref 0.3–1.2)
Total Protein: 5.9 g/dL — ABNORMAL LOW (ref 6.5–8.1)

## 2019-12-27 LAB — FERRITIN: Ferritin: 429 ng/mL — ABNORMAL HIGH (ref 11–307)

## 2019-12-27 LAB — MAGNESIUM: Magnesium: 2.3 mg/dL (ref 1.7–2.4)

## 2019-12-27 LAB — PHOSPHORUS: Phosphorus: 3.6 mg/dL (ref 2.5–4.6)

## 2019-12-27 LAB — FIBRIN DERIVATIVES D-DIMER (ARMC ONLY): Fibrin derivatives D-dimer (ARMC): 549.74 ng/mL (FEU) — ABNORMAL HIGH (ref 0.00–499.00)

## 2019-12-27 LAB — C-REACTIVE PROTEIN: CRP: 0.6 mg/dL (ref <1.0)

## 2019-12-27 MED ORDER — BENZONATATE 100 MG PO CAPS
100.0000 mg | ORAL_CAPSULE | Freq: Three times a day (TID) | ORAL | Status: DC | PRN
  Administered 2019-12-27 – 2019-12-29 (×3): 100 mg via ORAL
  Filled 2019-12-27 (×4): qty 1

## 2019-12-27 NOTE — Progress Notes (Addendum)
Progress Note    Milliana Reddoch  CZY:[id number] DOB: 03/17/1980  DOA: 12/24/2019 PCP: Doren Custard, FNP      Brief Narrative:    Medical records reviewed and are as summarized below:  Dearia Wilmouth is a 39 y.o. female with medical history significant for anxiety, asthma, who presented to the hospital because of increasing shortness of breath, generalized weakness and fatigue..  She said she had been feeling sick for about 9 days prior to coming to the ED.  She had fever, myalgia, dry cough, anorexia, nausea, vomiting and diarrhea.  She tested positive for COVID-19 infection on 12/18/2019.  She had been taking Tylenol and Motrin without relief.  She was hypoxic in the emergency room with oxygen saturation of 87 to 89% on room air.  She was found to have COVID-19 pneumonia complicated by acute hypoxic respiratory failure.    Assessment/Plan:   Principal Problem:   Pneumonia due to COVID-19 virus Active Problems:   Anxiety and depression   Acute respiratory failure due to COVID-19 (HCC)    Body mass index is 29.86 kg/m.  (Overweight)   COVID-19 pneumonia: Continue IV remdesivir and IV dexamethasone.  Tessalon Perles as needed for cough.  Acute hypoxic respiratory failure: She is down to 2 L/min oxygen via nasal cannula (from 3 L/min).  Taper off oxygen as able.  Hypotension, dehydration: BP still low but she is asymptomatic.  Monitor BP off of IV fluids.   Sinus bradycardia: Asymptomatic.  Monitor on telemetry.  COVID-19 gastroenteritis: Resolved.    Anxiety: She said she no longer takes psychotropics.   Diet Order            Diet regular Room service appropriate? Yes; Fluid consistency: Thin  Diet effective now                    Consultants:  None  Procedures:  None    Medications:   . albuterol  2 puff Inhalation Q6H  . vitamin C  500 mg Oral Daily  . dexamethasone  6 mg Oral Q24H  . docusate sodium  100 mg Oral BID  . enoxaparin  (LOVENOX) injection  40 mg Subcutaneous Q24H  . lidocaine  1 patch Transdermal Q24H  . sodium chloride flush  3 mL Intravenous Q12H  . zinc sulfate  220 mg Oral Daily   Continuous Infusions: . sodium chloride    . remdesivir 100 mg in NS 100 mL Stopped (12/26/19 1701)     Anti-infectives (From admission, onward)   Start     Dose/Rate Route Frequency Ordered Stop   12/25/19 1000  remdesivir 100 mg in sodium chloride 0.9 % 100 mL IVPB       "Followed by" Linked Group Details   100 mg 200 mL/hr over 30 Minutes Intravenous Daily 12/24/19 1200 12/29/19 0959   12/25/19 1000  remdesivir 100 mg in sodium chloride 0.9 % 100 mL IVPB  Status:  Discontinued       "Followed by" Linked Group Details   100 mg 200 mL/hr over 30 Minutes Intravenous Daily 12/24/19 1314 12/24/19 1317   12/24/19 1315  remdesivir 200 mg in sodium chloride 0.9% 250 mL IVPB  Status:  Discontinued       "Followed by" Linked Group Details   200 mg 580 mL/hr over 30 Minutes Intravenous Once 12/24/19 1314 12/24/19 1317   12/24/19 1300  remdesivir 200 mg in sodium chloride 0.9% 250 mL IVPB       "  Followed by" Linked Group Details   200 mg 580 mL/hr over 30 Minutes Intravenous Once 12/24/19 1200 12/24/19 1553   12/24/19 0930  doxycycline (VIBRA-TABS) tablet 100 mg        100 mg Oral  Once 12/24/19 8676 12/24/19 0946             Family Communication/Anticipated D/C date and plan/Code Status   DVT prophylaxis: enoxaparin (LOVENOX) injection 40 mg Start: 12/24/19 1400     Code Status: Full Code  Family Communication: None Disposition Plan:    Status is: Inpatient  Remains inpatient appropriate because:IV treatments appropriate due to intensity of illness or inability to take PO and Inpatient level of care appropriate due to severity of illness   Dispo: The patient is from: Home              Anticipated d/c is to: Home              Anticipated d/c date is: > 3 days              Patient currently is not  medically stable to d/c.           Subjective:   C/o cough and shortness of breath with minimal exertion.  She feels a little better today compared to yesterday.  No diarrhea.  Objective:    Vitals:   12/26/19 2003 12/27/19 0138 12/27/19 0516 12/27/19 0948  BP: (!) 95/57 96/75 (!) 96/55 (!) 93/55  Pulse: (!) 55  (!) 57 63  Resp: 15  14 16   Temp: 97.6 F (36.4 C) 98.1 F (36.7 C) 98 F (36.7 C) 98.5 F (36.9 C)  TempSrc: Oral  Oral   SpO2: 99%  97% 92%  Weight:      Height:       No data found.   Intake/Output Summary (Last 24 hours) at 12/27/2019 1110 Last data filed at 12/26/2019 1701 Gross per 24 hour  Intake 100 ml  Output --  Net 100 ml   Filed Weights   12/24/19 0513  Weight: 83.9 kg    Exam:  GEN: NAD SKIN: Warm and dry EYES: No pallor or icterus ENT: MMM CV: RRR PULM: Bibasilar rales.  No wheezing.  Air entry adequate bilaterally. ABD: soft, obese, NT, +BS CNS: AAO x 3, non focal EXT: No edema or tenderness     Data Reviewed:   I have personally reviewed following labs and imaging studies:  Labs: Labs show the following:   Basic Metabolic Panel: Recent Labs  Lab 12/24/19 0521 12/25/19 0505 12/26/19 0445 12/27/19 0401  NA 136 141 140 140  K 4.5 4.0 4.0 4.2  CL 102 108 108 107  CO2 25 25 24 25   GLUCOSE 178* 152* 157* 141*  BUN 12 15 17 18   CREATININE 0.60 0.51 0.53 0.59  CALCIUM 8.2* 7.9* 8.1* 8.0*  MG  --  2.4 2.3 2.3  PHOS  --  3.5 3.6 3.6   GFR Estimated Creatinine Clearance: 105 mL/min (by C-G formula based on SCr of 0.59 mg/dL). Liver Function Tests: Recent Labs  Lab 12/24/19 0521 12/25/19 0505 12/26/19 0445 12/27/19 0401  AST 42* 56* 32 23  ALT 33 51* 37 32  ALKPHOS 35* 30* 28* 26*  BILITOT 0.4 0.3 0.4 0.4  PROT 6.5 6.3* 5.8* 5.9*  ALBUMIN 3.2* 2.9* 2.7* 2.9*   No results for input(s): LIPASE, AMYLASE in the last 168 hours. No results for input(s): AMMONIA in the last 168 hours. Coagulation  profile No results for input(s): INR, PROTIME in the last 168 hours.  CBC: Recent Labs  Lab 12/24/19 0521 12/25/19 0505 12/26/19 0445 12/27/19 0401  WBC 5.7 2.3* 5.5 6.7  NEUTROABS 4.9 1.3* 3.7 4.1  HGB 12.4 12.2 11.7* 11.8*  HCT 36.7 37.1 35.2* 35.6*  MCV 92.7 93.9 93.1 93.7  PLT 174 174 235 261   Cardiac Enzymes: No results for input(s): CKTOTAL, CKMB, CKMBINDEX, TROPONINI in the last 168 hours. BNP (last 3 results) No results for input(s): PROBNP in the last 8760 hours. CBG: No results for input(s): GLUCAP in the last 168 hours. D-Dimer: No results for input(s): DDIMER in the last 72 hours. Hgb A1c: No results for input(s): HGBA1C in the last 72 hours. Lipid Profile: No results for input(s): CHOL, HDL, LDLCALC, TRIG, CHOLHDL, LDLDIRECT in the last 72 hours. Thyroid function studies: No results for input(s): TSH, T4TOTAL, T3FREE, THYROIDAB in the last 72 hours.  Invalid input(s): FREET3 Anemia work up: Recent Labs    12/26/19 0445 12/27/19 0401  FERRITIN 492* 429*   Sepsis Labs: Recent Labs  Lab 12/24/19 0521 12/25/19 0505 12/26/19 0445 12/27/19 0401  PROCALCITON <0.10  --   --   --   WBC 5.7 2.3* 5.5 6.7    Microbiology No results found for this or any previous visit (from the past 240 hour(s)).  Procedures and diagnostic studies:  No results found.             LOS: 3 days   Zavior Thomason  [facility]   Pager on [email or web address]. If 7PM-7AM, please contact night-coverage at [email or web address]     12/27/2019, 11:10 AM

## 2019-12-28 DIAGNOSIS — F419 Anxiety disorder, unspecified: Secondary | ICD-10-CM | POA: Diagnosis not present

## 2019-12-28 DIAGNOSIS — J9600 Acute respiratory failure, unspecified whether with hypoxia or hypercapnia: Secondary | ICD-10-CM | POA: Diagnosis not present

## 2019-12-28 DIAGNOSIS — U071 COVID-19: Secondary | ICD-10-CM | POA: Diagnosis not present

## 2019-12-28 DIAGNOSIS — J1282 Pneumonia due to coronavirus disease 2019: Secondary | ICD-10-CM | POA: Diagnosis not present

## 2019-12-28 LAB — COMPREHENSIVE METABOLIC PANEL
ALT: 37 U/L (ref 0–44)
AST: 25 U/L (ref 15–41)
Albumin: 2.8 g/dL — ABNORMAL LOW (ref 3.5–5.0)
Alkaline Phosphatase: 28 U/L — ABNORMAL LOW (ref 38–126)
Anion gap: 6 (ref 5–15)
BUN: 19 mg/dL (ref 6–20)
CO2: 26 mmol/L (ref 22–32)
Calcium: 8.2 mg/dL — ABNORMAL LOW (ref 8.9–10.3)
Chloride: 106 mmol/L (ref 98–111)
Creatinine, Ser: 0.44 mg/dL (ref 0.44–1.00)
GFR, Estimated: >60 mL/min (ref >60)
Glucose, Bld: 133 mg/dL — ABNORMAL HIGH (ref 70–99)
Potassium: 3.7 mmol/L (ref 3.5–5.1)
Sodium: 138 mmol/L (ref 135–145)
Total Bilirubin: 0.4 mg/dL (ref 0.3–1.2)
Total Protein: 5.8 g/dL — ABNORMAL LOW (ref 6.5–8.1)

## 2019-12-28 LAB — CBC WITH DIFFERENTIAL/PLATELET
Abs Immature Granulocytes: 0.05 10*3/uL (ref 0.00–0.07)
Basophils Absolute: 0 10*3/uL (ref 0.0–0.1)
Basophils Relative: 0 %
Eosinophils Absolute: 0 10*3/uL (ref 0.0–0.5)
Eosinophils Relative: 0 %
HCT: 35.7 % — ABNORMAL LOW (ref 36.0–46.0)
Hemoglobin: 11.7 g/dL — ABNORMAL LOW (ref 12.0–15.0)
Immature Granulocytes: 1 %
Lymphocytes Relative: 20 %
Lymphs Abs: 2 10*3/uL (ref 0.7–4.0)
MCH: 30.7 pg (ref 26.0–34.0)
MCHC: 32.8 g/dL (ref 30.0–36.0)
MCV: 93.7 fL (ref 80.0–100.0)
Monocytes Absolute: 1.4 10*3/uL — ABNORMAL HIGH (ref 0.1–1.0)
Monocytes Relative: 13 %
Neutro Abs: 6.9 10*3/uL (ref 1.7–7.7)
Neutrophils Relative %: 66 %
Platelets: 294 10*3/uL (ref 150–400)
RBC: 3.81 MIL/uL — ABNORMAL LOW (ref 3.87–5.11)
RDW: 13 % (ref 11.5–15.5)
Smear Review: NORMAL
WBC: 10.4 10*3/uL (ref 4.0–10.5)
nRBC: 0 % (ref 0.0–0.2)

## 2019-12-28 LAB — FERRITIN: Ferritin: 339 ng/mL — ABNORMAL HIGH (ref 11–307)

## 2019-12-28 LAB — C-REACTIVE PROTEIN: CRP: 0.8 mg/dL (ref <1.0)

## 2019-12-28 LAB — MAGNESIUM: Magnesium: 2 mg/dL (ref 1.7–2.4)

## 2019-12-28 LAB — PHOSPHORUS: Phosphorus: 3.2 mg/dL (ref 2.5–4.6)

## 2019-12-28 LAB — FIBRIN DERIVATIVES D-DIMER (ARMC ONLY): Fibrin derivatives D-dimer (ARMC): 390.25 ng/mL (FEU) (ref 0.00–499.00)

## 2019-12-28 MED ORDER — SODIUM CHLORIDE 0.9 % IV BOLUS
500.0000 mL | Freq: Once | INTRAVENOUS | Status: AC
  Administered 2019-12-28: 500 mL via INTRAVENOUS

## 2019-12-28 MED ORDER — SODIUM CHLORIDE 0.9 % IV SOLN: INTRAVENOUS | Status: AC

## 2019-12-28 NOTE — Progress Notes (Signed)
Previously yellow. Continuing q 4 VS.   12/28/19 0438  Assess: MEWS Score  Temp 98.5 F (36.9 C)  BP (!) 80/43  Pulse Rate (!) 55  Resp 20  SpO2 95 %  Assess: MEWS Score  MEWS Temp 0  MEWS Systolic 2  MEWS Pulse 0  MEWS RR 0  MEWS LOC 0  MEWS Score 2  MEWS Score Color Yellow  Assess: if the MEWS score is Yellow or Red  Were vital signs taken at a resting state? Yes  Focused Assessment No change from prior assessment  Early Detection of Sepsis Score *See Row Information* Low  MEWS guidelines implemented *See Row Information* No, previously yellow, continue vital signs every 4 hours  Treat  MEWS Interventions Other (Comment) (continuing to monitor)

## 2019-12-28 NOTE — Progress Notes (Signed)
   12/28/19 0043  Assess: MEWS Score  Temp 97.7 F (36.5 C)  BP (!) 95/59  Pulse Rate (!) 48  Resp (!) 24  SpO2 97 %  O2 Device Room Air  Assess: MEWS Score  MEWS Temp 0  MEWS Systolic 1  MEWS Pulse 1  MEWS RR 1  MEWS LOC 0  MEWS Score 3  MEWS Score Color Yellow  Assess: if the MEWS score is Yellow or Red  Were vital signs taken at a resting state? Yes  Focused Assessment No change from prior assessment  Early Detection of Sepsis Score *See Row Information* Low  MEWS guidelines implemented *See Row Information* Yes  Treat  MEWS Interventions Other (Comment) (continuing to monitor)  Take Vital Signs  Increase Vital Sign Frequency  Yellow: Q 2hr X 2 then Q 4hr X 2, if remains yellow, continue Q 4hrs  Escalate  MEWS: Escalate Yellow: discuss with charge nurse/RN and consider discussing with provider and RRT  Notify: Charge Nurse/RN  Name of Charge Nurse/RN Notified Marny Lowenstein RN  Date Charge Nurse/RN Notified 12/28/19  Time Charge Nurse/RN Notified 0100  Document  Patient Outcome  (continuing to monitor)  Progress note created (see row info) Yes

## 2019-12-28 NOTE — Progress Notes (Addendum)
Progress Note    Kristen Mcpherson  NWG:[id number] DOB: 03/22/1980  DOA: 12/24/2019 PCP: Doren Custard, FNP      Brief Narrative:    Medical records reviewed and are as summarized below:  Kristen Mcpherson is a 39 y.o. female with medical history significant for anxiety, asthma, who presented to the hospital because of increasing shortness of breath, generalized weakness and fatigue..  She said she had been feeling sick for about 9 days prior to coming to the ED.  She had fever, myalgia, dry cough, anorexia, nausea, vomiting and diarrhea.  She tested positive for COVID-19 infection on 12/18/2019.  She had been taking Tylenol and Motrin without relief.  She was hypoxic in the emergency room with oxygen saturation of 87 to 89% on room air.  She was found to have COVID-19 pneumonia complicated by acute hypoxic respiratory failure.    Assessment/Plan:   Principal Problem:   Pneumonia due to COVID-19 virus Active Problems:   Anxiety and depression   Acute respiratory failure due to COVID-19 (HCC)    Body mass index is 29.86 kg/m.  (Overweight)   COVID-19 pneumonia: Continue IV dexamethasone.  She will complete IV remdesivir today.  Antitussives as needed for cough.  Acute hypoxic respiratory failure: Resolved.  She is tolerating room air.  (from 3 L/min).    Hypotension, dehydration: BP dropped to 80/43.  Treat with normal saline bolus followed by normal saline infusion..  Monitor BP closely.   Sinus bradycardia: Asymptomatic.  Monitor on telemetry.  COVID-19 gastroenteritis: Resolved.    Anxiety: She said she no longer takes psychotropics.  Plan to discharge home tomorrow.  Encourage ambulation.   Diet Order            Diet regular Room service appropriate? Yes; Fluid consistency: Thin  Diet effective now                    Consultants:  None  Procedures:  None    Medications:   . albuterol  2 puff Inhalation Q6H  . vitamin C  500 mg Oral Daily   . dexamethasone  6 mg Oral Q24H  . docusate sodium  100 mg Oral BID  . enoxaparin (LOVENOX) injection  40 mg Subcutaneous Q24H  . lidocaine  1 patch Transdermal Q24H  . sodium chloride flush  3 mL Intravenous Q12H  . zinc sulfate  220 mg Oral Daily   Continuous Infusions: . sodium chloride    . sodium chloride    . remdesivir 100 mg in NS 100 mL 100 mg (12/27/19 1242)     Anti-infectives (From admission, onward)   Start     Dose/Rate Route Frequency Ordered Stop   12/25/19 1000  remdesivir 100 mg in sodium chloride 0.9 % 100 mL IVPB       "Followed by" Linked Group Details   100 mg 200 mL/hr over 30 Minutes Intravenous Daily 12/24/19 1200 12/29/19 0959   12/25/19 1000  remdesivir 100 mg in sodium chloride 0.9 % 100 mL IVPB  Status:  Discontinued       "Followed by" Linked Group Details   100 mg 200 mL/hr over 30 Minutes Intravenous Daily 12/24/19 1314 12/24/19 1317   12/24/19 1315  remdesivir 200 mg in sodium chloride 0.9% 250 mL IVPB  Status:  Discontinued       "Followed by" Linked Group Details   200 mg 580 mL/hr over 30 Minutes Intravenous Once 12/24/19 1314 12/24/19 1317  12/24/19 1300  remdesivir 200 mg in sodium chloride 0.9% 250 mL IVPB       "Followed by" Linked Group Details   200 mg 580 mL/hr over 30 Minutes Intravenous Once 12/24/19 1200 12/24/19 1553   12/24/19 0930  doxycycline (VIBRA-TABS) tablet 100 mg        100 mg Oral  Once 12/24/19 8828 12/24/19 0946             Family Communication/Anticipated D/C date and plan/Code Status   DVT prophylaxis: enoxaparin (LOVENOX) injection 40 mg Start: 12/24/19 1400     Code Status: Full Code  Family Communication: None Disposition Plan:    Status is: Inpatient  Remains inpatient appropriate because:IV treatments appropriate due to intensity of illness or inability to take PO and Inpatient level of care appropriate due to severity of illness   Dispo: The patient is from: Home              Anticipated  d/c is to: Home              Anticipated d/c date is: > 3 days              Patient currently is not medically stable to d/c.           Subjective:   Interval events noted.  C/o cough and generalized weakness.  Breathing is a little better.  No vomiting or diarrhea.  Objective:    Vitals:   12/28/19 0100 12/28/19 0247 12/28/19 0438 12/28/19 0836  BP:  (!) 84/53 (!) 80/43 (!) 102/54  Pulse: (!) 51 67 (!) 55 67  Resp:  18 20 16   Temp:   98.5 F (36.9 C) 98 F (36.7 C)  TempSrc:   Oral Oral  SpO2:  95% 95% 95%  Weight:      Height:       No data found.  No intake or output data in the 24 hours ending 12/28/19 1047 Filed Weights   12/24/19 0513  Weight: 83.9 kg    Exam:  GEN: NAD SKIN: Warm and dry EYES: EOMI ENT: MMM CV: RRR PULM: Bibasilar rales.  No wheezing heard. ABD: soft, obese, NT, +BS CNS: AAO x 3, non focal EXT: No edema or tenderness     Data Reviewed:   I have personally reviewed following labs and imaging studies:  Labs: Labs show the following:   Basic Metabolic Panel: Recent Labs  Lab 12/24/19 0521 12/25/19 0505 12/26/19 0445 12/27/19 0401 12/28/19 0422  NA 136 141 140 140 138  K 4.5 4.0 4.0 4.2 3.7  CL 102 108 108 107 106  CO2 25 25 24 25 26   GLUCOSE 178* 152* 157* 141* 133*  BUN 12 15 17 18 19   CREATININE 0.60 0.51 0.53 0.59 0.44  CALCIUM 8.2* 7.9* 8.1* 8.0* 8.2*  MG  --  2.4 2.3 2.3 2.0  PHOS  --  3.5 3.6 3.6 3.2   GFR Estimated Creatinine Clearance: 105 mL/min (by C-G formula based on SCr of 0.44 mg/dL). Liver Function Tests: Recent Labs  Lab 12/24/19 0521 12/25/19 0505 12/26/19 0445 12/27/19 0401 12/28/19 0422  AST 42* 56* 32 23 25  ALT 33 51* 37 32 37  ALKPHOS 35* 30* 28* 26* 28*  BILITOT 0.4 0.3 0.4 0.4 0.4  PROT 6.5 6.3* 5.8* 5.9* 5.8*  ALBUMIN 3.2* 2.9* 2.7* 2.9* 2.8*   No results for input(s): LIPASE, AMYLASE in the last 168 hours. No results for input(s): AMMONIA in the  last 168  hours. Coagulation profile No results for input(s): INR, PROTIME in the last 168 hours.  CBC: Recent Labs  Lab 12/24/19 0521 12/25/19 0505 12/26/19 0445 12/27/19 0401 12/28/19 0422  WBC 5.7 2.3* 5.5 6.7 10.4  NEUTROABS 4.9 1.3* 3.7 4.1 6.9  HGB 12.4 12.2 11.7* 11.8* 11.7*  HCT 36.7 37.1 35.2* 35.6* 35.7*  MCV 92.7 93.9 93.1 93.7 93.7  PLT 174 174 235 261 294   Cardiac Enzymes: No results for input(s): CKTOTAL, CKMB, CKMBINDEX, TROPONINI in the last 168 hours. BNP (last 3 results) No results for input(s): PROBNP in the last 8760 hours. CBG: No results for input(s): GLUCAP in the last 168 hours. D-Dimer: No results for input(s): DDIMER in the last 72 hours. Hgb A1c: No results for input(s): HGBA1C in the last 72 hours. Lipid Profile: No results for input(s): CHOL, HDL, LDLCALC, TRIG, CHOLHDL, LDLDIRECT in the last 72 hours. Thyroid function studies: No results for input(s): TSH, T4TOTAL, T3FREE, THYROIDAB in the last 72 hours.  Invalid input(s): FREET3 Anemia work up: Recent Labs    12/27/19 0401 12/28/19 0422  FERRITIN 429* 339*   Sepsis Labs: Recent Labs  Lab 12/24/19 0521 12/25/19 0505 12/26/19 0445 12/27/19 0401 12/28/19 0422  PROCALCITON <0.10  --   --   --   --   WBC 5.7 2.3* 5.5 6.7 10.4    Microbiology No results found for this or any previous visit (from the past 240 hour(s)).  Procedures and diagnostic studies:  No results found.             LOS: 4 days   Kristen Mcpherson  [facility]   Pager on [email or web address]. If 7PM-7AM, please contact night-coverage at [email or web address]     12/28/2019, 10:47 AM

## 2019-12-28 NOTE — Progress Notes (Signed)
VS (BP & pulse) consistent with baseline trend from admission. Yellow mews protocol not initiated. Will continue to monitor pt q 4 hours per unit routine.  12/28/19 2054  Assess: MEWS Score  Temp 98.2 F (36.8 C)  BP (!) 95/58  Pulse Rate (!) 50  Resp 18  SpO2 98 %  O2 Device Room Air  Assess: MEWS Score  MEWS Temp 0  MEWS Systolic 1  MEWS Pulse 1  MEWS RR 0  MEWS LOC 0  MEWS Score 2  MEWS Score Color Yellow  Assess: if the MEWS score is Yellow or Red  Were vital signs taken at a resting state? Yes  Focused Assessment No change from prior assessment  Early Detection of Sepsis Score *See Row Information* Low  MEWS guidelines implemented *See Row Information* No, other (Comment) (Pt's baseline trend of VS from admission)  Notify: Charge Nurse/RN  Name of Charge Nurse/RN Notified Phyillis M RN  Date Charge Nurse/RN Notified 12/28/19  Time Charge Nurse/RN Notified 2100  Document  Patient Outcome Other (Comment) (Stable trending VS)  Progress note created (see row info) Yes

## 2019-12-29 DIAGNOSIS — J9600 Acute respiratory failure, unspecified whether with hypoxia or hypercapnia: Secondary | ICD-10-CM | POA: Diagnosis not present

## 2019-12-29 DIAGNOSIS — F419 Anxiety disorder, unspecified: Secondary | ICD-10-CM | POA: Diagnosis not present

## 2019-12-29 DIAGNOSIS — U071 COVID-19: Secondary | ICD-10-CM | POA: Diagnosis not present

## 2019-12-29 DIAGNOSIS — J1282 Pneumonia due to coronavirus disease 2019: Secondary | ICD-10-CM | POA: Diagnosis not present

## 2019-12-29 LAB — CBC WITH DIFFERENTIAL/PLATELET
Abs Immature Granulocytes: 0.07 10*3/uL (ref 0.00–0.07)
Basophils Absolute: 0 10*3/uL (ref 0.0–0.1)
Basophils Relative: 0 %
Eosinophils Absolute: 0 10*3/uL (ref 0.0–0.5)
Eosinophils Relative: 0 %
HCT: 33.3 % — ABNORMAL LOW (ref 36.0–46.0)
Hemoglobin: 11.2 g/dL — ABNORMAL LOW (ref 12.0–15.0)
Immature Granulocytes: 1 %
Lymphocytes Relative: 29 %
Lymphs Abs: 2 10*3/uL (ref 0.7–4.0)
MCH: 31.2 pg (ref 26.0–34.0)
MCHC: 33.6 g/dL (ref 30.0–36.0)
MCV: 92.8 fL (ref 80.0–100.0)
Monocytes Absolute: 1 10*3/uL (ref 0.1–1.0)
Monocytes Relative: 14 %
Neutro Abs: 3.8 10*3/uL (ref 1.7–7.7)
Neutrophils Relative %: 56 %
Platelets: 285 10*3/uL (ref 150–400)
RBC: 3.59 MIL/uL — ABNORMAL LOW (ref 3.87–5.11)
RDW: 13 % (ref 11.5–15.5)
Smear Review: NORMAL
WBC: 6.9 10*3/uL (ref 4.0–10.5)
nRBC: 0 % (ref 0.0–0.2)

## 2019-12-29 LAB — COMPREHENSIVE METABOLIC PANEL
ALT: 39 U/L (ref 0–44)
AST: 20 U/L (ref 15–41)
Albumin: 2.6 g/dL — ABNORMAL LOW (ref 3.5–5.0)
Alkaline Phosphatase: 28 U/L — ABNORMAL LOW (ref 38–126)
Anion gap: 5 (ref 5–15)
BUN: 13 mg/dL (ref 6–20)
CO2: 25 mmol/L (ref 22–32)
Calcium: 7.8 mg/dL — ABNORMAL LOW (ref 8.9–10.3)
Chloride: 107 mmol/L (ref 98–111)
Creatinine, Ser: 0.37 mg/dL — ABNORMAL LOW (ref 0.44–1.00)
GFR, Estimated: >60 mL/min (ref >60)
Glucose, Bld: 127 mg/dL — ABNORMAL HIGH (ref 70–99)
Potassium: 3.7 mmol/L (ref 3.5–5.1)
Sodium: 137 mmol/L (ref 135–145)
Total Bilirubin: 0.5 mg/dL (ref 0.3–1.2)
Total Protein: 5.5 g/dL — ABNORMAL LOW (ref 6.5–8.1)

## 2019-12-29 LAB — C-REACTIVE PROTEIN: CRP: 4.1 mg/dL — ABNORMAL HIGH (ref <1.0)

## 2019-12-29 LAB — FERRITIN: Ferritin: 288 ng/mL (ref 11–307)

## 2019-12-29 LAB — PHOSPHORUS: Phosphorus: 3.3 mg/dL (ref 2.5–4.6)

## 2019-12-29 LAB — FIBRIN DERIVATIVES D-DIMER (ARMC ONLY): Fibrin derivatives D-dimer (ARMC): 323.05 ng/mL (FEU) (ref 0.00–499.00)

## 2019-12-29 LAB — MAGNESIUM: Magnesium: 2.1 mg/dL (ref 1.7–2.4)

## 2019-12-29 NOTE — Discharge Summary (Signed)
Physician Discharge Summary  Kristen Mcpherson EFE:[id number] DOB: [birth year] DOA: 12/24/2019  PCP: Doren Custard, FNP  Admit date: 12/24/2019 Discharge date: 12/29/2019  Discharge disposition: Home   Recommendations for Outpatient Follow-Up:   Follow-up with PCP in 1 to 2 weeks   Discharge Diagnosis:   Principal Problem:   Pneumonia due to COVID-19 virus Active Problems:   Anxiety and depression   Acute respiratory failure due to COVID-19 [facility])    Discharge Condition: Stable.  Diet recommendation:  Diet Order            Diet general           Diet regular Room service appropriate? Yes; Fluid consistency: Thin  Diet effective now                   Code Status: Full Code     Hospital Course:   Ms. Kristen Mcpherson is a 39 y.o. female with medical history significant for anxiety, asthma, who presented to the hospital because of increasing shortness of breath, generalized weakness and fatigue..  She said she had been feeling sick for about 9 days prior to coming to the ED.  She had fever, myalgia, dry cough, anorexia, nausea, vomiting and diarrhea.  She tested positive for COVID-19 infection on 12/18/2019.  She had been taking Tylenol and Motrin without relief.  She was hypoxic in the emergency room with oxygen saturation of 87 to 89% on room air.  She was found to have COVID-19 pneumonia complicated by acute hypoxic respiratory failure.  She was treated with IV remdesivir infusion and IV steroids.  She also required up to 5 L/min oxygen via nasal cannula.  Her condition slowly improved and she was successfully weaned off of oxygen.  Oxygen saturation is 99% on room air.    She also had diarrhea that was likely due to COVID-19 gastroenteritis but this has resolved.  She was hypotensive and dehydrated requiring IV fluids.  Overall, blood pressure has improved although it still slightly low.  It is not clear if she has some hypotension at baseline.  She is completely  asymptomatic.  She is deemed stable for discharge to home today.     Discharge Exam:    Vitals:   12/29/19 0612 12/29/19 0736 12/29/19 0931 12/29/19 1000  BP: 94/60 (!) 95/54 (!) 92/59 (!) 92/55  Pulse: (!) 49 (!) 54 63 69  Resp: 16 20 16 16   Temp: 97.8 F (36.6 C) 97.8 F (36.6 C) 98.3 F (36.8 C) 98 F (36.7 C)  TempSrc: Oral Oral Oral Oral  SpO2: 98% 97% 99%   Weight:      Height:         GEN: NAD SKIN: Warm and dry EYES: No pallor or icterus ENT: MMM CV: RRR PULM: CTA B ABD: soft, obese, NT, +BS CNS: AAO x 3, non focal EXT: No edema or tenderness   The results of significant diagnostics from this hospitalization (including imaging, microbiology, ancillary and laboratory) are listed below for reference.     Procedures and Diagnostic Studies:   DG Chest 2 View  Result Date: 12/24/2019 CLINICAL DATA:  COVID-19 positive, body aches, dizziness, nausea, vomiting and upper chest pain EXAM: CHEST - 2 VIEW COMPARISON:  None. FINDINGS: Diffuse multifocal opacities throughout both lungs in a mid to lower lung and peripheral predominance. Some airways thickening and coarsened interstitial changes present as well. No pneumothorax. No effusion. The cardiomediastinal contours are unremarkable. No acute osseous or soft tissue  abnormality. IMPRESSION: Diffuse multifocal opacities throughout both lungs in a mid to lower lung and peripheral predominance, compatible with multifocal pneumonia in the setting of COVID-19 positivity. Electronically Signed   By: Kreg Shropshire M.D.   On: 12/24/2019 05:36     Labs:   Basic Metabolic Panel: Recent Labs  Lab 12/25/19 0505 12/26/19 0445 12/27/19 0401 12/28/19 0422 12/29/19 0512  NA 141 140 140 138 137  K 4.0 4.0 4.2 3.7 3.7  CL 108 108 107 106 107  CO2 25 24 25 26 25   GLUCOSE 152* 157* 141* 133* 127*  BUN 15 17 18 19 13   CREATININE 0.51 0.53 0.59 0.44 0.37*  CALCIUM 7.9* 8.1* 8.0* 8.2* 7.8*  MG 2.4 2.3 2.3 2.0 2.1  PHOS 3.5  3.6 3.6 3.2 3.3   GFR Estimated Creatinine Clearance: 105 mL/min (A) (by C-G formula based on SCr of 0.37 mg/dL (L)). Liver Function Tests: Recent Labs  Lab 12/25/19 0505 12/26/19 0445 12/27/19 0401 12/28/19 0422 12/29/19 0512  AST 56* 32 23 25 20   ALT 51* 37 32 37 39  ALKPHOS 30* 28* 26* 28* 28*  BILITOT 0.3 0.4 0.4 0.4 0.5  PROT 6.3* 5.8* 5.9* 5.8* 5.5*  ALBUMIN 2.9* 2.7* 2.9* 2.8* 2.6*   No results for input(s): LIPASE, AMYLASE in the last 168 hours. No results for input(s): AMMONIA in the last 168 hours. Coagulation profile No results for input(s): INR, PROTIME in the last 168 hours.  CBC: Recent Labs  Lab 12/25/19 0505 12/26/19 0445 12/27/19 0401 12/28/19 0422 12/29/19 0512  WBC 2.3* 5.5 6.7 10.4 6.9  NEUTROABS 1.3* 3.7 4.1 6.9 3.8  HGB 12.2 11.7* 11.8* 11.7* 11.2*  HCT 37.1 35.2* 35.6* 35.7* 33.3*  MCV 93.9 93.1 93.7 93.7 92.8  PLT 174 235 261 294 285   Cardiac Enzymes: No results for input(s): CKTOTAL, CKMB, CKMBINDEX, TROPONINI in the last 168 hours. BNP: Invalid input(s): POCBNP CBG: No results for input(s): GLUCAP in the last 168 hours. D-Dimer No results for input(s): DDIMER in the last 72 hours. Hgb A1c No results for input(s): HGBA1C in the last 72 hours. Lipid Profile No results for input(s): CHOL, HDL, LDLCALC, TRIG, CHOLHDL, LDLDIRECT in the last 72 hours. Thyroid function studies No results for input(s): TSH, T4TOTAL, T3FREE, THYROIDAB in the last 72 hours.  Invalid input(s): FREET3 Anemia work up Recent Labs    12/28/19 0422 12/29/19 0512  FERRITIN 339* 288   Microbiology No results found for this or any previous visit (from the past 240 hour(s)).   Discharge Instructions:   Discharge Instructions    Diet general   Complete by: As directed    Discharge instructions   Complete by: As directed    Isolate at home for 10 days.   ?   Person Under Monitoring Name: Kristen Mcpherson  Location: [address] [place]  [place]   Infection Prevention Recommendations for Individuals Confirmed to have, or Being Evaluated for, 2019 Novel Coronavirus (COVID-19) Infection Who Receive Care at Home  Individuals who are confirmed to have, or are being evaluated for, COVID-19 should follow the prevention steps below until a healthcare provider or local or state health department says they can return to normal activities.  Stay home except to get medical care You should restrict activities outside your home, except for getting medical care. Do not go to work, school, or public areas, and do not use public transportation or taxis.  Call ahead before visiting your doctor Before your medical  appointment, call the healthcare provider and tell them that you have, or are being evaluated for, COVID-19 infection. This will help the healthcare provider's office take steps to keep other people from getting infected. Ask your healthcare provider to call the local or state health department.  Monitor your symptoms Seek prompt medical attention if your illness is worsening (e.g., difficulty breathing). Before going to your medical appointment, call the healthcare provider and tell them that you have, or are being evaluated for, COVID-19 infection. Ask your healthcare provider to call the local or state health department.  Wear a facemask You should wear a facemask that covers your nose and mouth when you are in the same room with other people and when you visit a healthcare provider. People who live with or visit you should also wear a facemask while they are in the same room with you.  Separate yourself from other people in your home As much as possible, you should stay in a different room from other people in your home. Also, you should use a separate bathroom, if available.  Avoid sharing household items You should not share dishes, drinking glasses, cups, eating utensils, towels, bedding, or other items with other  people in your home. After using these items, you should wash them thoroughly with soap and water.  Cover your coughs and sneezes Cover your mouth and nose with a tissue when you cough or sneeze, or you can cough or sneeze into your sleeve. Throw used tissues in a lined trash can, and immediately wash your hands with soap and water for at least 20 seconds or use an alcohol-based hand rub.  Wash your [employer] your hands often and thoroughly with soap and water for at least 20 seconds. You can use an alcohol-based hand sanitizer if soap and water are not available and if your hands are not visibly dirty. Avoid touching your eyes, nose, and mouth with unwashed hands.   Prevention Steps for Caregivers and Household Members of Individuals Confirmed to have, or Being Evaluated for, COVID-19 Infection Being Cared for in the Home  If you live with, or provide care at home for, a person confirmed to have, or being evaluated for, COVID-19 infection please follow these guidelines to prevent infection:  Follow healthcare provider's instructions Make sure that you understand and can help the patient follow any healthcare provider instructions for all care.  Provide for the patient's basic needs You should help the patient with basic needs in the home and provide support for getting groceries, prescriptions, and other personal needs.  Monitor the patient's symptoms If they are getting sicker, call his or her medical provider and tell them that the patient has, or is being evaluated for, COVID-19 infection. This will help the healthcare provider's office take steps to keep other people from getting infected. Ask the healthcare provider to call the local or state health department.  Limit the number of people who have contact with the patient If possible, have only one caregiver for the patient. Other household members should stay in another home or place of residence. If this is not possible,  they should stay in another room, or be separated from the patient as much as possible. Use a separate bathroom, if available. Restrict visitors who do not have an essential need to be in the home.  Keep older adults, very young children, and other sick people away from the patient Keep older adults, very young children, and those who have compromised immune systems  or chronic health conditions away from the patient. This includes people with chronic heart, lung, or kidney conditions, diabetes, and cancer.  Ensure good ventilation Make sure that shared spaces in the home have good air flow, such as from an air conditioner or an opened window, weather permitting.  Wash your hands often Wash your hands often and thoroughly with soap and water for at least 20 seconds. You can use an alcohol based hand sanitizer if soap and water are not available and if your hands are not visibly dirty. Avoid touching your eyes, nose, and mouth with unwashed hands. Use disposable paper towels to dry your hands. If not available, use dedicated cloth towels and replace them when they become wet.  Wear a facemask and gloves Wear a disposable facemask at all times in the room and gloves when you touch or have contact with the patient's blood, body fluids, and/or secretions or excretions, such as sweat, saliva, sputum, nasal mucus, vomit, urine, or feces.  Ensure the mask fits over your nose and mouth tightly, and do not touch it during use. Throw out disposable facemasks and gloves after using them. Do not reuse. Wash your hands immediately after removing your facemask and gloves. If your personal clothing becomes contaminated, carefully remove clothing and launder. Wash your hands after handling contaminated clothing. Place all used disposable facemasks, gloves, and other waste in a lined container before disposing them with other household waste. Remove gloves and wash your hands immediately after handling these  items.  Do not share dishes, glasses, or other household items with the patient Avoid sharing household items. You should not share dishes, drinking glasses, cups, eating utensils, towels, bedding, or other items with a patient who is confirmed to have, or being evaluated for, COVID-19 infection. After the person uses these items, you should wash them thoroughly with soap and water.  Wash laundry thoroughly Immediately remove and wash clothes or bedding that have blood, body fluids, and/or secretions or excretions, such as sweat, saliva, sputum, nasal mucus, vomit, urine, or feces, on them. Wear gloves when handling laundry from the patient. Read and follow directions on labels of laundry or clothing items and detergent. In general, wash and dry with the warmest temperatures recommended on the label.  Clean all areas the individual has used often Clean all touchable surfaces, such as counters, tabletops, doorknobs, bathroom fixtures, toilets, phones, keyboards, tablets, and bedside tables, every day. Also, clean any surfaces that may have blood, body fluids, and/or secretions or excretions on them. Wear gloves when cleaning surfaces the patient has come in contact with. Use a diluted bleach solution (e.g., dilute bleach with 1 part bleach and 10 parts water) or a household disinfectant with a label that says EPA-registered for coronaviruses. To make a bleach solution at home, add 1 tablespoon of bleach to 1 quart (4 cups) of water. For a larger supply, add  cup of bleach to 1 gallon (16 cups) of water. Read labels of cleaning products and follow recommendations provided on product labels. Labels contain instructions for safe and effective use of the cleaning product including precautions you should take when applying the product, such as wearing gloves or eye protection and making sure you have good ventilation during use of the product. Remove gloves and wash hands immediately after  cleaning.  Monitor yourself for signs and symptoms of illness Caregivers and household members are considered close contacts, should monitor their health, and will be asked to limit movement outside of the home  to the extent possible. Follow the monitoring steps for close contacts listed on the symptom monitoring form.   ? If you have additional questions, contact your local health department or call the epidemiologist on call at [phone] (available 24/7). ? This guidance is subject to change. For the most up-to-date guidance from [facility], please refer to their website: [email or web address]   Increase activity slowly   Complete by: As directed      Allergies as of 12/29/2019   No Known Allergies     Medication List    STOP taking these medications   busPIRone 10 MG tablet Commonly known as: BUSPAR   Diethylpropion HCl CR 75 MG Tb24   docusate sodium 100 MG capsule Commonly known as: COLACE   ibuprofen 200 MG tablet Commonly known as: ADVIL   lubiprostone 8 MCG capsule Commonly known as: Amitiza   phentermine 15 MG capsule   topiramate 25 MG tablet Commonly known as: TOPAMAX   venlafaxine XR 37.5 MG 24 hr capsule Commonly known as: Effexor XR     TAKE these medications   levonorgestrel 20 MCG/24HR IUD Commonly known as: MIRENA 1 each by Intrauterine route once.         Time coordinating discharge: 29 minutes  Signed:  Dmya Long  [facility] 12/29/2019, 10:37 AM   Pager on [email or web address]. If 7PM-7AM, please contact night-coverage at [email or web address]

## 2019-12-30 ENCOUNTER — Telehealth: Payer: Self-pay

## 2019-12-30 NOTE — Telephone Encounter (Signed)
Transition Care Management Unsuccessful Follow-up Telephone Call  Date of discharge and from where:  12/29/2019 [facility]  Attempts:  1st Attempt  Reason for unsuccessful TCM follow-up call:  Left voice message

## 2019-12-31 NOTE — Telephone Encounter (Signed)
Transition Care Management Unsuccessful Follow-up Telephone Call  Date of discharge and from where:  12/29/2019 [facility]  Attempts:  2nd Attempt  Reason for unsuccessful TCM follow-up call:  Left voice message

## 2020-01-01 NOTE — Telephone Encounter (Signed)
Transition Care Management Unsuccessful Follow-up Telephone Call  Date of discharge and from where:  12/29/2019 [facility]  Attempts:  3rd Attempt  Reason for unsuccessful TCM follow-up call:  Left voice message

## 2020-01-11 ENCOUNTER — Other Ambulatory Visit: Payer: Self-pay

## 2020-01-11 ENCOUNTER — Telehealth (INDEPENDENT_AMBULATORY_CARE_PROVIDER_SITE_OTHER): Payer: Medicaid Other | Admitting: Family Medicine

## 2020-01-11 DIAGNOSIS — Z91199 Patient's noncompliance with other medical treatment and regimen due to unspecified reason: Secondary | ICD-10-CM

## 2020-01-11 DIAGNOSIS — Z5329 Procedure and treatment not carried out because of patient's decision for other reasons: Secondary | ICD-10-CM

## 2020-01-14 NOTE — Progress Notes (Signed)
No show

## 2020-01-26 DIAGNOSIS — H5213 Myopia, bilateral: Secondary | ICD-10-CM | POA: Diagnosis not present

## 2020-02-16 DIAGNOSIS — H52223 Regular astigmatism, bilateral: Secondary | ICD-10-CM | POA: Diagnosis not present

## 2020-02-16 DIAGNOSIS — H5212 Myopia, left eye: Secondary | ICD-10-CM | POA: Diagnosis not present

## 2020-03-07 ENCOUNTER — Encounter: Payer: Self-pay | Admitting: Obstetrics and Gynecology

## 2020-03-21 ENCOUNTER — Ambulatory Visit: Payer: Medicaid Other | Admitting: Obstetrics

## 2020-04-04 ENCOUNTER — Encounter: Payer: Self-pay | Admitting: Obstetrics & Gynecology

## 2020-04-04 ENCOUNTER — Ambulatory Visit (INDEPENDENT_AMBULATORY_CARE_PROVIDER_SITE_OTHER): Payer: Medicaid Other | Admitting: Obstetrics & Gynecology

## 2020-04-04 ENCOUNTER — Other Ambulatory Visit (HOSPITAL_COMMUNITY)
Admission: RE | Admit: 2020-04-04 | Discharge: 2020-04-04 | Disposition: A | Discharge status: Home / Self Care | Payer: Medicaid Other | Source: Ambulatory Visit | Attending: Obstetrics & Gynecology | Admitting: Obstetrics & Gynecology

## 2020-04-04 ENCOUNTER — Other Ambulatory Visit: Payer: Self-pay

## 2020-04-04 VITALS — BP 120/70 | Ht 66.0 in | Wt 206.0 lb

## 2020-04-04 DIAGNOSIS — E669 Obesity, unspecified: Secondary | ICD-10-CM | POA: Diagnosis not present

## 2020-04-04 DIAGNOSIS — Z9889 Other specified postprocedural states: Secondary | ICD-10-CM

## 2020-04-04 DIAGNOSIS — Z124 Encounter for screening for malignant neoplasm of cervix: Secondary | ICD-10-CM | POA: Insufficient documentation

## 2020-04-04 DIAGNOSIS — R102 Pelvic and perineal pain: Secondary | ICD-10-CM

## 2020-04-04 DIAGNOSIS — Z01419 Encounter for gynecological examination (general) (routine) without abnormal findings: Secondary | ICD-10-CM

## 2020-04-04 MED ORDER — DIETHYLPROPION HCL ER 75 MG PO TB24: 1.0000 | ORAL_TABLET | Freq: Every day | ORAL | 1 refills | Status: DC

## 2020-04-04 MED ORDER — TOPIRAMATE 25 MG PO TABS: 25.0000 mg | ORAL_TABLET | Freq: Two times a day (BID) | ORAL | 4 refills | Status: DC

## 2020-04-04 NOTE — Patient Instructions (Signed)
Thank you for choosing [facility]. As part of our ongoing efforts to improve patient experience, we would appreciate your feedback. Please fill out the short survey that you will receive by mail or MyChart. Your opinion is important to [place]! -Dr Tiburcio Pea  Diethylpropion tablets What is this medicine? DIETHYLPROPION (dye eth il PROE pee on) decreases your appetite. It is used with a reduced calorie diet and exercise to help you lose weight. This medicine is only meant to be used for a few weeks. This medicine may be used for other purposes; ask your health care provider or pharmacist if you have questions. COMMON BRAND NAME(S): Depletite # 2, Radtue, Tenuate What should I tell my health care provider before I take this medicine? They need to know if you have any of these conditions:  agitation  glaucoma  high blood pressure  history of drug dependence or substance abuse  hyperthyroid  kidney or liver disease  lung disease  valvular heart disease  an unusual or allergic reaction to diethylpropion, other medicines, foods, dyes, or preservatives  pregnant or trying to get pregnant  breast-feeding How should I use this medicine? Take this medicine by mouth with a glass of water. Follow the directions on the prescription label. Take this medicine 1 hour before meals. If a meal is missed, do not take that dose. An additional tablet may be taken in the mid evening if needed for night time hunger. Do not take your medicine more often than directed. Talk to your pediatrician regarding the use of this medicine in children. Special care may be needed. While this medicine may be prescribed for children as young as 16 years for selected conditions, precautions do apply. Overdosage: If you think you have taken too much of this medicine contact a poison control center or emergency room at once. NOTE: This medicine is only for you. Do not share this medicine with others. What if I miss a  dose? If you miss a dose, take a dose as soon as you can with the next meal. If it is almost time for your next dose, take only that dose. Do not take double or extra doses. What may interact with this medicine? Do not take this medicine with any of the following medications:  fluoxetine  MAOIs like Carbex, Eldepryl, Marplan, Nardil, and Parnate  medicines for colds or breathing difficulties like pseudoephedrine or phenylephrine  other medicines or herbal products for weight loss or to decrease appetite  procarbazine  sibutramine  stimulants like amphetamine, dextroamphetamine, dexmethylphenidate, methylphenidate or modafinil This medicine may also interact with the following medications:  general anesthetics  insulin and other medicines for diabetes  medicines for high blood pressure  phenothiazines like chlorpromazine, mesoridazine, prochlorperazine, thioridazine This list may not describe all possible interactions. Give your health care provider a list of all the medicines, herbs, non-prescription drugs, or dietary supplements you use. Also tell them if you smoke, drink alcohol, or use illegal drugs. Some items may interact with your medicine. What should I watch for while using this medicine? Visit your doctor or health care professional for regular checks on your progress. You need to closely monitor your weight loss. If your rate of weight loss slows down or stops, you may need to stop the medicine, and restart after a time without the medicine. You may get dizzy. Do not drive, use machinery, or do anything that needs mental alertness until you know how this medicine affects you. Do not stand or sit up  quickly, especially if you are an older patient. This reduces the risk of dizzy or fainting spells. Alcohol may increase dizziness. Avoid alcoholic drinks. What side effects may I notice from receiving this medicine? Side effects that you should report to your doctor or health  care professional as soon as possible:  allergic reactions like skin rash, itching or hives, swelling of the face, lips, or tongue  angry, confusion, more nervous, restless  breast growth in men  breathing problems  changes in vision  chest pain  difficulty with balance  fast, irregular heartbeat  hallucinations  nausea, vomiting  seizures  tremors  trouble sleeping Side effects that usually do not require medical attention (report to your doctor or health care professional if they continue or are bothersome):  constipation or diarrhea  dry mouth or unpleasant taste  flushing of the skin  hair loss  headache  increase or decrease in sexual desire or performance  menstrual irregularity  red or itchy skin  upset stomach This list may not describe all possible side effects. Call your doctor for medical advice about side effects. You may report side effects to FDA at 1-800-FDA-1088. Where should I keep my medicine? Keep out of the reach of children. This medicine can be abused. Keep your medicine in a safe place to protect it from theft. Do not share this medicine with anyone. Selling or giving away this medicine is dangerous and against the law. This medicine may cause accidental overdose and death if taken by other adults, children, or pets. Mix any unused medicine with a substance like cat litter or coffee grounds. Then throw the medicine away in a sealed container like a sealed bag or a coffee can with a lid. Do not use the medicine after the expiration date. Store at room temperature below 30 degrees C (86 degrees F). Protect from heat. Keep container tightly closed. NOTE: This sheet is a summary. It may not cover all possible information. If you have questions about this medicine, talk to your doctor, pharmacist, or health care provider.  2021 Elsevier/Gold Standard (2013-09-08 15:18:21)

## 2020-04-04 NOTE — Progress Notes (Signed)
HPI:      Ms. Kristen Mcpherson is a 40 y.o. [id number] who LMP was No LMP recorded. (Menstrual status: IUD)., she presents today for her annual examination. The patient has no complaints today other than weight gain since Covid in Dec; also has had 2 weeks of irreg bleeding and bloating type lower abd pains.  Has IUD for period control. The patient is sexually active. Her last pap: approximate date 2021 and was normal and prior LEEP for dysplasia (CIN III). The patient does perform self breast exams.  There is no notable family history of breast or ovarian cancer in her family.  The patient has regular exercise: yes.  The patient denies current symptoms of depression.    GYN History: Contraception: tubal ligation  PMHx: Past Medical History:  Diagnosis Date  . AMA (advanced maternal age) multigravida 37+, second trimester 08/29/2017  . Anemia   . Anxiety   . Asthma   . Breech presentation delivered 03/20/2018  . Cesarean delivery indicated due to breech presentation 04/03/2018  . 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 Antibody:  Anatomic [place]  complete Rubella: Immune (07/19 0000)  Varicella: Immune GTT Early: WNLThird trimester:  RPR: Nonreactive (07/19 0000)  Rhogam  not needed HBsAg: Negative (07/19 0000)  TDaP vaccine                    . Iron deficiency anemia 01/08/2018   Past Surgical History:  Procedure Laterality Date  . APPENDECTOMY    . CERVICAL CONIZATION W/BX N/A 06/12/2018   Procedure: CONIZATION CERVIX WITH BIOPSY - COLD KNIFE;  Surgeon: Nadara Mustard, MD;  Location: [facility] ORS;  Service: Gynecology;  Laterality: N/A;  . CESAREAN SECTION N/A 03/20/2018   Procedure: CESAREAN SECTION;  Surgeon: Nadara Mustard, MD;  Location: [facility] ORS;  Service: Obstetrics;  Laterality: N/A;  . TUBAL LIGATION  Bilateral 03/20/2018   Procedure: BILATERAL TUBAL LIGATION;  Surgeon: Nadara Mustard, MD;  Location: [facility] ORS;  Service: Obstetrics;  Laterality: Bilateral;   Family History  Problem Relation Age of Onset  . Diabetes Mother   . Arthritis Mother   . Depression Mother   . Anxiety disorder Mother   . Anemia Mother   . Stroke Mother   . Restless legs syndrome Mother   . Hyperlipidemia Father   . Depression Father   . Hypertension Father   . ADD / ADHD Brother   . Diabetes Maternal Grandmother   . Liver disease Maternal Grandmother   . Rheum arthritis Maternal Grandfather   . Dementia Maternal Grandfather    Social History   Tobacco Use  . Smoking status: Current Every Day Smoker    Packs/day: 0.50    Types: Cigarettes    Start date: 01/02/2000  . Smokeless tobacco: Never Used  Vaping Use  . Vaping Use: Never used  Substance Use Topics  . Alcohol use: Yes    Comment: once in a blue moon  . Drug use: Never    Current Outpatient Medications:  Marland Kitchen  Diethylpropion HCl CR 75 MG TB24, Take 1 tablet (75 mg total) by mouth daily before breakfast., Disp: 30 tablet, Rfl: 1 .  levonorgestrel (MIRENA) 20 MCG/24HR IUD, 1 each by Intrauterine route once., Disp: , Rfl:  .  topiramate (TOPAMAX) 25 MG tablet, Take 1  tablet (25 mg total) by mouth 2 (two) times daily., Disp: 60 tablet, Rfl: 4 Allergies: Patient has no known allergies.  Review of Systems  Constitutional: Negative for chills, fever and malaise/fatigue.  HENT: Negative for congestion, sinus pain and sore throat.   Eyes: Negative for blurred vision and pain.  Respiratory: Negative for cough and wheezing.   Cardiovascular: Negative for chest pain and leg swelling.  Gastrointestinal: Positive for abdominal pain. Negative for constipation, diarrhea, heartburn, nausea and vomiting.  Genitourinary: Negative for dysuria, frequency, hematuria and urgency.  Musculoskeletal: Negative for back pain, joint pain, myalgias and neck pain.   Skin: Negative for itching and rash.  Neurological: Negative for dizziness, tremors and weakness.  Endo/Heme/Allergies: Does not bruise/bleed easily.  Psychiatric/Behavioral: Negative for depression. The patient is nervous/anxious. The patient does not have insomnia.     Objective: BP 120/70   Ht 5\' 6"  (1.676 m)   Wt 206 lb (93.4 kg)   BMI 33.25 kg/m   Filed Weights   04/04/20 1340  Weight: 206 lb (93.4 kg)   Body mass index is 33.25 kg/m. Physical Exam Constitutional:      General: She is not in acute distress.    Appearance: She is well-developed.  Genitourinary:     Rectum normal.     No lesions in the vagina.     No vaginal bleeding.      Right Adnexa: not tender and no mass present.    Left Adnexa: not tender and no mass present.    No cervical motion tenderness, friability, lesion or polyp.     IUD strings visualized.     Uterus is not enlarged.     No uterine mass detected.    Uterus is midaxial.     Pelvic exam was performed with patient in the lithotomy position.  Breasts:     Right: No mass, skin change or tenderness.     Left: No mass, skin change or tenderness.    HENT:     Head: Normocephalic and atraumatic. No laceration.     Right Ear: Hearing normal.     Left Ear: Hearing normal.     Mouth/Throat:     Pharynx: Uvula midline.  Eyes:     Pupils: Pupils are equal, round, and reactive to light.  Neck:     Thyroid: No thyromegaly.  Cardiovascular:     Rate and Rhythm: Normal rate and regular rhythm.     Heart sounds: No murmur heard. No friction rub. No gallop.   Pulmonary:     Effort: Pulmonary effort is normal. No respiratory distress.     Breath sounds: Normal breath sounds. No wheezing.  Abdominal:     General: Bowel sounds are normal. There is no distension.     Palpations: Abdomen is soft.     Tenderness: There is no abdominal tenderness. There is no rebound.  Musculoskeletal:        General: Normal range of motion.     Cervical back:  Normal range of motion and neck supple.  Neurological:     Mental Status: She is alert and oriented to person, place, and time.     Cranial Nerves: No cranial nerve deficit.  Skin:    General: Skin is warm and dry.  Psychiatric:        Judgment: Judgment normal.  Vitals reviewed.     Assessment:  ANNUAL EXAM 1. Women's annual routine gynecological examination   2. S/P LEEP   3. Screening for  cervical cancer   4. Obesity (BMI 30-39.9)   5. Pelvic pain      Screening Plan:            1.  Cervical Screening-  Pap smear done today  2. Breast screening- Exam annually and mammogram>40 planned   3. Labs managed by PCP   4. Obesity (BMI 30-39.9) Restart meds for trial; pros and cons discussed - Diethylpropion HCl CR 75 MG TB24; Take 1 tablet (75 mg total) by mouth daily before breakfast.  Dispense: 30 tablet; Refill: 1 - topiramate (TOPAMAX) 25 MG tablet; Take 1 tablet (25 mg total) by mouth 2 (two) times daily.  Dispense: 60 tablet; Refill: 4   5. Pelvic pain [place] to assess pain and bleeding and IUD position - US PELVIC COMPLETE WITH TRANSVAGINAL; Future      F/U  Return in about 2 months (around 06/04/2020) for Follow up; also have GYN US done soon (order placed).  Annamarie Major, MD, [facility], [facility] 04/04/2020  2:04 PM

## 2020-04-07 ENCOUNTER — Encounter: Payer: Self-pay | Admitting: Obstetrics and Gynecology

## 2020-04-07 LAB — CYTOLOGY - PAP
Adequacy: ABSENT
Diagnosis: UNDETERMINED — AB

## 2020-05-24 ENCOUNTER — Ambulatory Visit
Admission: RE | Admit: 2020-05-24 | Discharge: 2020-05-24 | Disposition: A | Discharge status: Home / Self Care | Payer: Medicaid Other | Source: Ambulatory Visit | Attending: Obstetrics & Gynecology | Admitting: Obstetrics & Gynecology

## 2020-05-24 ENCOUNTER — Other Ambulatory Visit: Payer: Self-pay

## 2020-05-24 DIAGNOSIS — R102 Pelvic and perineal pain: Secondary | ICD-10-CM | POA: Diagnosis not present

## 2020-05-27 ENCOUNTER — Ambulatory Visit (INDEPENDENT_AMBULATORY_CARE_PROVIDER_SITE_OTHER): Payer: Medicaid Other | Admitting: Obstetrics & Gynecology

## 2020-05-27 ENCOUNTER — Other Ambulatory Visit: Payer: Self-pay

## 2020-05-27 ENCOUNTER — Encounter: Payer: Self-pay | Admitting: Obstetrics & Gynecology

## 2020-05-27 VITALS — BP 100/60 | Ht 66.0 in | Wt 195.0 lb

## 2020-05-27 DIAGNOSIS — N921 Excessive and frequent menstruation with irregular cycle: Secondary | ICD-10-CM

## 2020-05-27 DIAGNOSIS — E669 Obesity, unspecified: Secondary | ICD-10-CM

## 2020-05-27 DIAGNOSIS — N9419 Other specified dyspareunia: Secondary | ICD-10-CM | POA: Diagnosis not present

## 2020-05-27 DIAGNOSIS — R102 Pelvic and perineal pain: Secondary | ICD-10-CM

## 2020-05-27 MED ORDER — DIETHYLPROPION HCL ER 75 MG PO TB24
1.0000 | ORAL_TABLET | Freq: Every day | ORAL | 1 refills | Status: DC
Start: 2020-05-27 — End: 2020-09-20

## 2020-05-27 MED ORDER — DIETHYLPROPION HCL ER 75 MG PO TB24
1.0000 | ORAL_TABLET | Freq: Every day | ORAL | 1 refills | Status: DC
Start: 2020-05-27 — End: 2020-05-27

## 2020-05-27 NOTE — Progress Notes (Signed)
HPI: Pt is having pain and bleeding and also pain w intercourse and feels it is related to the IUD.  She has severe menorrhagia and dysmenorrhea when not on hormones, has her whole adult life.  NSVD 2 years ago, IUD then placed, irreg and heavy periods ever since and now more so pain associated w periods and bleeding.  Pain w sex has led to less interest and desire.  Desires difinitive management, has had tubal and has no desire for future fertility.  Ultrasound demonstrates no masses seen, IUD correct orientation  Weight los 11 lbs last 2 mos on meds, doing well.  No SE.  PMHx: She  has a past medical history of AMA (advanced maternal age) multigravida 35+, second trimester (08/29/2017), Anemia, Anxiety, Asthma, Breech presentation delivered (03/20/2018), Cesarean delivery indicated due to breech presentation (04/03/2018), Family history of breast cancer, Family history of ovarian cancer, High-risk pregnancy, third trimester (08/29/2017), and Iron deficiency anemia (01/08/2018). Also,  has a past surgical history that includes Appendectomy; Cesarean section (N/A, 03/20/2018); Tubal ligation (Bilateral, 03/20/2018); and Cervical conization w/bx (N/A, 06/12/2018)., family history includes ADD / ADHD in her brother; Anemia in her mother; Anxiety disorder in her mother; Arthritis in her mother; Breast cancer in her maternal grandmother and paternal grandmother; Dementia in her maternal grandfather; Depression in her father and mother; Diabetes in her maternal grandmother and mother; Hyperlipidemia in her father; Hypertension in her father; Liver disease in her maternal grandmother; Ovarian cancer in her paternal aunt; Restless legs syndrome in her mother; Rheum arthritis in her maternal grandfather; Stroke in her mother.,  reports that she has been smoking cigarettes. She started smoking about 20 years ago. She has been smoking about 0.50 packs per day. She has never used smokeless tobacco. She reports current alcohol  use. She reports that she does not use drugs.  She has a current medication list which includes the following prescription(s): levonorgestrel, topiramate, and diethylpropion hcl cr. Also, has No Known Allergies.  Review of Systems  Constitutional: Negative for chills, fever and malaise/fatigue.  HENT: Negative for congestion, sinus pain and sore throat.   Eyes: Negative for blurred vision and pain.  Respiratory: Negative for cough and wheezing.   Cardiovascular: Negative for chest pain and leg swelling.  Gastrointestinal: Negative for abdominal pain, constipation, diarrhea, heartburn, nausea and vomiting.  Genitourinary: Negative for dysuria, frequency, hematuria and urgency.  Musculoskeletal: Negative for back pain, joint pain, myalgias and neck pain.  Skin: Negative for itching and rash.  Neurological: Negative for dizziness, tremors and weakness.  Endo/Heme/Allergies: Does not bruise/bleed easily.  Psychiatric/Behavioral: Negative for depression. The patient is not nervous/anxious and does not have insomnia.     Objective: BP 100/60   Ht 5\' 6"  (1.676 m)   Wt 195 lb (88.5 kg)   BMI 31.47 kg/m   Physical examination Constitutional NAD, Conversant  Skin No rashes, lesions or ulceration.   Extremities: Moves all appropriately.  Normal ROM for age. No lymphadenopathy.  Neuro: Grossly intact  Psych: Oriented to PPT.  Normal mood. Normal affect.   Assessment:  Pelvic Pain  Obesity (BMI 30-39.9) - Plan: Diethylpropion HCl CR 75 MG TB24, will cont meds for another month until surgery then stop  Dyspareunia due to medical condition in female  Menometrorrhagia  Will plan to address pain and bleeding w hysterectomy.  All options counseled including changing to other hormone period control or holiday from IUD/hormones. Pt prefers definitive surgical management, with preservation of ovaries for normal hormones but no  bleeding. Info given to review.  A total of 30 minutes were spent  face-to-face with the patient as well as preparation, review, communication, and documentation during this encounter.   Annamarie Major, MD, [facility], [facility] 05/27/2020  11:13 AM

## 2020-05-27 NOTE — Patient Instructions (Signed)
Total Laparoscopic Hysterectomy A total laparoscopic hysterectomy is a minimally invasive surgery to remove the uterus and cervix. The fallopian tubes and ovaries can also be removed during this surgery, if necessary. This procedure may be done to treat problems such as:  Growths in the uterus (uterine fibroids) that are not cancer but cause symptoms.  A condition that causes the lining of the uterus to grow in other areas (endometriosis).  Problems with pelvic support.  Cancer of the cervix, ovaries, uterus, or tissue that lines the uterus (endometrium).  Excessive bleeding in the uterus. After this procedure, you will no longer be able to have a baby, and you will no longer have a menstrual period. Tell a health care provider about:  Any allergies you have.  All medicines you are taking, including vitamins, herbs, eye drops, creams, and over-the-counter medicines.  Any problems you or family members have had with anesthetic medicines.  Any blood disorders you have.  Any surgeries you have had.  Any medical conditions you have.  Whether you are pregnant or may be pregnant. What are the risks? Generally, this is a safe procedure. However, problems may occur, including:  Infection.  Bleeding.  Blood clots in the legs or lungs.  Allergic reactions to medicines.  Damage to nearby structures or organs.  Having to change from this surgery to one in which a large incision is made in the abdomen (abdominal hysterectomy). What happens before the procedure? Staying hydrated Follow instructions from your health care provider about hydration, which may include:  Up to 2 hours before the procedure - you may continue to drink clear liquids, such as water, clear fruit juice, black coffee, and plain tea.   Eating and drinking restrictions Follow instructions from your health care provider about eating and drinking, which may include:  8 hours before the procedure - stop eating  heavy meals or foods, such as meat, fried foods, or fatty foods.  6 hours before the procedure - stop eating light meals or foods, such as toast or cereal.  6 hours before the procedure - stop drinking milk or drinks that contain milk.  2 hours before the procedure - stop drinking clear liquids. Medicines  Ask your health care provider about: ? Changing or stopping your regular medicines. This is especially important if you are taking diabetes medicines or blood thinners. ? Taking medicines such as aspirin and ibuprofen. These medicines can thin your blood. Do not take these medicines unless your health care provider tells you to take them. ? Taking over-the-counter medicines, vitamins, herbs, and supplements.  You may be asked to take medicine that helps you have a bowel movement (laxative) to prevent constipation. General instructions  If you were asked to do bowel preparation before the procedure, follow instructions from your health care provider.  This procedure can affect the way you feel about yourself. Talk with your health care provider about the physical and emotional changes hysterectomy may cause.  Do not use any products that contain nicotine or tobacco for at least 4 weeks before the procedure. These products include cigarettes, chewing tobacco, and vaping devices, such as e-cigarettes. If you need help quitting, ask your health care provider.  Plan to have a responsible adult take you home from the hospital or clinic.  Plan to have a responsible adult care for you for the time you are told after you leave the hospital or clinic. This is important. Surgery safety Ask your health care provider:  How your surgery   site will be marked.  What steps will be taken to help prevent infection. These may include: ? Removing hair at the surgery site. ? Washing skin with a germ-killing soap. ? Receiving antibiotic medicine. What happens during the procedure?  An IV will be  inserted into one of your veins.  You will be given one or more of the following: ? A medicine to help you relax (sedative). ? A medicine to make you fall asleep (general anesthetic). ? A medicine to numb the area (local anesthetic). ? A medicine that is injected into your spine to numb the area below and slightly above the injection site (spinal anesthetic). ? A medicine that is injected into an area of your body to numb everything below the injection site (regional anesthetic).  A gas will be used to inflate your abdomen. This will allow your surgeon to look inside your abdomen and do the surgery.  Three or four small incisions will be made in your abdomen.  A small device with a light (laparoscope) will be inserted into one of your incisions. Surgical instruments will be inserted through the other incisions in order to perform the procedure.  Your uterus and cervix may be removed through your vagina or cut into small pieces and removed through the small incisions. Any other organs that need to be removed will also be removed this way.  The gas will be released from inside your abdomen.  Your incisions will be closed with stitches (sutures), skin glue, or adhesive strips.  A bandage (dressing) may be placed over your incisions. The procedure may vary among health care providers and hospitals. What happens after the procedure?  Your blood pressure, heart rate, breathing rate, and blood oxygen level will be monitored until you leave the hospital or clinic.  You will be given medicine for pain as needed.  You will be encouraged to walk as soon as possible. You will also use a device to help you breathe or do breathing exercises to keep your lungs clear.  You may have to wear compression stockings. These stockings help to prevent blood clots and reduce swelling in your legs.  You will need to wear a sanitary pad for vaginal discharge or bleeding. Summary  Total laparoscopic  hysterectomy is a procedure to remove your uterus, cervix, and sometimes the fallopian tubes and ovaries.  This procedure can affect the way you feel about yourself. Talk with your health care provider about the physical and emotional changes hysterectomy may cause.  After this procedure, you will no longer be able to have a baby, and you will no longer have a menstrual period.  You will be given pain medicine to control discomfort after this procedure.  Plan to have a responsible adult take you home from the hospital or clinic. This information is not intended to replace advice given to you by your health care provider. Make sure you discuss any questions you have with your health care provider. Document Revised: 08/21/2019 Document Reviewed: 08/21/2019 Elsevier Patient Education  2021 [employer]vier [employer]  

## 2020-06-02 ENCOUNTER — Encounter: Payer: Self-pay | Admitting: Oncology

## 2020-06-03 ENCOUNTER — Telehealth: Payer: Self-pay

## 2020-06-03 NOTE — Telephone Encounter (Signed)
Left a message for the patient to return the call.  

## 2020-06-03 NOTE — Telephone Encounter (Signed)
-----   Message from Nadara Mustard, MD sent at 05/27/2020 11:10 AM EDT ----- Regarding: Surgery Surgery Booking Request Patient Full Name:  Kristen Mcpherson  MRN: [id number]  DOB: 16-Aug-1980  Surgeon: Letitia Libra, MD  Requested Surgery Date and Time: Jun 16 or after Primary Diagnosis AND Code: Pain R10.2, Dyspareunia N94.19, Menometrorrhagia N92.1 Secondary Diagnosis and Code:  Surgical Procedure: TLH/BS RNFA Requested?: No L&D Notification: No Admission Status: same day surgery Length of Surgery: 50 min Special Case Needs: No H&P: Yes Phone Interview???:  Yes Interpreter: No Medical Clearance:  No Special Scheduling Instructions: No Any known health/anesthesia issues, diabetes, sleep apnea, latex allergy, defibrillator/pacemaker?: No Acuity: P3   (P1 highest, P2 delay may cause harm, P3 low, elective gyn, P4 lowest)

## 2020-06-03 NOTE — Telephone Encounter (Signed)
Called patient to schedule TLH/BS w Phylliss Blakes 07/19/20  H&P 7/8 @ 11:20   Pre-admit phone call appointment to be requested - date and time will be included on H&P paper work. Also all appointments will be updated on pt MyChart. Explained that this appointment has a call window. Based on the time scheduled will indicate if the call will be received within a 4 hour window before 1:00 or after.  Advised that pt may also receive calls from the hospital pharmacy and pre-service center.  Confirmed pt has [employer] as primary insurance. No secondary insurance.

## 2020-06-06 ENCOUNTER — Ambulatory Visit: Payer: Medicaid Other | Admitting: Obstetrics & Gynecology

## 2020-06-06 ENCOUNTER — Other Ambulatory Visit: Payer: Self-pay | Admitting: Obstetrics & Gynecology

## 2020-07-05 ENCOUNTER — Encounter: Payer: Self-pay | Admitting: Oncology

## 2020-07-08 ENCOUNTER — Other Ambulatory Visit: Payer: Self-pay

## 2020-07-08 ENCOUNTER — Encounter: Payer: Self-pay | Admitting: Obstetrics & Gynecology

## 2020-07-08 ENCOUNTER — Ambulatory Visit (INDEPENDENT_AMBULATORY_CARE_PROVIDER_SITE_OTHER): Payer: Medicaid Other | Admitting: Obstetrics & Gynecology

## 2020-07-08 ENCOUNTER — Encounter: Payer: Medicaid Other | Admitting: Obstetrics & Gynecology

## 2020-07-08 VITALS — BP 100/70 | Ht 66.0 in | Wt 189.0 lb

## 2020-07-08 DIAGNOSIS — Z9889 Other specified postprocedural states: Secondary | ICD-10-CM

## 2020-07-08 DIAGNOSIS — R102 Pelvic and perineal pain: Secondary | ICD-10-CM

## 2020-07-08 DIAGNOSIS — N921 Excessive and frequent menstruation with irregular cycle: Secondary | ICD-10-CM

## 2020-07-08 NOTE — Progress Notes (Signed)
PRE-OPERATIVE HISTORY AND PHYSICAL EXAM  HPI:  Kristen Mcpherson is a 40 y.o. [id number] No LMP recorded. (Menstrual status: IUD).; she is being admitted for surgery related to abnormal uterine bleeding and pelvic pain.  Also has prior h/o LEEP for CIN 3.  Desires definitive surgical management as IUD and other measures have not helped.  She no longer desires future fertility, and has had tubal done to prevent pregnancy.  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 Antibody:  Anatomic [place]  complete Rubella: Immune (07/19 0000)  Varicella: Immune GTT Early: WNLThird trimester:  RPR: Nonreactive (07/19 0000)  Rhogam  not needed HBsAg: Negative (07/19 0000)  TDaP vaccine                     Iron deficiency anemia 01/08/2018   Past Surgical History:  Procedure Laterality Date   APPENDECTOMY     CERVICAL CONIZATION W/BX N/A 06/12/2018   Procedure: CONIZATION CERVIX WITH BIOPSY - COLD KNIFE;  Surgeon: Nadara Mustard, MD;  Location: [facility];  Service: Gynecology;  Laterality: N/A;   CESAREAN SECTION N/A 03/20/2018   Procedure: CESAREAN SECTION;  Surgeon: Nadara Mustard, MD;  Location: [facility];  Service: Obstetrics;  Laterality: N/A;   TUBAL LIGATION Bilateral 03/20/2018   Procedure: BILATERAL TUBAL LIGATION;  Surgeon: Nadara Mustard, MD;  Location: [facility];  Service: Obstetrics;  Laterality: Bilateral;   Family History  Problem Relation  Age of Onset   Diabetes Mother    Arthritis Mother    Depression Mother    Anxiety disorder Mother    Anemia Mother    Stroke Mother    Restless legs syndrome Mother    Hyperlipidemia Father    Depression Father    Hypertension Father    ADD / ADHD Brother    Diabetes Maternal Grandmother    Liver disease Maternal Grandmother    Breast cancer Maternal Grandmother    Rheum arthritis Maternal Grandfather    Dementia Maternal Grandfather    Breast cancer Paternal Grandmother    Ovarian cancer Paternal Aunt    Social History   Tobacco Use   Smoking status: Every Day    Packs/day: 0.50    Pack years: 0.00    Types: Cigarettes    Start date: 01/02/2000   Smokeless tobacco: Never  Vaping Use   Vaping Use: Never used  Substance Use Topics   Alcohol use: Yes    Comment: once in a blue moon   Drug use: Never    Current Outpatient Medications:    Diethylpropion HCl CR 75 MG TB24, Take 1 tablet (75 mg total) by mouth daily before breakfast., Disp: 30 tablet, Rfl: 1   levonorgestrel (MIRENA) 20 MCG/24HR IUD, 1 each by Intrauterine route once. (Patient not taking: Reported on 07/08/2020), Disp: , Rfl:    topiramate (TOPAMAX) 25 MG tablet, Take 1 tablet (25  mg total) by mouth 2 (two) times daily. (Patient not taking: Reported on 07/08/2020), Disp: 60 tablet, Rfl: 4 Allergies: Patient has no known allergies.  Review of Systems  Constitutional:  Negative for chills, fever and malaise/fatigue.  HENT:  Negative for congestion, sinus pain and sore throat.   Eyes:  Negative for blurred vision and pain.  Respiratory:  Negative for cough and wheezing.   Cardiovascular:  Negative for chest pain and leg swelling.  Gastrointestinal:  Negative for abdominal pain, constipation, diarrhea, heartburn, nausea and vomiting.  Genitourinary:  Negative for dysuria, frequency, hematuria and urgency.  Musculoskeletal:  Negative for back pain, joint pain, myalgias and neck pain.  Skin:  Negative for itching  and rash.  Neurological:  Negative for dizziness, tremors and weakness.  Endo/Heme/Allergies:  Does not bruise/bleed easily.  Psychiatric/Behavioral:  Negative for depression. The patient is not nervous/anxious and does not have insomnia.   All other systems reviewed and are negative.  Objective: BP 100/70   Ht 5\' 6"  (1.676 m)   Wt 189 lb (85.7 kg)   BMI 30.51 kg/m   Filed Weights   07/08/20 1327  Weight: 189 lb (85.7 kg)   Physical Exam Constitutional:      General: She is not in acute distress.    Appearance: She is well-developed.  HENT:     Head: Normocephalic and atraumatic. No laceration.     Right Ear: Hearing normal.     Left Ear: Hearing normal.     Mouth/Throat:     Pharynx: Uvula midline.  Eyes:     Pupils: Pupils are equal, round, and reactive to light.  Neck:     Thyroid: No thyromegaly.  Cardiovascular:     Rate and Rhythm: Normal rate and regular rhythm.     Heart sounds: No murmur heard.   No friction rub. No gallop.  Pulmonary:     Effort: Pulmonary effort is normal. No respiratory distress.     Breath sounds: Normal breath sounds. No wheezing.  Abdominal:     General: Bowel sounds are normal. There is no distension.     Palpations: Abdomen is soft.     Tenderness: There is no abdominal tenderness. There is no rebound.  Musculoskeletal:        General: Normal range of motion.     Cervical back: Normal range of motion and neck supple.  Neurological:     Mental Status: She is alert and oriented to person, place, and time.     Cranial Nerves: No cranial nerve deficit.  Skin:    General: Skin is warm and dry.  Psychiatric:        Judgment: Judgment normal.  Vitals reviewed.    Assessment: 1. Pelvic pain   2. Menometrorrhagia   3. S/P LEEP   Plan TLH, BS, pros and cons of this approach d/w pt.  Preservation of ovaries planned and this rationale is also discussed today.  I have had a careful discussion with this patient about all the options  available and the risk/benefits of each. I have fully informed this patient that surgery may subject her to a variety of discomforts and risks: She understands that most patients have surgery with little difficulty, but problems can happen ranging from minor to fatal. These include nausea, vomiting, pain, bleeding, infection, poor healing, hernia, or formation of adhesions. Unexpected reactions may occur from any drug or anesthetic given. Unintended injury may occur to other pelvic or abdominal structures such as Fallopian tubes, ovaries,  bladder, ureter (tube from kidney to bladder), or bowel. Nerves going from the pelvis to the legs may be injured. Any such injury may require immediate or later additional surgery to correct the problem. Excessive blood loss requiring transfusion is very unlikely but possible. Dangerous blood clots may form in the legs or lungs. Physical and sexual activity will be restricted in varying degrees for an indeterminate period of time but most often 2-6 weeks.  Finally, she understands that it is impossible to list every possible undesirable effect and that the condition for which surgery is done is not always cured or significantly improved, and in rare cases may be even worse.Ample time was given to answer all questions.  Annamarie Major, MD, [facility], [facility] 07/08/2020  1:45 PM

## 2020-07-08 NOTE — H&P (View-Only) (Signed)
PRE-OPERATIVE HISTORY AND PHYSICAL EXAM  HPI:  Kristen Mcpherson is a 40 y.o. [id number] No LMP recorded. (Menstrual status: IUD).; she is being admitted for surgery related to abnormal uterine bleeding and pelvic pain.  Also has prior h/o LEEP for CIN 3.  Desires definitive surgical management as IUD and other measures have not helped.  She no longer desires future fertility, and has had tubal done to prevent pregnancy.  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 Antibody:  Anatomic [place]  complete Rubella: Immune (07/19 0000)  Varicella: Immune GTT Early: WNLThird trimester:  RPR: Nonreactive (07/19 0000)  Rhogam  not needed HBsAg: Negative (07/19 0000)  TDaP vaccine                     Iron deficiency anemia 01/08/2018   Past Surgical History:  Procedure Laterality Date   APPENDECTOMY     CERVICAL CONIZATION W/BX N/A 06/12/2018   Procedure: CONIZATION CERVIX WITH BIOPSY - COLD KNIFE;  Surgeon: Nadara Mustard, MD;  Location: [facility];  Service: Gynecology;  Laterality: N/A;   CESAREAN SECTION N/A 03/20/2018   Procedure: CESAREAN SECTION;  Surgeon: Nadara Mustard, MD;  Location: [facility];  Service: Obstetrics;  Laterality: N/A;   TUBAL LIGATION Bilateral 03/20/2018   Procedure: BILATERAL TUBAL LIGATION;  Surgeon: Nadara Mustard, MD;  Location: [facility];  Service: Obstetrics;  Laterality: Bilateral;   Family History  Problem Relation  Age of Onset   Diabetes Mother    Arthritis Mother    Depression Mother    Anxiety disorder Mother    Anemia Mother    Stroke Mother    Restless legs syndrome Mother    Hyperlipidemia Father    Depression Father    Hypertension Father    ADD / ADHD Brother    Diabetes Maternal Grandmother    Liver disease Maternal Grandmother    Breast cancer Maternal Grandmother    Rheum arthritis Maternal Grandfather    Dementia Maternal Grandfather    Breast cancer Paternal Grandmother    Ovarian cancer Paternal Aunt    Social History   Tobacco Use   Smoking status: Every Day    Packs/day: 0.50    Pack years: 0.00    Types: Cigarettes    Start date: 01/02/2000   Smokeless tobacco: Never  Vaping Use   Vaping Use: Never used  Substance Use Topics   Alcohol use: Yes    Comment: once in a blue moon   Drug use: Never    Current Outpatient Medications:    Diethylpropion HCl CR 75 MG TB24, Take 1 tablet (75 mg total) by mouth daily before breakfast., Disp: 30 tablet, Rfl: 1   levonorgestrel (MIRENA) 20 MCG/24HR IUD, 1 each by Intrauterine route once. (Patient not taking: Reported on 07/08/2020), Disp: , Rfl:    topiramate (TOPAMAX) 25 MG tablet, Take 1 tablet (25  mg total) by mouth 2 (two) times daily. (Patient not taking: Reported on 07/08/2020), Disp: 60 tablet, Rfl: 4 Allergies: Patient has no known allergies.  Review of Systems  Constitutional:  Negative for chills, fever and malaise/fatigue.  HENT:  Negative for congestion, sinus pain and sore throat.   Eyes:  Negative for blurred vision and pain.  Respiratory:  Negative for cough and wheezing.   Cardiovascular:  Negative for chest pain and leg swelling.  Gastrointestinal:  Negative for abdominal pain, constipation, diarrhea, heartburn, nausea and vomiting.  Genitourinary:  Negative for dysuria, frequency, hematuria and urgency.  Musculoskeletal:  Negative for back pain, joint pain, myalgias and neck pain.  Skin:  Negative for itching  and rash.  Neurological:  Negative for dizziness, tremors and weakness.  Endo/Heme/Allergies:  Does not bruise/bleed easily.  Psychiatric/Behavioral:  Negative for depression. The patient is not nervous/anxious and does not have insomnia.   All other systems reviewed and are negative.  Objective: BP 100/70   Ht 5\' 6"  (1.676 m)   Wt 189 lb (85.7 kg)   BMI 30.51 kg/m   Filed Weights   07/08/20 1327  Weight: 189 lb (85.7 kg)   Physical Exam Constitutional:      General: She is not in acute distress.    Appearance: She is well-developed.  HENT:     Head: Normocephalic and atraumatic. No laceration.     Right Ear: Hearing normal.     Left Ear: Hearing normal.     Mouth/Throat:     Pharynx: Uvula midline.  Eyes:     Pupils: Pupils are equal, round, and reactive to light.  Neck:     Thyroid: No thyromegaly.  Cardiovascular:     Rate and Rhythm: Normal rate and regular rhythm.     Heart sounds: No murmur heard.   No friction rub. No gallop.  Pulmonary:     Effort: Pulmonary effort is normal. No respiratory distress.     Breath sounds: Normal breath sounds. No wheezing.  Abdominal:     General: Bowel sounds are normal. There is no distension.     Palpations: Abdomen is soft.     Tenderness: There is no abdominal tenderness. There is no rebound.  Musculoskeletal:        General: Normal range of motion.     Cervical back: Normal range of motion and neck supple.  Neurological:     Mental Status: She is alert and oriented to person, place, and time.     Cranial Nerves: No cranial nerve deficit.  Skin:    General: Skin is warm and dry.  Psychiatric:        Judgment: Judgment normal.  Vitals reviewed.    Assessment: 1. Pelvic pain   2. Menometrorrhagia   3. S/P LEEP   Plan TLH, BS, pros and cons of this approach d/w pt.  Preservation of ovaries planned and this rationale is also discussed today.  I have had a careful discussion with this patient about all the options  available and the risk/benefits of each. I have fully informed this patient that surgery may subject her to a variety of discomforts and risks: She understands that most patients have surgery with little difficulty, but problems can happen ranging from minor to fatal. These include nausea, vomiting, pain, bleeding, infection, poor healing, hernia, or formation of adhesions. Unexpected reactions may occur from any drug or anesthetic given. Unintended injury may occur to other pelvic or abdominal structures such as Fallopian tubes, ovaries,  bladder, ureter (tube from kidney to bladder), or bowel. Nerves going from the pelvis to the legs may be injured. Any such injury may require immediate or later additional surgery to correct the problem. Excessive blood loss requiring transfusion is very unlikely but possible. Dangerous blood clots may form in the legs or lungs. Physical and sexual activity will be restricted in varying degrees for an indeterminate period of time but most often 2-6 weeks.  Finally, she understands that it is impossible to list every possible undesirable effect and that the condition for which surgery is done is not always cured or significantly improved, and in rare cases may be even worse.Ample time was given to answer all questions.  Annamarie Major, MD, [facility], [facility] 07/08/2020  1:45 PM

## 2020-07-08 NOTE — Patient Instructions (Signed)
Total Laparoscopic Hysterectomy, Care After The following information offers guidance on how to care for yourself after your procedure. Your health care provider may also give you more specific instructions. If you have problems or questions, contact your health careprovider. What can I expect after the procedure? After the procedure, it is common to have: Pain, bruising, and numbness around your incisions. Tiredness (fatigue). Poor appetite. Less interest in sex. Vaginal discharge or bleeding. You will need to use a sanitary pad after this procedure. Feelings of sadness or other emotions. If your ovaries were also removed, it is also common to have symptoms of menopause, such as hot flashes, night sweats, and lack of sleep (insomnia). Follow these instructions at home: Medicines Take over-the-counter and prescription medicines only as told by your health care provider. Ask your health care provider if the medicine prescribed to you: Requires you to avoid driving or using machinery. Can cause constipation. You may need to take these actions to prevent or treat constipation: Drink enough fluid to keep your urine pale yellow. Take over-the-counter or prescription medicines. Eat foods that are high in fiber, such as beans, whole grains, and fresh fruits and vegetables. Limit foods that are high in fat and processed sugars, such as fried or sweet foods. Incision care  Follow instructions from your health care provider about how to take care of your incisions. Make sure you: Wash your hands with soap and water for at least 20 seconds before and after you change your bandage (dressing). If soap and water are not available, use hand sanitizer. Change your dressing as told by your health care provider. Leave stitches (sutures), skin glue, or adhesive strips in place. These skin closures may need to stay in place for 2 weeks or longer. If adhesive strip edges start to loosen and curl up, you may  trim the loose edges. Do not remove adhesive strips completely unless your health care provider tells you to do that. Check your incision areas every day for signs of infection. Check for: More redness, swelling, or pain. Fluid or blood. Warmth. Pus or a bad smell.  Activity  Rest as told by your health care provider. Avoid sitting for a long time without moving. Get up to take short walks every 1-2 hours. This is important to improve blood flow and breathing. Ask for help if you feel weak or unsteady. Return to your normal activities as told by your health care provider. Ask your health care provider what activities are safe for you. Do not lift anything that is heavier than 10 lb (4.5 kg), or the limit that you are told, for one month after surgery or until your health care provider says that it is safe. If you were given a sedative during the procedure, it can affect you for several hours. Do not drive or operate machinery until your health care provider says that it is safe.  Lifestyle Do not use any products that contain nicotine or tobacco. These products include cigarettes, chewing tobacco, and vaping devices, such as e-cigarettes. These can delay healing after surgery. If you need help quitting, ask your health care provider. Do not drink alcohol until your health care provider approves. General instructions  Do not douche, use tampons, or have sex for at least 6 weeks, or as told by your health care provider. If you struggle with physical or emotional changes after your procedure, speak with your health care provider or a therapist. Do not take baths, swim, or use a hot   tub until your health care provider approves. You may only be allowed to take showers for 2-3 weeks. Keep your dressing dry until your health care provider says it can be removed. Try to have someone at home with you for the first 1-2 weeks to help with your daily chores. Wear compression stockings as told by your  health care provider. These stockings help to prevent blood clots and reduce swelling in your legs. Keep all follow-up visits. This is important.  Contact a health care provider if: You have any of these signs of infection: Chills or a fever. More redness, swelling, or pain around an incision. Fluid or blood coming from an incision. Warmth coming from an incision. Pus or a bad smell coming from an incision. An incision opens. You feel dizzy or light-headed. You have pain or bleeding when you urinate, or you are unable to urinate. You have abnormal vaginal discharge. You have pain that does not get better with medicine. Get help right away if: You have a fever and your symptoms suddenly get worse. You have severe abdominal pain. You have chest pain or shortness of breath. You faint. You have pain, swelling, or redness in your leg. You have heavy vaginal bleeding with blood clots, soaking through a sanitary pad in less than 1 hour. These symptoms may represent a serious problem that is an emergency. Do not wait to see if the symptoms will go away. Get medical help right away. Call your local emergency services (911 in the [place] Do not drive yourself to the hospital. Summary After the procedure, it is common to have pain and bruising around your incisions. Do not take baths, swim, or use a hot tub until your health care provider approves. Do not lift anything that is heavier than 10 lb (4.5 kg), or the limit that you are told, for one month after surgery or until your health care provider says that it is safe. Tell your health care provider if you have any signs or symptoms of infection after the procedure. Get help right away if you have severe abdominal pain, chest pain, shortness of breath, or heavy bleeding from your vagina. This information is not intended to replace advice given to you by your health care provider. Make sure you discuss any questions you have with your healthcare  provider. Document Revised: 08/21/2019 Document Reviewed: 08/21/2019 Elsevier Patient Education  2022 [employer]  

## 2020-07-12 ENCOUNTER — Other Ambulatory Visit: Payer: Self-pay

## 2020-07-12 ENCOUNTER — Encounter
Admission: RE | Admit: 2020-07-12 | Discharge: 2020-07-12 | Disposition: A | Discharge status: Home / Self Care | Payer: Medicaid Other | Source: Ambulatory Visit | Attending: Obstetrics & Gynecology | Admitting: Obstetrics & Gynecology

## 2020-07-12 NOTE — Patient Instructions (Addendum)
Your procedure is scheduled on: July 19, 2020 TUESDAY Report to the Registration Desk on the 1st floor of the [facility] To find out your arrival time, please call [phone] between 1PM - 3PM on: July 18, 2020 Monday   REMEMBER: Instructions that are not followed completely may result in serious medical risk, up to and including death; or upon the discretion of your surgeon and anesthesiologist your surgery may need to be rescheduled.  DO NOT EAT OR DRINK after midnight the night before surgery.  No gum chewing, lozengers or hard candies.  TAKE THESE MEDICATIONS THE MORNING OF SURGERY WITH A SIP OF WATER: NONE  One week prior to surgery: Stop Anti-inflammatories (NSAIDS) such as Advil, Aleve, Ibuprofen, Motrin, Naproxen, Naprosyn and ASPIRIN OR Aspirin based products such as Excedrin, Goodys Powder, BC Powder. Stop ANY OVER THE COUNTER supplements until after surgery. You may however, continue to take Tylenol if needed for pain up until the day of surgery.  No Alcohol for 24 hours before or after surgery.  No Smoking including e-cigarettes for 24 hours prior to surgery.  No chewable tobacco products for at least 6 hours prior to surgery.  No nicotine patches on the day of surgery.  Do not use any "recreational" drugs for at least a week prior to your surgery.  Please be advised that the combination of cocaine and anesthesia may have negative outcomes, up to and including death. If you test positive for cocaine, your surgery will be cancelled.  On the morning of surgery brush your teeth with toothpaste and water, you may rinse your mouth with mouthwash if you wish. Do not swallow any toothpaste or mouthwash.  Do not wear jewelry, make-up, hairpins, clips or nail polish.  Do not wear lotions, powders, or perfumes OR DEODORANT   Do not shave body from the neck down 48 hours prior to surgery just in case you cut yourself which could leave a site for infection.  Also, freshly  shaved skin may become irritated if using the CHG soap.  Contact lenses, hearing aids and dentures may not be worn into surgery.  Do not bring valuables to the hospital. [facility] is not responsible for any missing/lost belongings or valuables.   Use CHG Soap as directed on instruction sheet.  Notify your doctor if there is any change in your medical condition (cold, fever, infection).  Wear comfortable clothing (specific to your surgery type) to the hospital.  After surgery, you can help prevent lung complications by doing breathing exercises.  Take deep breaths and cough every 1-2 hours. Your doctor may order a device called an Incentive Spirometer to help you take deep breaths. When coughing or sneezing, hold a pillow firmly against your incision with both hands. This is called "splinting." Doing this helps protect your incision. It also decreases belly discomfort.  If you are being discharged the day of surgery, you will not be allowed to drive home. You will need a responsible adult (18 years or older) to drive you home and stay with you that night.   If you are taking public transportation, you will need to have a responsible adult (18 years or older) with you. Please confirm with your physician that it is acceptable to use public transportation.   Please call the Pre-admissions Testing Dept. at [phone] if you have any questions about these instructions.  Surgery Visitation Policy:  Patients undergoing a surgery or procedure may have one family member or support person with them  as long as that person is not COVID-19 positive or experiencing its symptoms.  That person may remain in the waiting area during the procedure.  Inpatient Visitation:    Visiting hours are 7 a.m. to 8 p.m. Inpatients will be allowed two visitors daily. The visitors may change each day during the patient's stay. No visitors under the age of 26. Any visitor under the age of 45 must be accompanied  by an adult. The visitor must pass COVID-19 screenings, use hand sanitizer when entering and exiting the patient's room and wear a mask at all times, including in the patient's room. Patients must also wear a mask when staff or their visitor are in the room. Masking is required regardless of vaccination status.

## 2020-07-18 ENCOUNTER — Encounter
Admission: RE | Admit: 2020-07-18 | Discharge: 2020-07-18 | Disposition: A | Discharge status: Home / Self Care | Payer: Medicaid Other | Source: Ambulatory Visit | Attending: Obstetrics & Gynecology | Admitting: Obstetrics & Gynecology

## 2020-07-18 ENCOUNTER — Other Ambulatory Visit: Payer: Self-pay

## 2020-07-18 DIAGNOSIS — Z01812 Encounter for preprocedural laboratory examination: Secondary | ICD-10-CM | POA: Diagnosis not present

## 2020-07-18 LAB — CBC
HCT: 36.7 % (ref 36.0–46.0)
Hemoglobin: 12.4 g/dL (ref 12.0–15.0)
MCH: 31.8 pg (ref 26.0–34.0)
MCHC: 33.8 g/dL (ref 30.0–36.0)
MCV: 94.1 fL (ref 80.0–100.0)
Platelets: 253 10*3/uL (ref 150–400)
RBC: 3.9 MIL/uL (ref 3.87–5.11)
RDW: 12.9 % (ref 11.5–15.5)
WBC: 5.7 10*3/uL (ref 4.0–10.5)
nRBC: 0 % (ref 0.0–0.2)

## 2020-07-18 LAB — TYPE AND SCREEN
ABO/RH(D): A POS
Antibody Screen: NEGATIVE

## 2020-07-18 MED ORDER — SODIUM CHLORIDE 0.9 % IV SOLN
2.0000 g | INTRAVENOUS | Status: AC
  Administered 2020-07-19: 2 g via INTRAVENOUS

## 2020-07-18 MED ORDER — POVIDONE-IODINE 10 % EX SWAB
2.0000 "application " | Freq: Once | CUTANEOUS | Status: AC
  Administered 2020-07-19: 2 via TOPICAL

## 2020-07-18 MED ORDER — LACTATED RINGERS IV SOLN: INTRAVENOUS | Status: DC

## 2020-07-18 MED ORDER — FAMOTIDINE 20 MG PO TABS: 20.0000 mg | ORAL_TABLET | Freq: Once | ORAL | Status: AC

## 2020-07-19 ENCOUNTER — Encounter
Admission: RE | Disposition: A | Discharge status: Home / Self Care | Payer: Self-pay | Source: Home / Self Care | Attending: Obstetrics & Gynecology

## 2020-07-19 ENCOUNTER — Ambulatory Visit: Payer: Medicaid Other | Admitting: Anesthesiology

## 2020-07-19 ENCOUNTER — Other Ambulatory Visit: Payer: Self-pay

## 2020-07-19 ENCOUNTER — Encounter: Payer: Self-pay | Admitting: Obstetrics & Gynecology

## 2020-07-19 ENCOUNTER — Observation Stay
Admission: RE | Admit: 2020-07-19 | Discharge: 2020-07-20 | Disposition: A | Discharge status: Home / Self Care | Payer: Medicaid Other | Attending: Obstetrics & Gynecology | Admitting: Obstetrics & Gynecology

## 2020-07-19 ENCOUNTER — Ambulatory Visit: Payer: Medicaid Other

## 2020-07-19 DIAGNOSIS — D069 Carcinoma in situ of cervix, unspecified: Secondary | ICD-10-CM | POA: Diagnosis not present

## 2020-07-19 DIAGNOSIS — N920 Excessive and frequent menstruation with regular cycle: Secondary | ICD-10-CM | POA: Diagnosis present

## 2020-07-19 DIAGNOSIS — K660 Peritoneal adhesions (postprocedural) (postinfection): Secondary | ICD-10-CM | POA: Insufficient documentation

## 2020-07-19 DIAGNOSIS — Z20822 Contact with and (suspected) exposure to covid-19: Secondary | ICD-10-CM | POA: Insufficient documentation

## 2020-07-19 DIAGNOSIS — N838 Other noninflammatory disorders of ovary, fallopian tube and broad ligament: Secondary | ICD-10-CM | POA: Diagnosis not present

## 2020-07-19 DIAGNOSIS — F17210 Nicotine dependence, cigarettes, uncomplicated: Secondary | ICD-10-CM | POA: Insufficient documentation

## 2020-07-19 DIAGNOSIS — N1330 Unspecified hydronephrosis: Secondary | ICD-10-CM | POA: Insufficient documentation

## 2020-07-19 DIAGNOSIS — R102 Pelvic and perineal pain: Secondary | ICD-10-CM | POA: Diagnosis not present

## 2020-07-19 DIAGNOSIS — Z90710 Acquired absence of both cervix and uterus: Secondary | ICD-10-CM | POA: Diagnosis present

## 2020-07-19 DIAGNOSIS — N800 Endometriosis of uterus: Principal | ICD-10-CM | POA: Insufficient documentation

## 2020-07-19 DIAGNOSIS — N72 Inflammatory disease of cervix uteri: Secondary | ICD-10-CM | POA: Insufficient documentation

## 2020-07-19 DIAGNOSIS — N941 Unspecified dyspareunia: Secondary | ICD-10-CM | POA: Diagnosis not present

## 2020-07-19 DIAGNOSIS — J45909 Unspecified asthma, uncomplicated: Secondary | ICD-10-CM | POA: Insufficient documentation

## 2020-07-19 DIAGNOSIS — N9419 Other specified dyspareunia: Secondary | ICD-10-CM | POA: Insufficient documentation

## 2020-07-19 DIAGNOSIS — N134 Hydroureter: Secondary | ICD-10-CM

## 2020-07-19 DIAGNOSIS — N939 Abnormal uterine and vaginal bleeding, unspecified: Secondary | ICD-10-CM | POA: Diagnosis present

## 2020-07-19 DIAGNOSIS — N921 Excessive and frequent menstruation with irregular cycle: Secondary | ICD-10-CM | POA: Diagnosis not present

## 2020-07-19 HISTORY — PX: CYSTOSCOPY: SHX5120

## 2020-07-19 HISTORY — PX: TOTAL LAPAROSCOPIC HYSTERECTOMY WITH SALPINGECTOMY: SHX6742

## 2020-07-19 HISTORY — PX: CYSTOSCOPY W/ URETERAL STENT PLACEMENT: SHX1429

## 2020-07-19 HISTORY — PX: CYSTOSCOPY W/ RETROGRADES: SHX1426

## 2020-07-19 LAB — RESP PANEL BY RT-PCR (FLU A&B, COVID) ARPGX2
Influenza A by PCR: NEGATIVE
Influenza B by PCR: NEGATIVE
SARS Coronavirus 2 by RT PCR: NEGATIVE

## 2020-07-19 LAB — POCT PREGNANCY, URINE: Preg Test, Ur: NEGATIVE

## 2020-07-19 SURGERY — HYSTERECTOMY, TOTAL, LAPAROSCOPIC, WITH SALPINGECTOMY
Anesthesia: General | Laterality: Right

## 2020-07-19 MED ORDER — HYDROMORPHONE HCL 1 MG/ML IJ SOLN
INTRAMUSCULAR | Status: AC
  Administered 2020-07-19: 0.5 mg via INTRAVENOUS
  Filled 2020-07-19: qty 1

## 2020-07-19 MED ORDER — TAMSULOSIN HCL 0.4 MG PO CAPS
0.4000 mg | ORAL_CAPSULE | Freq: Every day | ORAL | 1 refills | Status: DC
Start: 2020-07-19 — End: 2020-09-20

## 2020-07-19 MED ORDER — OXYCODONE HCL 5 MG PO TABS
ORAL_TABLET | ORAL | Status: AC
  Filled 2020-07-19: qty 1

## 2020-07-19 MED ORDER — ACETAMINOPHEN 10 MG/ML IV SOLN
1000.0000 mg | Freq: Once | INTRAVENOUS | Status: DC | PRN
  Administered 2020-07-19: 1000 mg via INTRAVENOUS

## 2020-07-19 MED ORDER — HYDROMORPHONE HCL 1 MG/ML IJ SOLN
0.5000 mg | INTRAMUSCULAR | Status: AC | PRN
  Administered 2020-07-19 (×2): 0.5 mg via INTRAVENOUS

## 2020-07-19 MED ORDER — ORAL CARE MOUTH RINSE: 15.0000 mL | Freq: Once | OROMUCOSAL | Status: AC

## 2020-07-19 MED ORDER — PROPOFOL 500 MG/50ML IV EMUL
INTRAVENOUS | Status: AC
  Filled 2020-07-19: qty 50

## 2020-07-19 MED ORDER — IOHEXOL 180 MG/ML  SOLN
INTRAMUSCULAR | Status: DC | PRN
  Administered 2020-07-19: 15 mL

## 2020-07-19 MED ORDER — PROPOFOL 500 MG/50ML IV EMUL
INTRAVENOUS | Status: DC | PRN
  Administered 2020-07-19: 125 ug/kg/min via INTRAVENOUS

## 2020-07-19 MED ORDER — 0.9 % SODIUM CHLORIDE (POUR BTL) OPTIME
TOPICAL | Status: DC | PRN
  Administered 2020-07-19 (×2): 500 mL
  Administered 2020-07-19: 300 mL

## 2020-07-19 MED ORDER — KETOROLAC TROMETHAMINE 30 MG/ML IJ SOLN
INTRAMUSCULAR | Status: DC | PRN
  Administered 2020-07-19: 15 mg via INTRAVENOUS

## 2020-07-19 MED ORDER — ONDANSETRON HCL 4 MG/2ML IJ SOLN: 4.0000 mg | INTRAMUSCULAR | Status: DC | PRN

## 2020-07-19 MED ORDER — TAMSULOSIN HCL 0.4 MG PO CAPS
0.4000 mg | ORAL_CAPSULE | Freq: Every day | ORAL | Status: DC
  Administered 2020-07-19 – 2020-07-20 (×2): 0.4 mg via ORAL
  Filled 2020-07-19 (×2): qty 1

## 2020-07-19 MED ORDER — PROMETHAZINE HCL 25 MG RE SUPP
25.0000 mg | Freq: Once | RECTAL | Status: AC | PRN
  Administered 2020-07-19: 25 mg via RECTAL
  Filled 2020-07-19: qty 1

## 2020-07-19 MED ORDER — CHLORHEXIDINE GLUCONATE 0.12 % MT SOLN
OROMUCOSAL | Status: AC
  Administered 2020-07-19: 15 mL via OROMUCOSAL
  Filled 2020-07-19: qty 15

## 2020-07-19 MED ORDER — FAMOTIDINE 20 MG PO TABS
ORAL_TABLET | ORAL | Status: AC
  Administered 2020-07-19: 20 mg via ORAL
  Filled 2020-07-19: qty 1

## 2020-07-19 MED ORDER — CHLORHEXIDINE GLUCONATE 0.12 % MT SOLN: 15.0000 mL | Freq: Once | OROMUCOSAL | Status: AC

## 2020-07-19 MED ORDER — MIRABEGRON ER 25 MG PO TB24
25.0000 mg | ORAL_TABLET | Freq: Every day | ORAL | Status: DC
  Administered 2020-07-19 – 2020-07-20 (×2): 25 mg via ORAL
  Filled 2020-07-19 (×3): qty 1

## 2020-07-19 MED ORDER — OXYCODONE-ACETAMINOPHEN 5-325 MG PO TABS: 1.0000 | ORAL_TABLET | ORAL | 0 refills | Status: DC | PRN

## 2020-07-19 MED ORDER — SODIUM CHLORIDE 0.9 % IV SOLN
INTRAVENOUS | Status: AC
  Filled 2020-07-19: qty 2

## 2020-07-19 MED ORDER — FLUORESCEIN SODIUM 10 % IV SOLN
INTRAVENOUS | Status: DC | PRN
  Administered 2020-07-19: 50 mg via INTRAVENOUS

## 2020-07-19 MED ORDER — FENTANYL CITRATE (PF) 100 MCG/2ML IJ SOLN
INTRAMUSCULAR | Status: DC | PRN
  Administered 2020-07-19 (×2): 50 ug via INTRAVENOUS
  Administered 2020-07-19: 100 ug via INTRAVENOUS

## 2020-07-19 MED ORDER — FENTANYL CITRATE (PF) 100 MCG/2ML IJ SOLN
INTRAMUSCULAR | Status: AC
  Filled 2020-07-19: qty 2

## 2020-07-19 MED ORDER — FENTANYL CITRATE (PF) 100 MCG/2ML IJ SOLN
25.0000 ug | INTRAMUSCULAR | Status: AC | PRN
  Administered 2020-07-19 (×4): 25 ug via INTRAVENOUS

## 2020-07-19 MED ORDER — OXYCODONE-ACETAMINOPHEN 5-325 MG PO TABS
1.0000 | ORAL_TABLET | ORAL | Status: DC | PRN
  Administered 2020-07-19 – 2020-07-20 (×4): 1 via ORAL
  Filled 2020-07-19 (×4): qty 1

## 2020-07-19 MED ORDER — MIRABEGRON ER 25 MG PO TB24: 25.0000 mg | ORAL_TABLET | Freq: Every day | ORAL | 1 refills | Status: DC

## 2020-07-19 MED ORDER — MORPHINE SULFATE (PF) 2 MG/ML IV SOLN
INTRAVENOUS | Status: AC
  Administered 2020-07-19: 1 mg via INTRAVENOUS
  Filled 2020-07-19: qty 1

## 2020-07-19 MED ORDER — OXYCODONE HCL 5 MG PO TABS
ORAL_TABLET | ORAL | Status: AC
  Administered 2020-07-19: 5 mg via ORAL
  Filled 2020-07-19: qty 1

## 2020-07-19 MED ORDER — BELLADONNA ALKALOIDS-OPIUM 16.2-60 MG RE SUPP
1.0000 | Freq: Three times a day (TID) | RECTAL | Status: DC | PRN
  Administered 2020-07-20: 1 via RECTAL
  Filled 2020-07-19 (×3): qty 1

## 2020-07-19 MED ORDER — PROPOFOL 10 MG/ML IV BOLUS
INTRAVENOUS | Status: AC
  Filled 2020-07-19: qty 40

## 2020-07-19 MED ORDER — MIDAZOLAM HCL 2 MG/2ML IJ SOLN
INTRAMUSCULAR | Status: DC | PRN
  Administered 2020-07-19: 2 mg via INTRAVENOUS

## 2020-07-19 MED ORDER — LACTATED RINGERS IV SOLN: INTRAVENOUS | Status: DC

## 2020-07-19 MED ORDER — FENTANYL CITRATE (PF) 100 MCG/2ML IJ SOLN
25.0000 ug | INTRAMUSCULAR | Status: DC | PRN
  Administered 2020-07-19 (×3): 25 ug via INTRAVENOUS

## 2020-07-19 MED ORDER — ONDANSETRON HCL 4 MG/2ML IJ SOLN
4.0000 mg | Freq: Once | INTRAMUSCULAR | Status: AC | PRN
  Administered 2020-07-19: 4 mg via INTRAVENOUS
  Filled 2020-07-19: qty 2

## 2020-07-19 MED ORDER — ROCURONIUM BROMIDE 100 MG/10ML IV SOLN
INTRAVENOUS | Status: DC | PRN
  Administered 2020-07-19: 20 mg via INTRAVENOUS
  Administered 2020-07-19: 50 mg via INTRAVENOUS
  Administered 2020-07-19: 20 mg via INTRAVENOUS

## 2020-07-19 MED ORDER — FENTANYL CITRATE (PF) 100 MCG/2ML IJ SOLN
INTRAMUSCULAR | Status: AC
  Administered 2020-07-19: 25 ug via INTRAVENOUS
  Filled 2020-07-19: qty 2

## 2020-07-19 MED ORDER — BUPIVACAINE HCL (PF) 0.5 % IJ SOLN
INTRAMUSCULAR | Status: AC
  Filled 2020-07-19: qty 30

## 2020-07-19 MED ORDER — MIDAZOLAM HCL 2 MG/2ML IJ SOLN
INTRAMUSCULAR | Status: AC
  Filled 2020-07-19: qty 2

## 2020-07-19 MED ORDER — BUPIVACAINE HCL (PF) 0.5 % IJ SOLN
INTRAMUSCULAR | Status: DC | PRN
  Administered 2020-07-19: 14 mL

## 2020-07-19 MED ORDER — SUGAMMADEX SODIUM 200 MG/2ML IV SOLN
INTRAVENOUS | Status: DC | PRN
  Administered 2020-07-19: 200 mg via INTRAVENOUS

## 2020-07-19 MED ORDER — OXYCODONE HCL 5 MG PO TABS: 5.0000 mg | ORAL_TABLET | ORAL | Status: AC

## 2020-07-19 MED ORDER — PHENYLEPHRINE HCL (PRESSORS) 10 MG/ML IV SOLN
INTRAVENOUS | Status: AC
  Filled 2020-07-19: qty 1

## 2020-07-19 MED ORDER — ACETAMINOPHEN 650 MG RE SUPP
650.0000 mg | RECTAL | Status: DC | PRN
  Filled 2020-07-19: qty 1

## 2020-07-19 MED ORDER — ACETAMINOPHEN 10 MG/ML IV SOLN
INTRAVENOUS | Status: AC
  Filled 2020-07-19: qty 100

## 2020-07-19 MED ORDER — PROPOFOL 10 MG/ML IV BOLUS
INTRAVENOUS | Status: DC | PRN
  Administered 2020-07-19: 150 mg via INTRAVENOUS

## 2020-07-19 MED ORDER — MORPHINE SULFATE (PF) 2 MG/ML IV SOLN
1.0000 mg | INTRAVENOUS | Status: DC | PRN
  Administered 2020-07-19 – 2020-07-20 (×4): 1 mg via INTRAVENOUS
  Filled 2020-07-19 (×4): qty 1

## 2020-07-19 MED ORDER — OXYCODONE HCL 5 MG PO TABS
5.0000 mg | ORAL_TABLET | Freq: Once | ORAL | Status: AC | PRN
Start: 2020-07-19 — End: 2020-07-19
  Administered 2020-07-19: 5 mg via ORAL

## 2020-07-19 MED ORDER — FLUORESCEIN SODIUM 10 % IV SOLN
INTRAVENOUS | Status: AC
  Filled 2020-07-19: qty 5

## 2020-07-19 MED ORDER — PROMETHAZINE HCL 25 MG RE SUPP: 25.0000 mg | Freq: Once | RECTAL | Status: DC

## 2020-07-19 MED ORDER — OXYCODONE HCL 5 MG/5ML PO SOLN: 5.0000 mg | Freq: Once | ORAL | Status: AC | PRN

## 2020-07-19 MED ORDER — ACETAMINOPHEN 325 MG PO TABS: 650.0000 mg | ORAL_TABLET | ORAL | Status: DC | PRN

## 2020-07-19 MED ORDER — BELLADONNA ALKALOIDS-OPIUM 16.2-60 MG RE SUPP
RECTAL | Status: AC
  Administered 2020-07-19: 1 via RECTAL
  Filled 2020-07-19: qty 1

## 2020-07-19 MED ORDER — PHENYLEPHRINE HCL (PRESSORS) 10 MG/ML IV SOLN
INTRAVENOUS | Status: DC | PRN
Start: 2020-07-19 — End: 2020-07-19
  Administered 2020-07-19: 50 ug via INTRAVENOUS
  Administered 2020-07-19 (×3): 100 ug via INTRAVENOUS

## 2020-07-19 MED ORDER — LIDOCAINE HCL (CARDIAC) PF 100 MG/5ML IV SOSY
PREFILLED_SYRINGE | INTRAVENOUS | Status: DC | PRN
  Administered 2020-07-19: 100 mg via INTRAVENOUS

## 2020-07-19 MED ORDER — DEXMEDETOMIDINE HCL IN NACL 200 MCG/50ML IV SOLN
INTRAVENOUS | Status: DC | PRN
  Administered 2020-07-19 (×2): 8 ug via INTRAVENOUS
  Administered 2020-07-19: 4 ug via INTRAVENOUS

## 2020-07-19 MED ORDER — DEXAMETHASONE SODIUM PHOSPHATE 10 MG/ML IJ SOLN
INTRAMUSCULAR | Status: DC | PRN
  Administered 2020-07-19: 10 mg via INTRAVENOUS

## 2020-07-19 MED ORDER — FENTANYL CITRATE (PF) 100 MCG/2ML IJ SOLN
INTRAMUSCULAR | Status: AC
  Administered 2020-07-19: 50 ug via INTRAVENOUS
  Filled 2020-07-19: qty 2

## 2020-07-19 MED ORDER — ONDANSETRON HCL 4 MG/2ML IJ SOLN
INTRAMUSCULAR | Status: DC | PRN
  Administered 2020-07-19: 4 mg via INTRAVENOUS

## 2020-07-19 SURGICAL SUPPLY — 59 items
APPLICATOR ARISTA FLEXITIP XL (MISCELLANEOUS) ×1 IMPLANT
BACTOSHIELD CHG 4% 4OZ (MISCELLANEOUS) ×1
BAG URINE DRAIN 2000ML AR STRL (UROLOGICAL SUPPLIES) ×4 IMPLANT
BLADE SURG SZ11 CARB STEEL (BLADE) ×4 IMPLANT
CANISTER SUCT 1200ML W/VALVE (MISCELLANEOUS) ×4 IMPLANT
CATH FOLEY 2WAY  5CC 16FR (CATHETERS) ×1
CATH URTH 16FR FL 2W BLN LF (CATHETERS) ×3 IMPLANT
CHLORAPREP W/TINT 26 (MISCELLANEOUS) ×4 IMPLANT
DEFOGGER SCOPE WARMER CLEARIFY (MISCELLANEOUS) ×4 IMPLANT
DERMABOND ADVANCED (GAUZE/BANDAGES/DRESSINGS) ×1
DERMABOND ADVANCED .7 DNX12 (GAUZE/BANDAGES/DRESSINGS) ×3 IMPLANT
DEVICE SUTURE ENDOST 10MM (ENDOMECHANICALS) ×1 IMPLANT
DRAPE CAMERA CLOSED 9X96 (DRAPES) ×4 IMPLANT
DRAPE UNDER BUTTOCK W/FLU (DRAPES) ×1 IMPLANT
DRSG TEGADERM 2-3/8X2-3/4 SM (GAUZE/BANDAGES/DRESSINGS) IMPLANT
GAUZE 4X4 16PLY ~~LOC~~+RFID DBL (SPONGE) ×8 IMPLANT
GLOVE SURG ENC MOIS LTX SZ8 (GLOVE) ×12 IMPLANT
GLOVE SURG UNDER LTX SZ8 (GLOVE) ×12 IMPLANT
GOWN STRL REUS W/ TWL LRG LVL3 (GOWN DISPOSABLE) ×12 IMPLANT
GOWN STRL REUS W/ TWL XL LVL3 (GOWN DISPOSABLE) ×9 IMPLANT
GOWN STRL REUS W/TWL LRG LVL3 (GOWN DISPOSABLE) ×4
GOWN STRL REUS W/TWL XL LVL3 (GOWN DISPOSABLE) ×3
GRASPER SUT TROCAR 14GX15 (MISCELLANEOUS) ×4 IMPLANT
HEMOSTAT ARISTA ABSORB 3G PWDR (HEMOSTASIS) ×1 IMPLANT
IRRIGATION STRYKERFLOW (MISCELLANEOUS) ×3 IMPLANT
IRRIGATOR STRYKERFLOW (MISCELLANEOUS) ×4
IV LACTATED RINGERS 1000ML (IV SOLUTION) ×8 IMPLANT
KIT PINK PAD W/HEAD ARE REST (MISCELLANEOUS) ×4
KIT PINK PAD W/HEAD ARM REST (MISCELLANEOUS) ×3 IMPLANT
KIT TURNOVER CYSTO (KITS) ×4 IMPLANT
LABEL OR SOLS (LABEL) ×4 IMPLANT
MANIFOLD NEPTUNE II (INSTRUMENTS) ×4 IMPLANT
MANIPULATOR VCARE LG CRV RETR (MISCELLANEOUS) IMPLANT
MANIPULATOR VCARE SML CRV RETR (MISCELLANEOUS) IMPLANT
MANIPULATOR VCARE STD CRV RETR (MISCELLANEOUS) ×1 IMPLANT
NEEDLE VERESS 14GA 120MM (NEEDLE) ×4 IMPLANT
NS IRRIG 500ML POUR BTL (IV SOLUTION) ×4 IMPLANT
OCCLUDER COLPOPNEUMO (BALLOONS) ×4 IMPLANT
PACK CYSTO (CUSTOM PROCEDURE TRAY) ×1 IMPLANT
PACK GYN LAPAROSCOPIC (MISCELLANEOUS) ×4 IMPLANT
PAD OB MATERNITY 4.3X12.25 (PERSONAL CARE ITEMS) ×4 IMPLANT
PAD PREP 24X41 OB/GYN DISP (PERSONAL CARE ITEMS) ×4 IMPLANT
PORT ACCESS TROCAR AIRSEAL 12 (TROCAR) ×3 IMPLANT
PORT ACCESS TROCAR AIRSEAL 5M (TROCAR) ×1
SCISSORS METZENBAUM CVD 33 (INSTRUMENTS) ×1 IMPLANT
SCRUB CHG 4% DYNA-HEX 4OZ (MISCELLANEOUS) ×3 IMPLANT
SET CYSTO W/LG BORE CLAMP LF (SET/KITS/TRAYS/PACK) ×4 IMPLANT
SET TRI-LUMEN FLTR TB AIRSEAL (TUBING) ×4 IMPLANT
SHEARS HARMONIC ACE PLUS 36CM (ENDOMECHANICALS) ×4 IMPLANT
SLEEVE ENDOPATH XCEL 5M (ENDOMECHANICALS) ×4 IMPLANT
SPONGE GAUZE 2X2 8PLY STRL LF (GAUZE/BANDAGES/DRESSINGS) ×2 IMPLANT
STENT URET 6FRX24 CONTOUR (STENTS) ×1 IMPLANT
SUT ENDO VLOC 180-0-8IN (SUTURE) ×1 IMPLANT
SUT VIC AB 0 CT1 36 (SUTURE) ×4 IMPLANT
SUT VIC AB 4-0 FS2 27 (SUTURE) ×4 IMPLANT
SYR 10ML LL (SYRINGE) ×4 IMPLANT
SYR 50ML LL SCALE MARK (SYRINGE) ×4 IMPLANT
TROCAR PORT AIRSEAL 8X100 (TROCAR) ×4 IMPLANT
TROCAR XCEL NON-BLD 5MMX100MML (ENDOMECHANICALS) ×4 IMPLANT

## 2020-07-19 NOTE — Op Note (Addendum)
Operative Report:  PRE-OP DIAGNOSIS: Pain R10.2, Dyspareunia N94.19, Menometrorrhagia N92.1, history of CIN 3   POST-OP DIAGNOSIS: Same  PROCEDURE: Procedure(s): TOTAL LAPAROSCOPIC HYSTERECTOMY WITH SALPINGECTOMY CYSTOSCOPY CYSTOSCOPY WITH RETROGRADE PYELOGRAM CYSTOSCOPY WITH RETROGRADE PYELOGRAM/URETERAL STENT PLACEMENT  SURGEON: Annamarie Major, MD, FACOG  ASSISTANT: Dr Jerene Pitch, No other capable assistant available, in surgery requiring high level assistant.  ANESTHESIA: General endotracheal anesthesia  ESTIMATED BLOOD LOSS: less than 50   SPECIMENS: Uterus, Tubes.  COMPLICATIONS: None  DISPOSITION: stable to PACU  FINDINGS: Intraabdominal adhesions were not noted.  Cystoscopy revealed poor (no) flow through right ureter.  Dr Lonna Cobb of Urology performed pyelogram and stent placement, with no ureteral transection or injury noted, potential external compression of ureter.  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  PROCEDURE:  The patient was taken to the OR where anesthesia was administed. She was prepped and draped in the normal sterile fashion in the dorsal lithotomy position in the [place] stirrups. A time out was performed. A Graves speculum was inserted, the cervix was grasped with a single tooth tenaculum and the endometrial cavity was sounded. The cervix was progressively dilated to a size 18 [place] with [employer] dilators. A V-Care uterine manipulator was inserted in the usual fashion without incident. Gloves were changed and attention was turned to the abdomen.   An infraumbilical transverse 24mm skin incision was made with the scalpel after local anesthesia applied to the skin. A Veress-step needle was inserted in the usual fashion and confirmed using the hanging drop technique. A pneumoperitoneum was obtained by insufflation of CO2 (opening pressure of ) to . An 8 mm trocar is then placed under direct visualization with the laparoscope. A  diagnostic laparoscopy was performed yielding the previously described findings. Attention was turned to the left lower quadrant where after visualization of the inferior epigastric vessels a 64mm skin incision was made with the scalpel. A 5 mm laparoscopic port was inserted. The same procedure was repeated in the right lower quadrant with a 29mm trocar. Attention was turned to the left aspect of the uterus, where after visualization of the ureter, the round ligament was coagulated and transected using the 87mm Harmonic Scapel. The anterior and posterior leafs of the broad ligament were dissected off as the anterior one was coagulated and transected in a caudal direction towards the cuff of the uterine manipulator.  Attention was then turned to the left fallopian tube which was recognized by visualization of the fimbria. The tube is excised to its attachment to the uterus. The uterine-ovarian ligament and its blood vessels were carefully coagulated and transected using the Harmonic scapel.  Attention was turned to the right aspect of the uterus where the same procedure was performed.  The vesicouterine reflection of the peritoneum was dissected with the harmonic scapel and the bladder flap was created bluntly.  The uterine vessels were coagulated and transected bilaterally using first bipolar cautery and then the harmonic scapel. A 360 degree, circumferential colpotomy was done to completely amputate the uterus with cervix and tubes. Once the specimen was amputated it was delivered through the vagina.   The colpotomy was repaired in a simple running fashion using a delayed absorbable suture with an endo-stitch device.  Vaginal exam confirms complete closure.  The cavity was copiously irrigated. A survey of the pelvic cavity revealed adequate hemostasis and no injury to bowel, bladder, or ureter.  The assistance of my assisting-physician was vital to resect and retract interchangably with self on each side.  A  diagnostic cystoscopy was performed using saline distension of bladder with above mentioned findings noted.  Urine flow from left ureteral orifice is visualized.  See notes from Dr Lonna Cobb regarding right ureter.  At this point the procedure was finalized. The umbilical fascia incision is closed with a vicryl suture using the fascia closure device. All the instruments were removed from the patient's body. Gas was expelled and patient is leveled.  Incisions are closed with skin adhesive.    Patient goes to recovery room in stable condition.  All sponge, instrument, and needle counts are correct x2.     Annamarie Major, MD, [facility], [facility] 07/19/2020  10:46 AM

## 2020-07-19 NOTE — Progress Notes (Signed)
Patient brought to room 348 from PACU.

## 2020-07-19 NOTE — Anesthesia Preprocedure Evaluation (Signed)
Anesthesia Evaluation  Patient identified by MRN, date of birth, ID band Patient awake    Reviewed: Allergy & Precautions, NPO status , Patient's Chart, lab work & pertinent test results  History of Anesthesia Complications Negative for: history of anesthetic complications  Airway Mallampati: I  TM Distance: >3 FB Neck ROM: Full    Dental no notable dental hx. (+) Teeth Intact   Pulmonary asthma , neg sleep apnea, neg COPD, Patient abstained from smoking.Not current smoker, former smoker,  No inhaler use in a year. Patient hospitalized as a child for asthma but not as an adult   Pulmonary exam normal breath sounds clear to auscultation       Cardiovascular Exercise Tolerance: Good METS(-) hypertension(-) CAD and (-) Past MI negative cardio ROS  (-) dysrhythmias  Rhythm:Regular Rate:Normal - Systolic murmurs    Neuro/Psych PSYCHIATRIC DISORDERS Anxiety Depression negative neurological ROS     GI/Hepatic neg GERD  ,(+)     (-) substance abuse  ,   Endo/Other  neg diabetes  Renal/GU negative Renal ROS     Musculoskeletal   Abdominal   Peds  Hematology   Anesthesia Other Findings Past Medical History: 08/29/2017: AMA (advanced maternal age) multigravida 37+, second  trimester No date: Anemia No date: Anxiety No date: Asthma 03/20/2018: Breech presentation delivered 04/03/2018: Cesarean delivery indicated due to breech presentation No date: Family history of breast cancer     Comment:  4/22 cancer genetic testing letter sent No date: Family history of ovarian cancer 08/29/2017: High-risk pregnancy, third trimester     Comment:  [facility] Prenatal Labs Dating [place] Blood type:               A/Positive/-- (07/19 0000) A + Genetic Screen NIPS:               Normal 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 Antibody:  Anatomic [place]  complete Rubella:               Immune (07/19  0000)  Varicella: Immune GTT Early:               WNLThird trimester:  RPR: Nonreactive (07/19 0000)                Rhogam  not needed HBsAg: Negative (07/19 0000)  TDaP               vaccine                   01/08/2018: Iron deficiency anemia  Reproductive/Obstetrics                             Anesthesia Physical Anesthesia Plan  ASA: 2  Anesthesia Plan: General   Post-op Pain Management:    Induction: Intravenous  PONV Risk Score and Plan: 4 or greater and Ondansetron, Dexamethasone, Propofol infusion, TIVA and Midazolam  Airway Management Planned: Oral ETT  Additional Equipment: None  Intra-op Plan:   Post-operative Plan: Extubation in OR  Informed Consent: I have reviewed the patients History and Physical, chart, labs and discussed the procedure including the risks, benefits and alternatives for the proposed anesthesia with the patient or authorized representative who has indicated his/her understanding and acceptance.     Dental advisory given  Plan Discussed with: CRNA and Surgeon  Anesthesia Plan Comments: (  Discussed risks of anesthesia with patient, including PONV, sore throat, lip/dental damage. Rare risks discussed as well, such as cardiorespiratory and neurological sequelae. Patient understands.)        Anesthesia Quick Evaluation

## 2020-07-19 NOTE — Interval H&P Note (Signed)
History and Physical Interval Note:  07/19/2020 7:05 AM  Kristen Mcpherson  has presented today for surgery, with the diagnosis of Pain R10.2, Dyspareunia N94.19, Menometrorrhagia N92.1.  The various methods of treatment have been discussed with the patient and family. After consideration of risks, benefits and other options for treatment, the patient has consented to  Procedure(s): TOTAL LAPAROSCOPIC HYSTERECTOMY WITH SALPINGECTOMY (Bilateral) as a surgical intervention.  The patient's history has been reviewed, patient examined, no change in status, stable for surgery.  I have reviewed the patient's chart and labs.  Questions were answered to the patient's satisfaction.     Letitia Libra

## 2020-07-19 NOTE — Op Note (Signed)
Preoperative diagnosis:  Status post total laparoscopic hysterectomy with salpingectomy; no efflux visualized right ureteral orifice  Postoperative diagnosis:  Right hydronephrosis/hydroureter-moderate to severe  Procedure: Cystoscopy with right retrograde pyelogram Right ureteroscopy-diagnostic Placement right ureteral stent  Surgeon: Riki Altes, MD  Anesthesia: General  Complications: None  Intraoperative findings:  Cystoscopy-orthotopic UOs; no efflux seen right UO.  No abnormal bladder mucosal lesions Right retrograde pyelogram-moderate to severe right hydronephrosis/hydroureter with tapering at the midportion of the right distal ureter.  No contrast extravasation Right ureteroscopy-no intrinsic abnormalities noted including disruption mucosal integrity, ureteral stricture or ureteral calculus.  Able to traverse the distal ureter easily with ureteroscope however on withdrawal of the scope in the midportion of the distal ureter there was prompt segmental closing  EBL: None  Specimens: None  Indication: Kristen Mcpherson is a 40 y.o. with pelvic pain, dyspareunia, menometrorrhagia scheduled for total laparoscopic hysterectomy with salpingectomy by Dr. Tiburcio Pea with no intraoperative problems during the procedure.  Cystoscopy performed showed no efflux from the right UO.  She was given IV fluorescein and no efflux was noted.  Cystoscopy with right retrograde pyelogram was recommended as initial evaluation.  Description of procedure:  The patient required transfer to a slider table to allow C arm access for retrograde pyelogram.  Once transferred she was placed in the low lithotomy position and her external genitalia were prepped and draped in usual fashion.  A preoperative time-out was performed.   A 21 French cystoscope was lubricated and passed per urethra.  Panendoscopy was performed with findings as described above.  Brisk fluorescein tinged efflux was seen from the  left UO and no efflux from the right.  A 5 French open-ended ureteral catheter was placed the cystoscope and positioned at the right UO.  A 0.038 Sensor wire was placed through the catheter and into the ureteral orifice without resistance and the wire appeared to traverse the ureter under fluoroscopy.  The ureteral catheter was advanced over the guidewire to the distal ureter; the guidewire was removed and retrograde pyelogram was performed with findings as described above.  The ureteral catheter was removed and a 4.5 French semirigid ureteroscope was passed per urethra.  The right UO was easily accessed with ureteroscope which was advanced proximally with findings as described above.  Above the level of obstruction there was marked hydroureter.  Her degree of hydronephrosis and hydroureter were more than I would expect for acute obstruction.  We did discuss the possibility of partial occlusion of the ureter with vaginal cuff closure.  I recommended placement of a right ureteral stent.  The proximal end of the sensor wire was positioned in the renal pelvis and a 6FR/24 cm Contour ureteral stent was placed without difficulty.  Good positioning was noted in the renal pelvis under fluoroscopy and in the bladder under direct vision.   Recommendation: No prior GU imaging noted on review of epic Recommend CT abdomen/pelvis to evaluate for any obvious sources of extrinsic ureteral obstruction If CT negative would leave stent indwelling for at least 6 weeks to allow vaginal cuff sutures to dissolve Would recommend stent removal under sedation with repeat retrograde pyelogram   Riki Altes, M.D.

## 2020-07-19 NOTE — Anesthesia Postprocedure Evaluation (Signed)
Anesthesia Post Note  Patient: Kristen Mcpherson  Procedure(s) Performed: TOTAL LAPAROSCOPIC HYSTERECTOMY WITH SALPINGECTOMY (Bilateral) CYSTOSCOPY CYSTOSCOPY WITH RETROGRADE PYELOGRAM CYSTOSCOPY WITH RETROGRADE PYELOGRAM/URETERAL STENT PLACEMENT (Right)  Patient location during evaluation: PACU Anesthesia Type: General Level of consciousness: awake and alert and oriented Pain management: pain level controlled Vital Signs Assessment: post-procedure vital signs reviewed and stable Respiratory status: spontaneous breathing, nonlabored ventilation and respiratory function stable Cardiovascular status: blood pressure returned to baseline and stable Postop Assessment: no signs of nausea or vomiting Anesthetic complications: no   No notable events documented.   Last Vitals:  Vitals:   07/19/20 1245 07/19/20 1345  BP: (!) 93/56 (!) 88/47  Pulse: (!) 58 (!) 55  Resp: 14 12  Temp:  (!) 36.4 C  SpO2: 100% 95%    Last Pain:  Vitals:   07/19/20 1345  TempSrc:   PainSc: 6                  [name]

## 2020-07-19 NOTE — Transfer of Care (Signed)
Immediate Anesthesia Transfer of Care Note  Patient: Kristen Mcpherson  Procedure(s) Performed: TOTAL LAPAROSCOPIC HYSTERECTOMY WITH SALPINGECTOMY (Bilateral) CYSTOSCOPY CYSTOSCOPY WITH RETROGRADE PYELOGRAM CYSTOSCOPY WITH RETROGRADE PYELOGRAM/URETERAL STENT PLACEMENT (Right)  Patient Location: PACU  Anesthesia Type:General  Level of Consciousness: awake, alert  and oriented  Airway & Oxygen Therapy: Patient Spontanous Breathing  Post-op Assessment: Report given to RN and Post -op Vital signs reviewed and stable  Post vital signs: Reviewed  Last Vitals:  Vitals Value Taken Time  BP 103/63 07/19/20 1049  Temp    Pulse 67 07/19/20 1055  Resp 17 07/19/20 1055  SpO2 99 % 07/19/20 1055  Vitals shown include unvalidated device data.  Last Pain:  Vitals:   07/19/20 0709  TempSrc: Oral  PainSc: 0-No pain         Complications: No notable events documented.

## 2020-07-19 NOTE — Progress Notes (Signed)
Assumed care of patient. Patient awake, alert  states pain "cramping"  come's in waves. Discussed with patient and husband to admit overnight for pain control. Dr. Tiburcio Pea made aware , awaiting orders/decision.  Patient repositioned on left side, calmer, appears more comfortable at this time.  Will continue to monitor.

## 2020-07-19 NOTE — Progress Notes (Signed)
Dr. Tiburcio Pea spoke to patient at bedside, will admit overnight for pain control.  Family updated

## 2020-07-19 NOTE — Anesthesia Procedure Notes (Signed)
Procedure Name: Intubation Date/Time: 07/19/2020 7:37 AM Performed by: Louann Sjogren, CRNA Pre-anesthesia Checklist: Patient identified, Patient being monitored, Timeout performed, Emergency Drugs available and Suction available Patient Re-evaluated:Patient Re-evaluated prior to induction Oxygen Delivery Method: Circle system utilized Preoxygenation: Pre-oxygenation with 100% oxygen Induction Type: IV induction Ventilation: Mask ventilation without difficulty Laryngoscope Size: Mac and 4 Grade View: Grade I Tube type: Oral Tube size: 6.5 mm Number of attempts: 1 Airway Equipment and Method: Stylet Placement Confirmation: ETT inserted through vocal cords under direct vision, positive ETCO2 and breath sounds checked- equal and bilateral Secured at: 21 cm Tube secured with: Tape Dental Injury: Teeth and Oropharynx as per pre-operative assessment

## 2020-07-19 NOTE — Progress Notes (Signed)
Day of Surgery Procedure(s) (LRB): TOTAL LAPAROSCOPIC HYSTERECTOMY WITH SALPINGECTOMY (Bilateral) CYSTOSCOPY CYSTOSCOPY WITH RETROGRADE PYELOGRAM CYSTOSCOPY WITH RETROGRADE PYELOGRAM/URETERAL STENT PLACEMENT (Right)  Subjective: Patient reports  headache, also pain R>L lower abdomen .    Objective: I have reviewed patient's vital signs, intake and output, medications, and labs.  Abd: Min T, ND Incision: clean, dry, and intact Extr: no calf T, no edema  Assessment: s/p Procedure(s): TOTAL LAPAROSCOPIC HYSTERECTOMY WITH SALPINGECTOMY (Bilateral) CYSTOSCOPY CYSTOSCOPY WITH RETROGRADE PYELOGRAM CYSTOSCOPY WITH RETROGRADE PYELOGRAM/URETERAL STENT PLACEMENT (Right): stable  Plan: Advance diet Encourage ambulation Advance to PO medication Monitor overnight for pain management   LOS: 0 days    Letitia Libra 07/19/2020, 3:07 PM

## 2020-07-20 ENCOUNTER — Encounter: Payer: Self-pay | Admitting: Obstetrics & Gynecology

## 2020-07-20 ENCOUNTER — Other Ambulatory Visit: Payer: Self-pay | Admitting: Obstetrics & Gynecology

## 2020-07-20 ENCOUNTER — Telehealth: Payer: Self-pay | Admitting: Obstetrics & Gynecology

## 2020-07-20 DIAGNOSIS — J45909 Unspecified asthma, uncomplicated: Secondary | ICD-10-CM | POA: Diagnosis not present

## 2020-07-20 DIAGNOSIS — N1330 Unspecified hydronephrosis: Secondary | ICD-10-CM | POA: Diagnosis not present

## 2020-07-20 DIAGNOSIS — N9419 Other specified dyspareunia: Secondary | ICD-10-CM | POA: Diagnosis not present

## 2020-07-20 DIAGNOSIS — N800 Endometriosis of uterus: Secondary | ICD-10-CM | POA: Diagnosis not present

## 2020-07-20 DIAGNOSIS — N72 Inflammatory disease of cervix uteri: Secondary | ICD-10-CM | POA: Diagnosis not present

## 2020-07-20 DIAGNOSIS — Z90710 Acquired absence of both cervix and uterus: Secondary | ICD-10-CM | POA: Diagnosis present

## 2020-07-20 DIAGNOSIS — Z20822 Contact with and (suspected) exposure to covid-19: Secondary | ICD-10-CM | POA: Diagnosis not present

## 2020-07-20 DIAGNOSIS — N838 Other noninflammatory disorders of ovary, fallopian tube and broad ligament: Secondary | ICD-10-CM | POA: Diagnosis not present

## 2020-07-20 DIAGNOSIS — K660 Peritoneal adhesions (postprocedural) (postinfection): Secondary | ICD-10-CM | POA: Diagnosis not present

## 2020-07-20 DIAGNOSIS — F17210 Nicotine dependence, cigarettes, uncomplicated: Secondary | ICD-10-CM | POA: Diagnosis not present

## 2020-07-20 LAB — SURGICAL PATHOLOGY

## 2020-07-20 MED ORDER — FESOTERODINE FUMARATE ER 4 MG PO TB24: 4.0000 mg | ORAL_TABLET | Freq: Every day | ORAL | 3 refills | Status: DC

## 2020-07-20 NOTE — Discharge Summary (Signed)
Gynecology Physician Postoperative Discharge Summary  Patient ID: Kristen Mcpherson MRN: [id number] DOB/AGE: 1980/10/02 39 y.o.  Admit Date: 07/19/2020 Discharge Date: 07/20/2020  Preoperative Diagnoses: Pain and bleeding  Procedures: Procedure(s) (LRB): TOTAL LAPAROSCOPIC HYSTERECTOMY WITH SALPINGECTOMY (Bilateral) CYSTOSCOPY CYSTOSCOPY WITH RETROGRADE PYELOGRAM CYSTOSCOPY WITH RETROGRADE PYELOGRAM/URETERAL STENT PLACEMENT (Right)  Significant Labs: CBC Latest Ref Rng & Units 07/18/2020 12/29/2019 12/28/2019  WBC 4.0 - 10.5 K/uL 5.7 6.9 10.4  Hemoglobin 12.0 - 15.0 g/dL 53.6 11.2(L) 11.7(L)  Hematocrit 36.0 - 46.0 % 36.7 33.3(L) 35.7(L)  Platelets 150 - 400 K/uL 253 285 294    Hospital Course:  Kristen Mcpherson is a 40 y.o. [id number]  admitted for scheduled surgery.  She underwent the procedures as mentioned above, her operation was uncomplicated. For further details about surgery, please refer to the operative report. Patient had an uncomplicated postoperative course. By time of discharge on POD#1, her pain was controlled on oral pain medications; she was ambulating, voiding without difficulty, tolerating regular diet and passing flatus. She was deemed stable for discharge to home.   Discharge Exam: Blood pressure 103/62, pulse 76, temperature 98.3 F (36.8 C), temperature source Oral, resp. rate 20, height 5\' 7"  (1.702 m), weight 84.9 kg, SpO2 100 %. General appearance: alert and no distress  Resp: clear to auscultation bilaterally  Cardio: regular rate and rhythm  GI: soft, non-tender; bowel sounds normal; no masses, no organomegaly.  Incision: C/D/I, no erythema, no drainage noted Pelvic: scant blood on pad  Extremities: extremities normal, atraumatic, no cyanosis or edema and Homans sign is negative, no sign of DVT  Discharged Condition: Stable  Disposition: Discharge disposition: 01-Home or Self Care       Discharge Instructions     Call MD for:   persistant nausea and vomiting   Complete by: As directed    Call MD for:  persistant nausea and vomiting   Complete by: As directed    Call MD for:  redness, tenderness, or signs of infection (pain, swelling, redness, odor or green/yellow discharge around incision site)   Complete by: As directed    Call MD for:  redness, tenderness, or signs of infection (pain, swelling, redness, odor or green/yellow discharge around incision site)   Complete by: As directed    Call MD for:  severe uncontrolled pain   Complete by: As directed    Call MD for:  severe uncontrolled pain   Complete by: As directed    Call MD for:  temperature >100.4   Complete by: As directed    Call MD for:  temperature >100.4   Complete by: As directed    Diet - low sodium heart healthy   Complete by: As directed    Discharge instructions   Complete by: As directed    Resume activities according to discharge instruction sheets   Discharge instructions   Complete by: As directed    Resume activities according to discharge instruction sheets   Increase activity slowly   Complete by: As directed    Increase activity slowly   Complete by: As directed       Allergies as of 07/20/2020   No Known Allergies      Medication List     STOP taking these medications    levonorgestrel 20 MCG/24HR IUD Commonly known as: MIRENA       TAKE these medications    Diethylpropion HCl CR 75 MG Tb24 Take 1 tablet (75 mg total) by mouth daily before breakfast.  mirabegron ER 25 MG Tb24 tablet Commonly known as: MYRBETRIQ Take 1 tablet (25 mg total) by mouth daily.   oxyCODONE-acetaminophen 5-325 MG tablet Commonly known as: PERCOCET/ROXICET Take 1 tablet by mouth every 4 (four) hours as needed for moderate pain.   tamsulosin 0.4 MG Caps capsule Commonly known as: FLOMAX Take 1 capsule (0.4 mg total) by mouth daily.   topiramate 25 MG tablet Commonly known as: Topamax Take 1 tablet (25 mg total) by mouth 2  (two) times daily.        Follow-up Information     Kristen Mustard, MD. Go on 07/26/2020.   Specialty: Obstetrics and Gynecology Why: @11 :20 Contact information: [address] [place] [id number]         [phone], MD. Go in 6 week(s).   Specialty: Urology Why: For check up regarding stent and kidney Contact information: [address] [place] [id number]                 [phone], MD

## 2020-07-20 NOTE — Telephone Encounter (Signed)
Let her know Rx changed to Oxybutynin

## 2020-07-20 NOTE — Telephone Encounter (Signed)
Patient called stating that Rx was still not covered and would like you to call in another medication to [facility] in [place].

## 2020-07-20 NOTE — Progress Notes (Signed)
Patient discharged home with husband.  Discharge instructions, when to follow up,  and prescriptions reviewed with patient & husband.  Patient &husband verbalized understanding. Patient will be escorted out by auxiliary.

## 2020-07-21 ENCOUNTER — Other Ambulatory Visit: Payer: Self-pay | Admitting: Obstetrics & Gynecology

## 2020-07-21 MED ORDER — OXYBUTYNIN CHLORIDE ER 10 MG PO TB24: 10.0000 mg | ORAL_TABLET | Freq: Every day | ORAL | 6 refills | Status: DC

## 2020-07-21 NOTE — Telephone Encounter (Signed)
Do you know of a different medication that can be sent in? If not I can call.

## 2020-07-22 ENCOUNTER — Encounter: Payer: Self-pay | Admitting: Obstetrics & Gynecology

## 2020-07-26 ENCOUNTER — Ambulatory Visit (INDEPENDENT_AMBULATORY_CARE_PROVIDER_SITE_OTHER): Payer: Medicaid Other | Admitting: Obstetrics & Gynecology

## 2020-07-26 ENCOUNTER — Encounter: Payer: Self-pay | Admitting: Obstetrics & Gynecology

## 2020-07-26 VITALS — BP 120/70 | Ht 66.0 in | Wt 189.0 lb

## 2020-07-26 DIAGNOSIS — Z90710 Acquired absence of both cervix and uterus: Secondary | ICD-10-CM

## 2020-07-26 DIAGNOSIS — R102 Pelvic and perineal pain: Secondary | ICD-10-CM

## 2020-07-26 DIAGNOSIS — N131 Hydronephrosis with ureteral stricture, not elsewhere classified: Secondary | ICD-10-CM

## 2020-07-26 MED ORDER — HYDROCODONE-ACETAMINOPHEN 10-325 MG PO TABS: 1.0000 | ORAL_TABLET | ORAL | 0 refills | Status: DC | PRN

## 2020-07-26 NOTE — Progress Notes (Signed)
  Postoperative Follow-up Patient presents post op from  [facility]  for  Pain, bleeding, and CIN3 , 1 week ago. Path: DIAGNOSIS:  A. UTERUS AND BILATERAL FALLOPIAN TUBES; HYSTERECTOMY AND BILATERAL  SALPINGECTOMY:  - BENIGN ECTOCERVICAL MUCOSA WITH CHRONIC INFLAMMATION AND  PARAKERATOSIS.  - LARGELY DENUDED ENDOCERVICAL SURFACE WITH FEW BENIGN ENDOCERVICAL  GLANDS AND CHRONIC ENDOCERVICITIS.  - BENIGN PROLIFERATIVE ENDOMETRIUM AND BENIGN MYOMETRIUM WITH FOCAL  DEFECT.  - SUPERFICIAL ADENOMYOSIS.  - BILATERAL BENIGN FALLOPIAN TUBES.  - BENIGN PARATUBAL CYST. Subjective: Patient reports little improvement in her symptoms of pain since the operation. Eating a regular diet  with low appetite, no n/v/d.   Pain is not well controlled.  Medications being used: narcotic analgesics including Percocet.  Activity: sedentary. Patient reports additional symptom's since surgery of Frequency and urgency for the 7 days since surgery.  Objective: BP 120/70   Ht 5\' 6"  (1.676 m)   Wt 189 lb (85.7 kg)   BMI 30.51 kg/m  Physical Exam Constitutional:      General: She is not in acute distress.    Appearance: She is well-developed.  Cardiovascular:     Rate and Rhythm: Normal rate.  Pulmonary:     Effort: Pulmonary effort is normal.  Abdominal:     General: Bowel sounds are normal. There is no distension.     Palpations: Abdomen is soft.     Tenderness: There is generalized abdominal tenderness. There is no guarding or rebound. Negative signs include Murphy's sign.     Comments: Incision Healing Well Mild generalized T  Musculoskeletal:        General: Normal range of motion.  Neurological:     Mental Status: She is alert and oriented to person, place, and time.     Cranial Nerves: No cranial nerve deficit.  Skin:    General: Skin is warm and dry.    Assessment: s/p :  total laparoscopic hysterectomy with bilateral salpingectomy stable She still has pain, and with stent and right  hydronephrosis and dilated ureter (short or long term unknown) not sure of which is etiology D/W urology and CT as next option to look for compression as well as degree of kidney/ureter effect  Plan: he patient understands what complications to be concerned about.  I will see the patient in routine follow up, or sooner if needed.    Activity plan: No heavy lifting.  Pelvic rest.  Marland Kitchen 07/26/2020, 11:49 AM

## 2020-07-27 ENCOUNTER — Other Ambulatory Visit: Payer: Self-pay | Admitting: Obstetrics & Gynecology

## 2020-07-27 DIAGNOSIS — R1084 Generalized abdominal pain: Secondary | ICD-10-CM

## 2020-08-10 ENCOUNTER — Ambulatory Visit
Admission: RE | Admit: 2020-08-10 | Discharge: 2020-08-10 | Disposition: A | Discharge status: Home / Self Care | Payer: Medicaid Other | Source: Ambulatory Visit | Attending: Obstetrics & Gynecology | Admitting: Obstetrics & Gynecology

## 2020-08-10 ENCOUNTER — Other Ambulatory Visit: Payer: Self-pay

## 2020-08-10 DIAGNOSIS — N131 Hydronephrosis with ureteral stricture, not elsewhere classified: Secondary | ICD-10-CM | POA: Diagnosis not present

## 2020-08-10 DIAGNOSIS — R102 Pelvic and perineal pain: Secondary | ICD-10-CM | POA: Insufficient documentation

## 2020-08-10 DIAGNOSIS — R1031 Right lower quadrant pain: Secondary | ICD-10-CM | POA: Diagnosis not present

## 2020-08-10 MED ORDER — IOHEXOL 350 MG/ML SOLN
100.0000 mL | Freq: Once | INTRAVENOUS | Status: AC | PRN
  Administered 2020-08-10: 100 mL via INTRAVENOUS

## 2020-08-11 ENCOUNTER — Other Ambulatory Visit: Payer: Self-pay | Admitting: Obstetrics & Gynecology

## 2020-08-11 MED ORDER — HYDROCODONE-ACETAMINOPHEN 10-325 MG PO TABS: 1.0000 | ORAL_TABLET | ORAL | 0 refills | Status: DC | PRN

## 2020-08-11 NOTE — Progress Notes (Signed)
CT discussed w pt, also w Dr Lonna Cobb Stent may have migrated, he will look into making an adjustment Also the hydronephrosis is unclear as to acute or chronic, he feels it may have chronm\ic nature to it, so this will be looked in to  Pt reports pain is present, right sided, 6-8/10 most days  I counseled that we will continue to follow up closely on the next steps, such as stent adjustment or re-do; future kidney assessment; further scans as indicated.  This seems to be a kidney related issue as source more so than routine post op pain.  Annamarie Major, MD, [facility], [facility] 08/11/2020  4:46 PM

## 2020-08-12 ENCOUNTER — Other Ambulatory Visit: Payer: Self-pay

## 2020-08-12 DIAGNOSIS — N1330 Unspecified hydronephrosis: Secondary | ICD-10-CM

## 2020-08-15 ENCOUNTER — Encounter: Payer: Self-pay | Admitting: Oncology

## 2020-08-15 MED ORDER — GENTAMICIN SULFATE 40 MG/ML IJ SOLN
5.0000 mg/kg | INTRAVENOUS | Status: AC
  Administered 2020-08-16: 430 mg via INTRAVENOUS
  Filled 2020-08-15: qty 10.75

## 2020-08-16 ENCOUNTER — Encounter
Admission: RE | Disposition: A | Discharge status: Home / Self Care | Payer: Self-pay | Source: Home / Self Care | Attending: Urology

## 2020-08-16 ENCOUNTER — Other Ambulatory Visit: Payer: Self-pay

## 2020-08-16 ENCOUNTER — Ambulatory Visit
Admission: RE | Admit: 2020-08-16 | Discharge: 2020-08-16 | Disposition: A | Discharge status: Home / Self Care | Payer: Medicaid Other | Attending: Urology | Admitting: Urology

## 2020-08-16 ENCOUNTER — Ambulatory Visit: Payer: Medicaid Other

## 2020-08-16 ENCOUNTER — Ambulatory Visit: Payer: Medicaid Other | Admitting: Anesthesiology

## 2020-08-16 ENCOUNTER — Encounter: Payer: Self-pay | Admitting: Urology

## 2020-08-16 DIAGNOSIS — Z90710 Acquired absence of both cervix and uterus: Secondary | ICD-10-CM | POA: Insufficient documentation

## 2020-08-16 DIAGNOSIS — Z79899 Other long term (current) drug therapy: Secondary | ICD-10-CM | POA: Diagnosis not present

## 2020-08-16 DIAGNOSIS — N2889 Other specified disorders of kidney and ureter: Secondary | ICD-10-CM | POA: Diagnosis not present

## 2020-08-16 DIAGNOSIS — Z9079 Acquired absence of other genital organ(s): Secondary | ICD-10-CM | POA: Insufficient documentation

## 2020-08-16 DIAGNOSIS — Z87891 Personal history of nicotine dependence: Secondary | ICD-10-CM | POA: Insufficient documentation

## 2020-08-16 DIAGNOSIS — R109 Unspecified abdominal pain: Secondary | ICD-10-CM | POA: Diagnosis not present

## 2020-08-16 DIAGNOSIS — Z466 Encounter for fitting and adjustment of urinary device: Secondary | ICD-10-CM | POA: Insufficient documentation

## 2020-08-16 DIAGNOSIS — Z9889 Other specified postprocedural states: Secondary | ICD-10-CM | POA: Diagnosis not present

## 2020-08-16 DIAGNOSIS — N1330 Unspecified hydronephrosis: Secondary | ICD-10-CM | POA: Diagnosis not present

## 2020-08-16 DIAGNOSIS — T83192A Other mechanical complication of urinary stent, initial encounter: Secondary | ICD-10-CM | POA: Diagnosis not present

## 2020-08-16 HISTORY — PX: CYSTOSCOPY WITH URETEROSCOPY AND STENT PLACEMENT: SHX6377

## 2020-08-16 SURGERY — CYSTOURETEROSCOPY, WITH STENT INSERTION
Anesthesia: General | Laterality: Right

## 2020-08-16 MED ORDER — FENTANYL CITRATE (PF) 100 MCG/2ML IJ SOLN
INTRAMUSCULAR | Status: DC | PRN
  Administered 2020-08-16: 25 ug via INTRAVENOUS

## 2020-08-16 MED ORDER — PROPOFOL 10 MG/ML IV BOLUS
INTRAVENOUS | Status: DC | PRN
  Administered 2020-08-16: 160 mg via INTRAVENOUS

## 2020-08-16 MED ORDER — LACTATED RINGERS IV SOLN: INTRAVENOUS | Status: DC

## 2020-08-16 MED ORDER — ONDANSETRON HCL 4 MG/2ML IJ SOLN
INTRAMUSCULAR | Status: DC | PRN
  Administered 2020-08-16: 4 mg via INTRAVENOUS

## 2020-08-16 MED ORDER — OXYCODONE HCL 5 MG PO TABS
ORAL_TABLET | ORAL | Status: AC
  Filled 2020-08-16: qty 1

## 2020-08-16 MED ORDER — IOHEXOL 180 MG/ML  SOLN
INTRAMUSCULAR | Status: DC | PRN
  Administered 2020-08-16: 10 mL via ORAL

## 2020-08-16 MED ORDER — MIDAZOLAM HCL 2 MG/2ML IJ SOLN
INTRAMUSCULAR | Status: DC | PRN
  Administered 2020-08-16: 2 mg via INTRAVENOUS

## 2020-08-16 MED ORDER — ORAL CARE MOUTH RINSE: 15.0000 mL | Freq: Once | OROMUCOSAL | Status: AC

## 2020-08-16 MED ORDER — LIDOCAINE HCL (CARDIAC) PF 100 MG/5ML IV SOSY
PREFILLED_SYRINGE | INTRAVENOUS | Status: DC | PRN
  Administered 2020-08-16: 100 mg via INTRAVENOUS

## 2020-08-16 MED ORDER — ACETAMINOPHEN 10 MG/ML IV SOLN
INTRAVENOUS | Status: AC
  Filled 2020-08-16: qty 100

## 2020-08-16 MED ORDER — OXYCODONE HCL 5 MG PO TABS
5.0000 mg | ORAL_TABLET | Freq: Once | ORAL | Status: AC
  Administered 2020-08-16: 5 mg via ORAL

## 2020-08-16 MED ORDER — FENTANYL CITRATE (PF) 100 MCG/2ML IJ SOLN
25.0000 ug | INTRAMUSCULAR | Status: DC | PRN
  Administered 2020-08-16: 50 ug via INTRAVENOUS

## 2020-08-16 MED ORDER — CHLORHEXIDINE GLUCONATE 0.12 % MT SOLN
OROMUCOSAL | Status: AC
  Administered 2020-08-16: 15 mL via OROMUCOSAL
  Filled 2020-08-16: qty 15

## 2020-08-16 MED ORDER — ACETAMINOPHEN 10 MG/ML IV SOLN
INTRAVENOUS | Status: DC | PRN
  Administered 2020-08-16: 1000 mg via INTRAVENOUS

## 2020-08-16 MED ORDER — MIDAZOLAM HCL 2 MG/2ML IJ SOLN
INTRAMUSCULAR | Status: AC
  Filled 2020-08-16: qty 2

## 2020-08-16 MED ORDER — MEPERIDINE HCL 25 MG/ML IJ SOLN: 6.2500 mg | INTRAMUSCULAR | Status: DC | PRN

## 2020-08-16 MED ORDER — FENTANYL CITRATE (PF) 100 MCG/2ML IJ SOLN
INTRAMUSCULAR | Status: AC
  Filled 2020-08-16: qty 2

## 2020-08-16 MED ORDER — ONDANSETRON HCL 4 MG/2ML IJ SOLN: 4.0000 mg | Freq: Once | INTRAMUSCULAR | Status: DC | PRN

## 2020-08-16 MED ORDER — FENTANYL CITRATE (PF) 100 MCG/2ML IJ SOLN
INTRAMUSCULAR | Status: AC
  Administered 2020-08-16: 50 ug via INTRAVENOUS
  Filled 2020-08-16: qty 2

## 2020-08-16 MED ORDER — CHLORHEXIDINE GLUCONATE 0.12 % MT SOLN: 15.0000 mL | Freq: Once | OROMUCOSAL | Status: AC

## 2020-08-16 MED ORDER — DEXAMETHASONE SODIUM PHOSPHATE 10 MG/ML IJ SOLN
INTRAMUSCULAR | Status: DC | PRN
  Administered 2020-08-16: 8 mg via INTRAVENOUS

## 2020-08-16 SURGICAL SUPPLY — 31 items
BAG DRAIN CYSTO-URO LG1000N (MISCELLANEOUS) ×2 IMPLANT
BASKET ZERO TIP 1.9FR (BASKET) IMPLANT
BRUSH SCRUB EZ 1% IODOPHOR (MISCELLANEOUS) ×2 IMPLANT
BSKT STON RTRVL ZERO TP 1.9FR (BASKET)
CATH URET FLEX-TIP 2 LUMEN 10F (CATHETERS) IMPLANT
CATH URETL OPEN 5X70 (CATHETERS) ×2 IMPLANT
CNTNR SPEC 2.5X3XGRAD LEK (MISCELLANEOUS)
CONT SPEC 4OZ STER OR WHT (MISCELLANEOUS)
CONT SPEC 4OZ STRL OR WHT (MISCELLANEOUS)
CONTAINER SPEC 2.5X3XGRAD LEK (MISCELLANEOUS) IMPLANT
DRAPE UTILITY 15X26 TOWEL STRL (DRAPES) ×2 IMPLANT
GAUZE 4X4 16PLY ~~LOC~~+RFID DBL (SPONGE) ×4 IMPLANT
GLOVE SURG UNDER POLY LF SZ7.5 (GLOVE) ×2 IMPLANT
GOWN STRL REUS W/ TWL LRG LVL3 (GOWN DISPOSABLE) ×1 IMPLANT
GOWN STRL REUS W/ TWL XL LVL3 (GOWN DISPOSABLE) ×1 IMPLANT
GOWN STRL REUS W/TWL LRG LVL3 (GOWN DISPOSABLE) ×2
GOWN STRL REUS W/TWL XL LVL3 (GOWN DISPOSABLE) ×2
GUIDEWIRE STR DUAL SENSOR (WIRE) ×2 IMPLANT
INFUSOR MANOMETER BAG 3000ML (MISCELLANEOUS) IMPLANT
IV NS IRRIG 3000ML ARTHROMATIC (IV SOLUTION) ×2 IMPLANT
KIT TURNOVER CYSTO (KITS) ×2 IMPLANT
PACK CYSTO AR (MISCELLANEOUS) ×2 IMPLANT
SET CYSTO W/LG BORE CLAMP LF (SET/KITS/TRAYS/PACK) ×2 IMPLANT
SHEATH URETERAL 12FRX35CM (MISCELLANEOUS) IMPLANT
STENT URET 6FRX24 CONTOUR (STENTS) IMPLANT
STENT URET 6FRX26 CONTOUR (STENTS) IMPLANT
STENT URO INLAY 6FRX22CM (STENTS) ×2 IMPLANT
SURGILUBE 2OZ TUBE FLIPTOP (MISCELLANEOUS) ×2 IMPLANT
TRACTIP FLEXIVA PULSE ID 200 (Laser) IMPLANT
VALVE UROSEAL ADJ ENDO (VALVE) IMPLANT
WATER STERILE IRR 1000ML POUR (IV SOLUTION) ×2 IMPLANT

## 2020-08-16 NOTE — Anesthesia Procedure Notes (Signed)
Procedure Name: LMA Insertion Date/Time: 08/16/2020 3:05 PM Performed by: Henrietta Hoover, CRNA Pre-anesthesia Checklist: Patient identified, Emergency Drugs available, Suction available and Patient being monitored Patient Re-evaluated:Patient Re-evaluated prior to induction Oxygen Delivery Method: Circle system utilized Preoxygenation: Pre-oxygenation with 100% oxygen Induction Type: IV induction Ventilation: Mask ventilation without difficulty LMA: LMA inserted LMA Size: 4.5 Number of attempts: 1 Placement Confirmation: positive ETCO2 and breath sounds checked- equal and bilateral Tube secured with: Tape Dental Injury: Teeth and Oropharynx as per pre-operative assessment

## 2020-08-16 NOTE — Interval H&P Note (Signed)
History and Physical Interval Note:  08/16/2020 2:55 PM  Kristen Mcpherson  has presented today for surgery, with the diagnosis of right hydronephrosis.  The various methods of treatment have been discussed with the patient and family. After consideration of risks, benefits and other options for treatment, the patient has consented to  Procedure(s): CYSTOSCOPY WITH URETEROSCOPY AND STENT EXCHANGE (Right) as a surgical intervention.  The patient's history has been reviewed, patient examined, no change in status, stable for surgery.  I have reviewed the patient's chart and labs.  Questions were answered to the patient's satisfaction.     Anelle Parlow C Adonna Horsley

## 2020-08-16 NOTE — Anesthesia Preprocedure Evaluation (Signed)
Anesthesia Evaluation  Patient identified by MRN, date of birth, ID band Patient awake    Reviewed: Allergy & Precautions, NPO status , Patient's Chart, lab work & pertinent test results  History of Anesthesia Complications Negative for: history of anesthetic complications  Airway Mallampati: I  TM Distance: >3 FB Neck ROM: Full    Dental no notable dental hx. (+) Teeth Intact   Pulmonary asthma , neg sleep apnea, neg COPD, Patient abstained from smoking.Not current smoker, former smoker,  No inhaler use in a year. Patient hospitalized as a child for asthma but not as an adult   Pulmonary exam normal breath sounds clear to auscultation       Cardiovascular Exercise Tolerance: Good METS(-) hypertension(-) CAD and (-) Past MI negative cardio ROS  (-) dysrhythmias  Rhythm:Regular Rate:Normal - Systolic murmurs    Neuro/Psych PSYCHIATRIC DISORDERS Anxiety Depression negative neurological ROS     GI/Hepatic neg GERD  ,(+)     (-) substance abuse  ,   Endo/Other  neg diabetes  Renal/GU negative Renal ROS     Musculoskeletal   Abdominal   Peds  Hematology   Anesthesia Other Findings Past Medical History: 08/29/2017: AMA (advanced maternal age) multigravida 37+, second  trimester No date: Anemia No date: Anxiety No date: Asthma 03/20/2018: Breech presentation delivered 04/03/2018: Cesarean delivery indicated due to breech presentation No date: Family history of breast cancer     Comment:  4/22 cancer genetic testing letter sent No date: Family history of ovarian cancer 08/29/2017: High-risk pregnancy, third trimester     Comment:  [facility] Prenatal Labs Dating [place] Blood type:               A/Positive/-- (07/19 0000) A + Genetic Screen NIPS:               Normal 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 Antibody:  Anatomic [place]  complete Rubella:               Immune (07/19  0000)  Varicella: Immune GTT Early:               WNLThird trimester:  RPR: Nonreactive (07/19 0000)                Rhogam  not needed HBsAg: Negative (07/19 0000)  TDaP               vaccine                   01/08/2018: Iron deficiency anemia  Reproductive/Obstetrics                             Anesthesia Physical  Anesthesia Plan  ASA: 2  Anesthesia Plan: General   Post-op Pain Management:    Induction: Intravenous  PONV Risk Score and Plan: 4 or greater and Ondansetron, Dexamethasone, Propofol infusion and Midazolam  Airway Management Planned: Oral ETT and LMA  Additional Equipment: None  Intra-op Plan:   Post-operative Plan: Extubation in OR  Informed Consent: I have reviewed the patients History and Physical, chart, labs and discussed the procedure including the risks, benefits and alternatives for the proposed anesthesia with the patient or authorized representative who has indicated his/her understanding and acceptance.     Dental advisory given  Plan Discussed with: CRNA and Surgeon  Anesthesia  Plan Comments: (Discussed risks of anesthesia with patient, including PONV, sore throat, lip/dental damage. Rare risks discussed as well, such as cardiorespiratory and neurological sequelae. Patient understands.)       Anesthesia Quick Evaluation

## 2020-08-16 NOTE — H&P (Signed)
Urology H&P   History of Present Illness: Rosamund Nyland is a 40 y.o. status post total laparoscopic hysterectomy with salpingectomy 07/19/2020 and post procedure cystoscopy showed no efflux from the right ureteral orifice.  Intraoperative right retrograde pyelogram showed moderate to severe right hydronephrosis/hydroureter with tapering at the midportion of the right distal ureter.  A ureteral stent was placed.  Since stent placement she has had fairly significant right flank and abdominal pain most likely secondary to stent symptoms.  Dr. Tiburcio Pea obtained a CT abdomen pelvis last week which shows fairly significant hydronephrosis with some parenchymal atrophy.  The proximal curl of the stent is within the proximal ureter however this may be secondary to marked dilation of the proximal ureter.  She presents today for cystoscopy and repositioning of the stent to see if this helps her symptoms.    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 Antibody:  Anatomic [place]  complete Rubella: Immune (07/19 0000)  Varicella: Immune GTT Early: WNLThird trimester:  RPR: Nonreactive (07/19 0000)  Rhogam  not needed HBsAg: Negative (07/19 0000)  TDaP vaccine                     Iron deficiency anemia 01/08/2018    Past Surgical History:  Procedure Laterality Date   APPENDECTOMY     CERVICAL CONIZATION W/BX N/A 06/12/2018   Procedure: CONIZATION CERVIX WITH  BIOPSY - COLD KNIFE;  Surgeon: Nadara Mustard, MD;  Location: [facility];  Service: Gynecology;  Laterality: N/A;   CESAREAN SECTION N/A 03/20/2018   Procedure: CESAREAN SECTION;  Surgeon: Nadara Mustard, MD;  Location: [facility] OR[facility];  Service: Obstetrics;  Laterality: N/A;   CYSTOSCOPY  07/19/2020   Procedure: CYSTOSCOPY;  Surgeon: Nadara Mustard, MD;  Location: [facility] OR[facility];  Service: Gynecology;;   Bluford Kaufmann W/ RETROGRADES  07/19/2020   Procedure: CYSTOSCOPY WITH RETROGRADE PYELOGRAM;  Surgeon: Riki Altes, MD;  Location: [facility] OR[facility];  Service: Urology;;   CYSTOSCOPY W/ URETERAL STENT PLACEMENT Right 07/19/2020   Procedure: CYSTOSCOPY WITH RETROGRADE PYELOGRAM/URETERAL STENT PLACEMENT;  Surgeon: Riki Altes, MD;  Location: [facility] OR[facility];  Service: Urology;  Laterality: Right;   TOTAL LAPAROSCOPIC HYSTERECTOMY WITH SALPINGECTOMY Bilateral 07/19/2020   Procedure: TOTAL LAPAROSCOPIC HYSTERECTOMY WITH SALPINGECTOMY;  Surgeon: Nadara Mustard, MD;  Location: [facility];  Service: Gynecology;  Laterality: Bilateral;   TUBAL LIGATION Bilateral 03/20/2018   Procedure: BILATERAL TUBAL LIGATION;  Surgeon: Nadara Mustard, MD;  Location: [facility] OR[facility];  Service: Obstetrics;  Laterality: Bilateral;    Home Medications:  Current Meds  Medication Sig   acetaminophen (TYLENOL) 500 MG tablet Take 500-1,000 mg by mouth every 6 (six) hours as needed (for pain.).   Diethylpropion HCl CR 75 MG TB24 Take 1 tablet (75 mg total) by mouth daily before breakfast.   HYDROcodone-acetaminophen (NORCO) 10-325 MG tablet Take 1 tablet by mouth every 4 (four) hours as needed.  tamsulosin (FLOMAX) 0.4 MG CAPS capsule Take 1 capsule (0.4 mg total) by mouth daily.   topiramate (TOPAMAX) 25 MG tablet Take 1 tablet (25 mg total) by mouth 2 (two) times daily.   TOVIAZ 4 MG TB24 tablet Take 4 mg by mouth in the morning.    Allergies: No Known Allergies  Family History  Problem Relation Age of Onset   Diabetes Mother    Arthritis  Mother    Depression Mother    Anxiety disorder Mother    Anemia Mother    Stroke Mother    Restless legs syndrome Mother    Hyperlipidemia Father    Depression Father    Hypertension Father    ADD / ADHD Brother    Diabetes Maternal Grandmother    Liver disease Maternal Grandmother    Breast cancer Maternal Grandmother    Rheum arthritis Maternal Grandfather    Dementia Maternal Grandfather    Breast cancer Paternal Grandmother    Ovarian cancer Paternal Aunt     Social History:  reports that she quit smoking about 5 months ago. Her smoking use included cigarettes. She started smoking about 20 years ago. She smoked an average of .5 packs per day. She has never used smokeless tobacco. She reports current alcohol use. She reports that she does not use drugs.  ROS: A complete review of systems was performed.  All systems are negative except for pertinent findings as noted.  Physical Exam:  Vital signs in last 24 hours: Temp:  [98 F (36.7 C)] 98 F (36.7 C) (08/16 1343) Pulse Rate:  [86] 86 (08/16 1343) BP: (100)/(88) 100/88 (08/16 1343) SpO2:  [100 %] 100 % (08/16 1343) Weight:  [83.5 kg] 83.5 kg (08/16 1343) Constitutional:  Alert and oriented, No acute distress HEENT: [place] AT, moist mucus membranes.  Trachea midline, no masses Cardiovascular: Regular rate and rhythm Respiratory: Normal respiratory effort, lungs clear bilaterally  Impression/Plan Severe right hydronephrosis/hydroureter with parenchymal atrophy This is more in appearance with a chronic process and not acute ureteral obstruction We have discussed possibility of kinking of the ureter at the vaginal suture line and had planned on leaving and the stent 6-8 weeks to allow these suture to dissolve with follow-up retrograde pyelogram Cystoscopy with replacement of right ureteral stent.  The procedure was discussed including the possibility of continued pain.  All questions were answered and she desires to  proceed   08/16/2020, 2:50 PM  Irineo Axon,  MD

## 2020-08-16 NOTE — Transfer of Care (Signed)
Immediate Anesthesia Transfer of Care Note  Patient: Kristen Mcpherson  Procedure(s) Performed: CYSTOSCOPY WITH URETEROSCOPY AND STENT EXCHANGE (Right)  Patient Location: PACU  Anesthesia Type:General  Level of Consciousness: awake, drowsy and patient cooperative  Airway & Oxygen Therapy: Patient Spontanous Breathing and Patient connected to face mask oxygen  Post-op Assessment: Report given to RN, Patient moving all extremities and Patient moving all extremities X 4  Post vital signs: Reviewed and stable  Last Vitals:  Vitals Value Taken Time  BP 103/53 08/16/20 1543  Temp 36.1 C 08/16/20 1543  Pulse 85 08/16/20 1545  Resp 12 08/16/20 1545  SpO2 100 % 08/16/20 1545  Vitals shown include unvalidated device data.  Last Pain:  Vitals:   08/16/20 1543  TempSrc:   PainSc: Asleep         Complications: No notable events documented.

## 2020-08-16 NOTE — Anesthesia Postprocedure Evaluation (Signed)
Anesthesia Post Note  Patient: Kristen Mcpherson  Procedure(s) Performed: CYSTOSCOPY WITH URETEROSCOPY AND STENT EXCHANGE (Right)  Patient location during evaluation: PACU Anesthesia Type: General Level of consciousness: awake and alert Pain management: pain level controlled Vital Signs Assessment: post-procedure vital signs reviewed and stable Respiratory status: spontaneous breathing, nonlabored ventilation, respiratory function stable and patient connected to nasal cannula oxygen Cardiovascular status: blood pressure returned to baseline and stable Postop Assessment: no apparent nausea or vomiting Anesthetic complications: no   No notable events documented.   Last Vitals:  Vitals:   08/16/20 1643 08/16/20 1650  BP:  106/60  Pulse:  71  Resp: 14 15  Temp: 36.7 C   SpO2:  100%    Last Pain:  Vitals:   08/16/20 1643  TempSrc: Temporal  PainSc: 4                  Cleda Mccreedy Virdell Hoiland

## 2020-08-16 NOTE — Discharge Instructions (Addendum)
DISCHARGE INSTRUCTIONS FOR KIDNEY STONE/URETERAL STENT   MEDICATIONS:  1. Resume all your other meds from home.  2.  AZO (over-the-counter) can help with the burning/stinging when you urinate.   ACTIVITY:  1. May resume regular activities in 24 hours. 2. No driving while on narcotic pain medications  3. Drink plenty of water  4. Continue to walk at home - you can still get blood clots when you are at home, so keep active, but don't over do it.  5. May return to work/school tomorrow or when you feel ready    SIGNS/SYMPTOMS TO CALL:  Common postoperative symptoms include urinary frequency, urgency, bladder spasm and blood in the urine  Please call us if you have a fever greater than 101.5, uncontrolled nausea/vomiting, uncontrolled pain, dizziness, unable to urinate, excessively bloody urine, chest pain, shortness of breath, leg swelling, leg pain, or any other concerns or questions.   You can reach [place] at [phone].   FOLLOW-UP:  1. You will be contacted by [facility] regarding a follow-up appointment.  AMBULATORY SURGERY  DISCHARGE INSTRUCTIONS   The drugs that you were given will stay in your system until tomorrow so for the next 24 hours you should not:  Drive an automobile Make any legal decisions Drink any alcoholic beverage   You may resume regular meals tomorrow.  Today it is better to start with liquids and gradually work up to solid foods.  You may eat anything you prefer, but it is better to start with liquids, then soup and crackers, and gradually work up to solid foods.   Please notify your doctor immediately if you have any unusual bleeding, trouble breathing, redness and pain at the surgery site, drainage, fever, or pain not relieved by medication.     Your post-operative visit with Dr.                                       is: Date:                        Time:    Please call to schedule your post-operative visit.  Additional  Instructions:

## 2020-08-17 ENCOUNTER — Encounter: Payer: Self-pay | Admitting: Urology

## 2020-08-17 NOTE — Op Note (Signed)
Preoperative diagnosis:  Right hydronephrosis Stent pain  Postoperative diagnosis:  Same  Procedure: Cystoscopy with right ureteral stent exchange  Surgeon: Riki Altes, MD  Anesthesia: General  Complications: None  Intraoperative findings:  1.  Right retrograde pyelogram: Moderate right hydronephrosis/caliectasis  EBL: Minimal  Specimens: None  Indication: Kristen Mcpherson is a 40 y.o. female status post total laparoscopic hysterectomy with salpingectomy 07/19/2020 and post procedure cystoscopy showed no efflux from the right ureteral orifice.  Intraoperative right retrograde pyelogram showed moderate to severe right hydronephrosis/hydroureter with tapering at the midportion of the right distal ureter.  A ureteral stent was placed.  She has had significant stent pain and recent CT performed by Dr. Tiburcio Pea showed a large portion of the stent line across the bladder with distal stent migration of the proximal curl however this may be secondary to chronic hydroureter.  After reviewing the management options for treatment, he elected to proceed with the above surgical procedure(s). We have discussed the potential benefits and risks of the procedure, side effects of the proposed treatment, the likelihood of the patient achieving the goals of the procedure, and any potential problems that might occur during the procedure or recuperation. Informed consent has been obtained.  Description of procedure:  The patient was taken to the operating room and general anesthesia was induced.  The patient was placed in the dorsal lithotomy position, prepped and draped in the usual sterile fashion, and preoperative antibiotics were administered. A preoperative time-out was performed.   A 21 French cystoscope sheath with obturator was lubricated and passed per urethra.  Panendoscopy was then performed and the bladder mucosa showed no erythema, solid or papillary lesions with the exception of mild  inflammatory changes of the right hemitrigone secondary to the indwelling stent.  There was mild stent encrustation noted.  The stent was grasped with endoscopic forceps and brought out to the urethral meatus.  A 0.038 Sensor wire was then placed to the stent and advanced into the renal pelvis.  The ureteral stent was then removed.  A 5 French open-ended ureteral catheter was placed over the guidewire and retrograde pyelogram was performed after guidewire removal with findings as described above.  The guidewire was replaced and a 75F/22 cm Bard Optima stent was placed on fluoroscopic guidance.  There was a good curl noted in the renal pelvis at the UPJ and the distal curl was just distal to the UO.  After anesthetic reversal she was transported to the PACU in stable condition.  Plan: The etiology of her hydronephrosis is unknown and potentially chronic however due to findings on retrograde pyelogram at the time of her hysterectomy the plan is to keep the stent for 6-8 weeks to allow time for the vaginal suture line to dissolve.   Riki Altes, M.D.

## 2020-08-23 ENCOUNTER — Ambulatory Visit (INDEPENDENT_AMBULATORY_CARE_PROVIDER_SITE_OTHER): Payer: Medicaid Other | Admitting: Obstetrics & Gynecology

## 2020-08-23 ENCOUNTER — Other Ambulatory Visit: Payer: Self-pay

## 2020-08-23 ENCOUNTER — Encounter: Payer: Self-pay | Admitting: Obstetrics & Gynecology

## 2020-08-23 VITALS — BP 120/80 | Ht 66.0 in | Wt 188.0 lb

## 2020-08-23 DIAGNOSIS — Z90710 Acquired absence of both cervix and uterus: Secondary | ICD-10-CM

## 2020-08-23 NOTE — Progress Notes (Signed)
  Postoperative Follow-up Patient presents post op from  [facility]  for abnormal uterine bleeding, pelvic pain, and CIN3 , 1 month ago.  Subjective: Patient reports some improvement in her pain symptoms, especially since having her ureteral stent adjusted by Urology last week.  Feels like she has "vag tampon in". Eating a regular diet without difficulty. Pain is controlled with current analgesics. Medications being used: ibuprofen (OTC).  Activity: normal activities of daily living. Patient reports additional symptom's since surgery of No Bleeding.  Objective: BP 120/80   Ht 5\' 6"  (1.676 m)   Wt 188 lb (85.3 kg)   LMP 08/18/2018   BMI 30.34 kg/m  Physical Exam Constitutional:      General: She is not in acute distress.    Appearance: She is well-developed.  Genitourinary:     Rectum normal.     Genitourinary Comments: Cervix and uterus absent. Vaginal cuff healing well.     No vaginal erythema or bleeding.      Right Adnexa: not tender and no mass present.    Left Adnexa: not tender and no mass present.    Cervix is absent.     Uterus is absent.  Cardiovascular:     Rate and Rhythm: Normal rate.  Pulmonary:     Effort: Pulmonary effort is normal.  Abdominal:     General: There is no distension.     Palpations: Abdomen is soft.     Tenderness: There is no abdominal tenderness.     Comments: Incision healing well.  Musculoskeletal:        General: Normal range of motion.  Neurological:     Mental Status: She is alert and oriented to person, place, and time.     Cranial Nerves: No cranial nerve deficit.  Skin:    General: Skin is warm and dry.    Assessment: s/p :  total laparoscopic hysterectomy with bilateral salpingectomy stable  Plan: Patient has done well after surgery with no apparent complications.  I have discussed the post-operative course to date, and the expected progress moving forward.  The patient understands what complications to be concerned about.  I will  see the patient in routine follow up, or sooner if needed.    Activity plan: No restriction except continued  Pelvic rest. Plan stent for another 4 weeks or so Urology to follow Concern is that right hydronephrosis may have been chronic; follow up thru uro or nephro as needed  08/20/2018 08/23/2020, 11:23 AM

## 2020-08-29 ENCOUNTER — Telehealth: Payer: Self-pay

## 2020-08-29 ENCOUNTER — Other Ambulatory Visit: Payer: Self-pay | Admitting: Obstetrics & Gynecology

## 2020-08-29 DIAGNOSIS — E669 Obesity, unspecified: Secondary | ICD-10-CM

## 2020-08-29 NOTE — Telephone Encounter (Signed)
I would wait until follow up and then we can consider restarting the medicine then (await complete healing)

## 2020-08-29 NOTE — Telephone Encounter (Signed)
Pt calling for refill of wt loss med; pharm tried to put in for it but it was denied; pt has appt 09/20/20; states it wasn't discussed when she was last seen but needs refill.  [phone]

## 2020-08-30 NOTE — Telephone Encounter (Signed)
Pt aware.

## 2020-09-17 ENCOUNTER — Other Ambulatory Visit: Payer: Self-pay | Admitting: Obstetrics & Gynecology

## 2020-09-20 ENCOUNTER — Other Ambulatory Visit: Payer: Self-pay

## 2020-09-20 ENCOUNTER — Ambulatory Visit (INDEPENDENT_AMBULATORY_CARE_PROVIDER_SITE_OTHER): Payer: Medicaid Other | Admitting: Obstetrics & Gynecology

## 2020-09-20 ENCOUNTER — Encounter: Payer: Self-pay | Admitting: Obstetrics & Gynecology

## 2020-09-20 ENCOUNTER — Other Ambulatory Visit: Payer: Self-pay | Admitting: Obstetrics & Gynecology

## 2020-09-20 VITALS — BP 120/80 | Ht 66.0 in | Wt 191.0 lb

## 2020-09-20 DIAGNOSIS — E669 Obesity, unspecified: Secondary | ICD-10-CM

## 2020-09-20 DIAGNOSIS — Z90710 Acquired absence of both cervix and uterus: Secondary | ICD-10-CM

## 2020-09-20 MED ORDER — TAMSULOSIN HCL 0.4 MG PO CAPS: 0.4000 mg | ORAL_CAPSULE | Freq: Every day | ORAL | 1 refills | Status: DC

## 2020-09-20 MED ORDER — PHENTERMINE HCL 37.5 MG PO TABS: ORAL_TABLET | ORAL | 1 refills | Status: DC

## 2020-09-20 MED ORDER — TOVIAZ 4 MG PO TB24: 4.0000 mg | ORAL_TABLET | Freq: Every morning | ORAL | 1 refills | Status: DC

## 2020-09-20 NOTE — Progress Notes (Signed)
  Postoperative Follow-up Patient presents post op from  [facility], also cystoscopy and stent placement for constricted urter noted on xray exams (followed by urology)  for abnormal uterine bleeding, 2 months ago.  Subjective: Patient reports  improvement in her preop symptoms. Eating a regular diet without difficulty.  Pain w intercourse (tried one time).  Also having some RLQ pain, mild, nrcotics.   Activity: normal activities of daily living. Patient reports additional symptom's since surgery of No bleeding.  Objective: BP 120/80   Ht 5\' 6"  (1.676 m)   Wt 191 lb (86.6 kg)   LMP 08/18/2018   BMI 30.83 kg/m  Physical Exam Constitutional:      General: She is not in acute distress.    Appearance: She is well-developed.  Genitourinary:     Rectum normal.     Genitourinary Comments: Cervix and uterus absent. Vaginal cuff healing well.     No vaginal erythema or bleeding.      Right Adnexa: not tender and no mass present.    Left Adnexa: not tender and no mass present.    Cervix is absent.     Uterus is absent.  Cardiovascular:     Rate and Rhythm: Normal rate.  Pulmonary:     Effort: Pulmonary effort is normal.  Abdominal:     General: There is no distension.     Palpations: Abdomen is soft.     Tenderness: There is no abdominal tenderness.     Comments: Incision healing well.  Musculoskeletal:        General: Normal range of motion.  Neurological:     Mental Status: She is alert and oriented to person, place, and time.     Cranial Nerves: No cranial nerve deficit.  Skin:    General: Skin is warm and dry.    Assessment: s/p :  total laparoscopic hysterectomy with bilateral salpingectomy progressing well  Plan: Patient has done well after surgery with no apparent complications.  I have discussed the post-operative course to date, and the expected progress moving forward.  The patient understands what complications to be concerned about.  I will see the patient in routine  follow up, or sooner if needed.    Activity plan: No restriction. To see Urology for stent maintenance and removal and future interventions.  Also discussed weight loss medicine and other measures to help lose weight  08/20/2018 09/20/2020, 11:27 AM

## 2020-09-21 ENCOUNTER — Other Ambulatory Visit: Payer: Self-pay | Admitting: Urology

## 2020-09-21 DIAGNOSIS — N1330 Unspecified hydronephrosis: Secondary | ICD-10-CM

## 2020-09-21 NOTE — Progress Notes (Signed)
Surgical Physician Order Form  ** Scheduling expectation : October 2022  *Length of Case: 30 minutes  *Clearance needed: no  *Anticoagulation Instructions: N/A  *Aspirin Instructions: N/A  *Post-op visit Date/Instructions:   1 month follow-up with PA  *Diagnosis: Right hydronephrosis  *Procedure: Cystoscopy; removal right ureteral stent; right retrograde pyelogram; possible right ureteroscopy  -Admit type: OUTpatient  -Anesthesia: Choice  -VTE Prophylaxis Standing Order SCD's       Other:   -Standing Lab Orders Per Anesthesia    Lab other:  Urine culture  -Standing Test orders EKG/Chest x-ray per Anesthesia       Test other:   - Medications:     Ancef 2gm IV   Other Instructions:

## 2020-09-26 ENCOUNTER — Telehealth: Payer: Self-pay

## 2020-09-26 ENCOUNTER — Telehealth: Payer: Self-pay | Admitting: Urology

## 2020-09-26 NOTE — Telephone Encounter (Signed)
Let her know I will message them again to schedule appointment.

## 2020-09-26 NOTE — Telephone Encounter (Addendum)
Per Dr. Lonna Cobb Patient is to be scheduled for Right Cysto Ureteral Stent Removal  Mrs. Signor was contacted and possible surgical dates were discussed, 10/04/20 was agreed upon for surgery. Patient was instructed that Dr. Lonna Cobb will require them to provide a pre-op UA & CX prior to surgery. This was ordered and scheduled drop off appointment was made for 09/27/20 @ 11:00am.   Patient was directed to call [phone] between 1-3pm the day before surgery to find out surgical arrival time.  Instructions were given not to eat or drink from midnight on the night before surgery and have a driver for the day of surgery. On the surgery day patient was instructed to enter through the Medical Mall entrance of [facility] report the Same Day Surgery desk.   Pre-Admit Testing will be in contact via phone to set up an interview with the anesthesia team to review your history and medications prior to surgery.   Reminder of this information was sent via mychart to the patient.

## 2020-09-26 NOTE — Progress Notes (Signed)
[facility] Posting Form   Surgery Date/Time: Date: 10/04/2020  Surgeon: Dr. Irineo Axon, MD  Surgery Location: Day Surgery  Inpt ( No  )   Outpt (Yes)   Obs ( No  )   Diagnosis: N13.30 Right Hydronephrosis  -CPT: 04888,91694  Surgery: Cystoscopy with Right stent removal with right retrograde pyelogram  -CPT: 52000  Surgery: Possible Right Ureteroscopy  Stop Anticoagulations: No  Cardiac/Medical/Pulmonary Clearance needed: None Needed  *Orders entered into EPIC  Date: 09/26/20   *Case booked in EPIC  Date: 09/26/20  *Notified pt of Surgery: Date: 09/26/20  PRE-OP UA & CX: Yes  *Placed into Prior Authorization Work Que Date: 09/26/20   Assistant/laser/rep:No

## 2020-09-26 NOTE — Telephone Encounter (Signed)
Pt has an appointment Tuesday, to take it out.

## 2020-09-26 NOTE — Telephone Encounter (Signed)
Orders for surgery entered and faxed over to pre-admit testing.

## 2020-09-26 NOTE — Telephone Encounter (Signed)
Patient is calling about an Stent that was placed by [facility]. Patient reports she has called everyday last week with No answer , scheduled appointment nor call back. Patient is report some pain. Please advise

## 2020-09-27 ENCOUNTER — Other Ambulatory Visit: Payer: Medicaid Other

## 2020-09-27 ENCOUNTER — Other Ambulatory Visit: Payer: Self-pay

## 2020-09-27 DIAGNOSIS — N1330 Unspecified hydronephrosis: Secondary | ICD-10-CM | POA: Diagnosis not present

## 2020-09-28 LAB — URINALYSIS, COMPLETE
Bilirubin, UA: NEGATIVE
Glucose, UA: NEGATIVE
Ketones, UA: NEGATIVE
Nitrite, UA: POSITIVE — AB
Specific Gravity, UA: 1.02 (ref 1.005–1.030)
Urobilinogen, Ur: 0.2 mg/dL (ref 0.2–1.0)
pH, UA: 7 (ref 5.0–7.5)

## 2020-09-28 LAB — MICROSCOPIC EXAMINATION

## 2020-09-29 ENCOUNTER — Encounter
Admission: RE | Admit: 2020-09-29 | Discharge: 2020-09-29 | Disposition: A | Discharge status: Home / Self Care | Payer: Medicaid Other | Source: Ambulatory Visit | Attending: Urology | Admitting: Urology

## 2020-09-29 ENCOUNTER — Other Ambulatory Visit: Payer: Self-pay

## 2020-09-29 NOTE — Patient Instructions (Signed)
Your procedure is scheduled on:10-04-20 Tuesday Report to the Registration Desk on the 1st floor of the Medical Mall.Then proceed to the 2nd floor Surgery Desk in the Medical Mall To find out your arrival time, please call [phone] between 1PM - 3PM on:10-03-20 Monday  REMEMBER: Instructions that are not followed completely may result in serious medical risk, up to and including death; or upon the discretion of your surgeon and anesthesiologist your surgery may need to be rescheduled.  Do not eat food OR drink liquids after midnight the night before surgery.  No gum chewing, lozengers or hard candies.  TAKE THESE MEDICATIONS THE MORNING OF SURGERY WITH A SIP OF WATER: -tamsulosin (FLOMAX) 0.4 MG CAPS capsule -topiramate (TOPAMAX) 25 MG tablet -TOVIAZ 4 MG TB24 tablet  Stop your phentermine (ADIPEX-P) 37.5 MG tablet NOW (09-29-20)  One week prior to surgery: Stop Anti-inflammatories (NSAIDS) such as Advil, Aleve, Ibuprofen, Motrin, Naproxen, Naprosyn and Aspirin based products such as Excedrin, Goodys Powder, BC Powder.You may however, take Tylenol if needed for pain up until the day of surgery.  Stop ANY OVER THE COUNTER supplements/vitamins NOW (09-29-20) until after surgery.  No Alcohol for 24 hours before or after surgery.  No Smoking including e-cigarettes for 24 hours prior to surgery.  No chewable tobacco products for at least 6 hours prior to surgery.  No nicotine patches on the day of surgery.  Do not use any "recreational" drugs for at least a week prior to your surgery.  Please be advised that the combination of cocaine and anesthesia may have negative outcomes, up to and including death. If you test positive for cocaine, your surgery will be cancelled.  On the morning of surgery brush your teeth with toothpaste and water, you may rinse your mouth with mouthwash if you wish. Do not swallow any toothpaste or mouthwash.  Do not wear jewelry, make-up, hairpins, clips or  nail polish.  Do not wear lotions, powders, or perfumes.   Do not shave body from the neck down 48 hours prior to surgery just in case you cut yourself which could leave a site for infection.  Also, freshly shaved skin may become irritated if using the CHG soap.  Contact lenses, hearing aids and dentures may not be worn into surgery.  Do not bring valuables to the hospital. [facility] is not responsible for any missing/lost belongings or valuables.   Notify your doctor if there is any change in your medical condition (cold, fever, infection).  Wear comfortable clothing (specific to your surgery type) to the hospital.  After surgery, you can help prevent lung complications by doing breathing exercises.  Take deep breaths and cough every 1-2 hours. Your doctor may order a device called an Incentive Spirometer to help you take deep breaths. When coughing or sneezing, hold a pillow firmly against your incision with both hands. This is called "splinting." Doing this helps protect your incision. It also decreases belly discomfort.  If you are being admitted to the hospital overnight, leave your suitcase in the car. After surgery it may be brought to your room.  If you are being discharged the day of surgery, you will not be allowed to drive home. You will need a responsible adult (18 years or older) to drive you home and stay with you that night.   If you are taking public transportation, you will need to have a responsible adult (18 years or older) with you. Please confirm with your physician that it is acceptable to  use public transportation.   Please call the Pre-admissions Testing Dept. at [phone] if you have any questions about these instructions.  Surgery Visitation Policy:  Patients undergoing a surgery or procedure may have one family member or support person with them as long as that person is not COVID-19 positive or experiencing its symptoms.  That person may remain in  the waiting area during the procedure and may rotate out with other people.  Inpatient Visitation:    Visiting hours are 7 a.m. to 8 p.m. Up to two visitors ages 16+ are allowed at one time in a patient room. The visitors may rotate out with other people during the day. Visitors must check out when they leave, or other visitors will not be allowed. One designated support person may remain overnight. The visitor must pass COVID-19 screenings, use hand sanitizer when entering and exiting the patient's room and wear a mask at all times, including in the patient's room. Patients must also wear a mask when staff or their visitor are in the room. Masking is required regardless of vaccination status.

## 2020-09-30 LAB — CULTURE, URINE COMPREHENSIVE

## 2020-10-04 ENCOUNTER — Encounter
Admission: RE | Disposition: A | Discharge status: Home / Self Care | Payer: Self-pay | Source: Home / Self Care | Attending: Urology

## 2020-10-04 ENCOUNTER — Ambulatory Visit: Payer: Medicaid Other | Admitting: Anesthesiology

## 2020-10-04 ENCOUNTER — Ambulatory Visit
Admission: RE | Admit: 2020-10-04 | Discharge: 2020-10-04 | Disposition: A | Discharge status: Home / Self Care | Payer: Medicaid Other | Attending: Urology | Admitting: Urology

## 2020-10-04 ENCOUNTER — Encounter: Payer: Self-pay | Admitting: Urology

## 2020-10-04 ENCOUNTER — Other Ambulatory Visit: Payer: Self-pay

## 2020-10-04 ENCOUNTER — Ambulatory Visit: Payer: Medicaid Other

## 2020-10-04 DIAGNOSIS — Z8349 Family history of other endocrine, nutritional and metabolic diseases: Secondary | ICD-10-CM | POA: Diagnosis not present

## 2020-10-04 DIAGNOSIS — Z8041 Family history of malignant neoplasm of ovary: Secondary | ICD-10-CM | POA: Insufficient documentation

## 2020-10-04 DIAGNOSIS — N1330 Unspecified hydronephrosis: Secondary | ICD-10-CM

## 2020-10-04 DIAGNOSIS — Z87891 Personal history of nicotine dependence: Secondary | ICD-10-CM | POA: Diagnosis present

## 2020-10-04 DIAGNOSIS — Z803 Family history of malignant neoplasm of breast: Secondary | ICD-10-CM | POA: Insufficient documentation

## 2020-10-04 DIAGNOSIS — Z8261 Family history of arthritis: Secondary | ICD-10-CM | POA: Diagnosis not present

## 2020-10-04 DIAGNOSIS — Z833 Family history of diabetes mellitus: Secondary | ICD-10-CM | POA: Insufficient documentation

## 2020-10-04 DIAGNOSIS — Z8249 Family history of ischemic heart disease and other diseases of the circulatory system: Secondary | ICD-10-CM | POA: Diagnosis not present

## 2020-10-04 DIAGNOSIS — Z466 Encounter for fitting and adjustment of urinary device: Secondary | ICD-10-CM | POA: Diagnosis not present

## 2020-10-04 DIAGNOSIS — N135 Crossing vessel and stricture of ureter without hydronephrosis: Secondary | ICD-10-CM

## 2020-10-04 DIAGNOSIS — Z823 Family history of stroke: Secondary | ICD-10-CM | POA: Diagnosis not present

## 2020-10-04 DIAGNOSIS — Z8379 Family history of other diseases of the digestive system: Secondary | ICD-10-CM | POA: Insufficient documentation

## 2020-10-04 DIAGNOSIS — Z79899 Other long term (current) drug therapy: Secondary | ICD-10-CM | POA: Insufficient documentation

## 2020-10-04 HISTORY — PX: CYSTOSCOPY W/ URETERAL STENT REMOVAL: SHX1430

## 2020-10-04 HISTORY — PX: CYSTOSCOPY W/ RETROGRADES: SHX1426

## 2020-10-04 HISTORY — PX: URETEROSCOPY: SHX842

## 2020-10-04 SURGERY — REMOVAL, STENT, URETER, CYSTOSCOPIC
Anesthesia: General | Laterality: Right

## 2020-10-04 MED ORDER — DEXAMETHASONE SODIUM PHOSPHATE 10 MG/ML IJ SOLN
INTRAMUSCULAR | Status: DC | PRN
  Administered 2020-10-04: 10 mg via INTRAVENOUS

## 2020-10-04 MED ORDER — FAMOTIDINE 20 MG PO TABS
ORAL_TABLET | ORAL | Status: AC
  Administered 2020-10-04: 20 mg via ORAL
  Filled 2020-10-04: qty 1

## 2020-10-04 MED ORDER — ONDANSETRON HCL 4 MG/2ML IJ SOLN: 4.0000 mg | Freq: Once | INTRAMUSCULAR | Status: DC | PRN

## 2020-10-04 MED ORDER — IOHEXOL 180 MG/ML  SOLN
INTRAMUSCULAR | Status: DC | PRN
  Administered 2020-10-04: 20 mL

## 2020-10-04 MED ORDER — FENTANYL CITRATE (PF) 100 MCG/2ML IJ SOLN
INTRAMUSCULAR | Status: DC | PRN
  Administered 2020-10-04 (×2): 50 ug via INTRAVENOUS

## 2020-10-04 MED ORDER — LIDOCAINE HCL (CARDIAC) PF 100 MG/5ML IV SOSY
PREFILLED_SYRINGE | INTRAVENOUS | Status: DC | PRN
  Administered 2020-10-04: 100 mg via INTRAVENOUS

## 2020-10-04 MED ORDER — OXYCODONE HCL 5 MG PO TABS
5.0000 mg | ORAL_TABLET | Freq: Once | ORAL | Status: DC | PRN
Start: 2020-10-04 — End: 2020-10-04

## 2020-10-04 MED ORDER — PROPOFOL 10 MG/ML IV BOLUS
INTRAVENOUS | Status: AC
  Filled 2020-10-04: qty 20

## 2020-10-04 MED ORDER — MIDAZOLAM HCL 2 MG/2ML IJ SOLN
INTRAMUSCULAR | Status: DC | PRN
  Administered 2020-10-04: 2 mg via INTRAVENOUS

## 2020-10-04 MED ORDER — OXYCODONE HCL 5 MG/5ML PO SOLN
5.0000 mg | Freq: Once | ORAL | Status: DC | PRN
Start: 2020-10-04 — End: 2020-10-04

## 2020-10-04 MED ORDER — FENTANYL CITRATE (PF) 100 MCG/2ML IJ SOLN: 25.0000 ug | INTRAMUSCULAR | Status: DC | PRN

## 2020-10-04 MED ORDER — CEFAZOLIN SODIUM-DEXTROSE 2-4 GM/100ML-% IV SOLN
INTRAVENOUS | Status: AC
  Filled 2020-10-04: qty 100

## 2020-10-04 MED ORDER — CHLORHEXIDINE GLUCONATE 0.12 % MT SOLN
OROMUCOSAL | Status: AC
  Administered 2020-10-04: 15 mL via OROMUCOSAL
  Filled 2020-10-04: qty 15

## 2020-10-04 MED ORDER — CHLORHEXIDINE GLUCONATE 0.12 % MT SOLN: 15.0000 mL | Freq: Once | OROMUCOSAL | Status: AC

## 2020-10-04 MED ORDER — ONDANSETRON HCL 4 MG/2ML IJ SOLN
INTRAMUSCULAR | Status: DC | PRN
  Administered 2020-10-04: 4 mg via INTRAVENOUS

## 2020-10-04 MED ORDER — ACETAMINOPHEN 10 MG/ML IV SOLN: 1000.0000 mg | Freq: Once | INTRAVENOUS | Status: DC | PRN

## 2020-10-04 MED ORDER — LACTATED RINGERS IV SOLN: INTRAVENOUS | Status: DC

## 2020-10-04 MED ORDER — MIDAZOLAM HCL 2 MG/2ML IJ SOLN
INTRAMUSCULAR | Status: AC
  Filled 2020-10-04: qty 2

## 2020-10-04 MED ORDER — CEFAZOLIN SODIUM-DEXTROSE 2-4 GM/100ML-% IV SOLN
2.0000 g | INTRAVENOUS | Status: AC
  Administered 2020-10-04: 2 g via INTRAVENOUS

## 2020-10-04 MED ORDER — PROPOFOL 10 MG/ML IV BOLUS
INTRAVENOUS | Status: DC | PRN
  Administered 2020-10-04: 50 mg via INTRAVENOUS
  Administered 2020-10-04: 150 mg via INTRAVENOUS

## 2020-10-04 MED ORDER — FAMOTIDINE 20 MG PO TABS: 20.0000 mg | ORAL_TABLET | Freq: Once | ORAL | Status: AC

## 2020-10-04 MED ORDER — SODIUM CHLORIDE 0.9 % IR SOLN
Status: DC | PRN
Start: 2020-10-04 — End: 2020-10-04
  Administered 2020-10-04: 1000 mL

## 2020-10-04 MED ORDER — ORAL CARE MOUTH RINSE: 15.0000 mL | Freq: Once | OROMUCOSAL | Status: AC

## 2020-10-04 MED ORDER — STERILE WATER FOR IRRIGATION IR SOLN
Status: DC | PRN
  Administered 2020-10-04: 500 mL

## 2020-10-04 MED ORDER — FENTANYL CITRATE (PF) 100 MCG/2ML IJ SOLN
INTRAMUSCULAR | Status: AC
  Filled 2020-10-04: qty 2

## 2020-10-04 MED ORDER — PHENYLEPHRINE HCL (PRESSORS) 10 MG/ML IV SOLN
INTRAVENOUS | Status: DC | PRN
  Administered 2020-10-04 (×3): 100 ug via INTRAVENOUS

## 2020-10-04 SURGICAL SUPPLY — 30 items
BAG DRAIN CYSTO-URO LG1000N (MISCELLANEOUS) ×2 IMPLANT
BRUSH SCRUB EZ  4% CHG (MISCELLANEOUS) ×1
BRUSH SCRUB EZ 1% IODOPHOR (MISCELLANEOUS) ×2 IMPLANT
BRUSH SCRUB EZ 4% CHG (MISCELLANEOUS) ×1 IMPLANT
CATH URET FLEX-TIP 2 LUMEN 10F (CATHETERS) IMPLANT
CATH URETL OPEN 5X70 (CATHETERS) IMPLANT
CNTNR SPEC 2.5X3XGRAD LEK (MISCELLANEOUS)
CONRAY 43 FOR UROLOGY 50M (MISCELLANEOUS) IMPLANT
CONT SPEC 4OZ STER OR WHT (MISCELLANEOUS)
CONT SPEC 4OZ STRL OR WHT (MISCELLANEOUS)
CONTAINER SPEC 2.5X3XGRAD LEK (MISCELLANEOUS) IMPLANT
DRAPE UTILITY 15X26 TOWEL STRL (DRAPES) ×2 IMPLANT
GAUZE 4X4 16PLY ~~LOC~~+RFID DBL (SPONGE) ×4 IMPLANT
GLOVE SURG UNDER POLY LF SZ7.5 (GLOVE) ×2 IMPLANT
GOWN STRL REUS W/ TWL LRG LVL3 (GOWN DISPOSABLE) ×2 IMPLANT
GOWN STRL REUS W/ TWL XL LVL3 (GOWN DISPOSABLE) ×1 IMPLANT
GOWN STRL REUS W/TWL LRG LVL3 (GOWN DISPOSABLE) ×4
GOWN STRL REUS W/TWL XL LVL3 (GOWN DISPOSABLE) ×2
GUIDEWIRE GREEN .038 145CM (MISCELLANEOUS) IMPLANT
GUIDEWIRE STR DUAL SENSOR (WIRE) ×2 IMPLANT
INFUSOR MANOMETER BAG 3000ML (MISCELLANEOUS) ×2 IMPLANT
IV NS IRRIG 3000ML ARTHROMATIC (IV SOLUTION) ×2 IMPLANT
KIT TURNOVER CYSTO (KITS) ×2 IMPLANT
MANIFOLD NEPTUNE II (INSTRUMENTS) IMPLANT
PACK CYSTO AR (MISCELLANEOUS) ×2 IMPLANT
SET CYSTO W/LG BORE CLAMP LF (SET/KITS/TRAYS/PACK) ×2 IMPLANT
SHEATH URETERAL 12FRX35CM (MISCELLANEOUS) IMPLANT
SURGILUBE 2OZ TUBE FLIPTOP (MISCELLANEOUS) ×2 IMPLANT
WATER STERILE IRR 1000ML POUR (IV SOLUTION) ×2 IMPLANT
WATER STERILE IRR 500ML POUR (IV SOLUTION) ×2 IMPLANT

## 2020-10-04 NOTE — H&P (Signed)
Urology H&P   History of Present Illness: Kristen Mcpherson is a 40 y.o. female status post total laparoscopic hysterectomy with salpingectomy 07/19/2020.  Post procedure cystoscopy showed no efflux from the right UO.  Intraoperative right retrograde pyelogram showed moderate to severe right hydronephrosis/hydroureter with tapering at the midportion of the right distal ureter.  Ureteroscopy showed no intrinsic abnormalities and there appeared to be extrinsic compression.  A ureteral stent was placed.  She presents today for removal right ureteral stent, retrograde pyelogram, possible right ureteroscopy.  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 Antibody:  Anatomic [place]  complete Rubella: Immune (07/19 0000)  Varicella: Immune GTT Early: WNLThird trimester:  RPR: Nonreactive (07/19 0000)  Rhogam  not needed HBsAg: Negative (07/19 0000)  TDaP vaccine                     Iron deficiency anemia 01/08/2018    Past Surgical History:  Procedure Laterality Date   APPENDECTOMY     CERVICAL CONIZATION W/BX N/A 06/12/2018   Procedure: CONIZATION CERVIX WITH BIOPSY - COLD KNIFE;  Surgeon: Nadara Mustard, MD;  Location: [facility] ORS;  Service: Gynecology;  Laterality: N/A;   CESAREAN SECTION N/A 03/20/2018   Procedure: CESAREAN SECTION;  Surgeon: Nadara Mustard, MD;  Location: [facility] OR[facility];  Service: Obstetrics;  Laterality: N/A;   CYSTOSCOPY  07/19/2020    Procedure: CYSTOSCOPY;  Surgeon: Nadara Mustard, MD;  Location: [facility] ORS;  Service: Gynecology;;   Bluford Kaufmann W/ RETROGRADES  07/19/2020   Procedure: CYSTOSCOPY WITH RETROGRADE PYELOGRAM;  Surgeon: Riki Altes, MD;  Location: [facility] ORS;  Service: Urology;;   CYSTOSCOPY W/ URETERAL STENT PLACEMENT Right 07/19/2020   Procedure: CYSTOSCOPY WITH RETROGRADE PYELOGRAM/URETERAL STENT PLACEMENT;  Surgeon: Riki Altes, MD;  Location: [facility] OR[facility];  Service: Urology;  Laterality: Right;   CYSTOSCOPY WITH URETEROSCOPY AND STENT PLACEMENT Right 08/16/2020   Procedure: CYSTOSCOPY WITH URETEROSCOPY AND STENT EXCHANGE;  Surgeon: Riki Altes, MD;  Location: [facility] OR[facility];  Service: Urology;  Laterality: Right;   TOTAL LAPAROSCOPIC HYSTERECTOMY WITH SALPINGECTOMY Bilateral 07/19/2020   Procedure: TOTAL LAPAROSCOPIC HYSTERECTOMY WITH SALPINGECTOMY;  Surgeon: Nadara Mustard, MD;  Location: [facility] OR[facility];  Service: Gynecology;  Laterality: Bilateral;   TUBAL LIGATION Bilateral 03/20/2018   Procedure: BILATERAL TUBAL LIGATION;  Surgeon: Nadara Mustard, MD;  Location: [facility] OR[facility];  Service: Obstetrics;  Laterality: Bilateral;    Home Medications:  Current Meds  Medication Sig   acetaminophen (TYLENOL) 500 MG tablet Take 500-1,000 mg by mouth every 6 (six) hours as needed (for pain.).   phentermine (ADIPEX-P) 37.5 MG tablet One tablet po in morning. (Patient taking differently: Take 37.5 mg by mouth daily before breakfast. One tablet po in morning.)   tamsulosin (FLOMAX) 0.4 MG CAPS capsule Take 1 capsule (0.4 mg total) by mouth daily. (Patient taking differently: Take 0.4 mg by mouth daily after breakfast.)   topiramate (  TOPAMAX) 25 MG tablet TAKE 1 TABLET BY MOUTH TWICE A DAY   TOVIAZ 4 MG TB24 tablet Take 1 tablet (4 mg total) by mouth in the morning.    Allergies: No Known Allergies  Family History  Problem Relation Age of Onset   Diabetes Mother    Arthritis Mother    Depression Mother    Anxiety  disorder Mother    Anemia Mother    Stroke Mother    Restless legs syndrome Mother    Hyperlipidemia Father    Depression Father    Hypertension Father    ADD / ADHD Brother    Diabetes Maternal Grandmother    Liver disease Maternal Grandmother    Breast cancer Maternal Grandmother    Rheum arthritis Maternal Grandfather    Dementia Maternal Grandfather    Breast cancer Paternal Grandmother    Ovarian cancer Paternal Aunt     Social History:  reports that she quit smoking about 7 months ago. Her smoking use included cigarettes. She started smoking about 20 years ago. She smoked an average of .5 packs per day. She has never used smokeless tobacco. She reports current alcohol use. She reports that she does not use drugs.  ROS: Otherwise noncontributory except as per the HPI  Physical Exam:  Vital signs in last 24 hours: Temp:  [98 F (36.7 C)] 98 F (36.7 C) (10/04 1307) Pulse Rate:  [87] 87 (10/04 1307) Resp:  [16] 16 (10/04 1307) BP: (108)/(75) 108/75 (10/04 1307) SpO2:  [100 %] 100 % (10/04 1307) Weight:  [85.3 kg] 85.3 kg (10/04 1307) Constitutional:  Alert and oriented, No acute distress HEENT: [place] AT, moist mucus membranes.  Trachea midline, no masses Cardiovascular: Regular rate and rhythm, no clubbing, cyanosis, or edema. Respiratory: Normal respiratory effort, lungs clear bilaterally  Laboratory Data:  No results for input(s): WBC, HGB, HCT in the last 72 hours. No results for input(s): NA, K, CL, CO2, GLUCOSE, BUN, CREATININE, CALCIUM in the last 72 hours. No results for input(s): LABPT, INR in the last 72 hours. No results for input(s): LABURIN in the last 72 hours. Results for orders placed or performed in visit on 09/27/20  CULTURE, URINE COMPREHENSIVE     Status: None   Collection Time: 09/27/20 12:05 PM   Specimen: Urine   UR  Result Value Ref Range Status   Urine Culture, Comprehensive Final report  Final   Organism ID, Bacteria Comment  Final     Comment: Mixed urogenital flora 10,000-25,000 colony forming units per mL   Microscopic Examination     Status: Abnormal   Collection Time: 09/27/20 12:05 PM   Urine  Result Value Ref Range Status   WBC, UA 6-10 (A) 0 - 5 /hpf Final   RBC 11-30 (A) 0 - 2 /hpf Final   Epithelial Cells (non renal) 0-10 0 - 10 /hpf Final   Crystals Present (A) N/A Final   Crystal Type Calcium Oxalate N/A Final    Comment: Amorphous Sediment   Bacteria, UA Moderate (A) None seen/Few Final     Impression/Plan:  40 y.o. female with right hydronephrosis/hydroureter to the distal ureter.  Presents with cystoscopy with stent removal, right retrograde pyelogram and possible right ureteroscopy.  The procedure was discussed in detail.  All questions were answered and she desires to proceed    10/04/2020, 2:27 PM  Irineo Axon,  MD

## 2020-10-04 NOTE — Anesthesia Procedure Notes (Signed)
Procedure Name: LMA Insertion Date/Time: 10/04/2020 2:59 PM Performed by: Rodney Booze, CRNA Pre-anesthesia Checklist: Patient identified, Emergency Drugs available, Suction available and Patient being monitored Patient Re-evaluated:Patient Re-evaluated prior to induction Oxygen Delivery Method: Circle system utilized Preoxygenation: Pre-oxygenation with 100% oxygen Induction Type: IV induction Ventilation: Mask ventilation without difficulty LMA: LMA inserted LMA Size: 4.0 Number of attempts: 1 Placement Confirmation: positive ETCO2 and breath sounds checked- equal and bilateral Tube secured with: Tape Dental Injury: Teeth and Oropharynx as per pre-operative assessment

## 2020-10-04 NOTE — Anesthesia Preprocedure Evaluation (Signed)
Anesthesia Evaluation  Patient identified by MRN, date of birth, ID band Patient awake    Reviewed: Allergy & Precautions, NPO status , Patient's Chart, lab work & pertinent test results  History of Anesthesia Complications Negative for: history of anesthetic complications  Airway Mallampati: III   Neck ROM: Full    Dental  (+)    Pulmonary asthma , former smoker (quit 02/2020),    Pulmonary exam normal breath sounds clear to auscultation       Cardiovascular Exercise Tolerance: Good negative cardio ROS Normal cardiovascular exam Rhythm:Regular Rate:Normal  ECG 12/24/19: Sinus tachycardia (HR 127)   Neuro/Psych PSYCHIATRIC DISORDERS Anxiety Depression negative neurological ROS     GI/Hepatic negative GI ROS,   Endo/Other  negative endocrine ROS  Renal/GU negative Renal ROS     Musculoskeletal   Abdominal   Peds  Hematology  (+) Blood dyscrasia, anemia ,   Anesthesia Other Findings   Reproductive/Obstetrics                             Anesthesia Physical Anesthesia Plan  ASA: 2  Anesthesia Plan: General   Post-op Pain Management:    Induction: Intravenous  PONV Risk Score and Plan: 3 and Ondansetron, Dexamethasone and Treatment may vary due to age or medical condition  Airway Management Planned: LMA  Additional Equipment:   Intra-op Plan:   Post-operative Plan: Extubation in OR  Informed Consent: I have reviewed the patients History and Physical, chart, labs and discussed the procedure including the risks, benefits and alternatives for the proposed anesthesia with the patient or authorized representative who has indicated his/her understanding and acceptance.       Plan Discussed with: CRNA  Anesthesia Plan Comments:         Anesthesia Quick Evaluation

## 2020-10-04 NOTE — Transfer of Care (Signed)
Immediate Anesthesia Transfer of Care Note  Patient: Kristen Mcpherson  Procedure(s) Performed: CYSTOSCOPY WITH STENT REMOVAL (Right) CYSTOSCOPY WITH RETROGRADE PYELOGRAM (Right) URETEROSCOPY (Right)  Patient Location: PACU  Anesthesia Type:General  Level of Consciousness: drowsy  Airway & Oxygen Therapy: Patient Spontanous Breathing and Patient connected to face mask oxygen  Post-op Assessment: Report given to RN and Post -op Vital signs reviewed and stable  Post vital signs: Reviewed and stable  Last Vitals:  Vitals Value Taken Time  BP 105/65 10/04/20 1515  Temp    Pulse 63 10/04/20 1516  Resp 17 10/04/20 1516  SpO2 100 % 10/04/20 1516  Vitals shown include unvalidated device data.  Last Pain:  Vitals:   10/04/20 1307  TempSrc: Temporal  PainSc: 0-No pain         Complications: No notable events documented.

## 2020-10-04 NOTE — Interval H&P Note (Signed)
History and Physical Interval Note:  10/04/2020 2:31 PM  Kristen Mcpherson  has presented today for surgery, with the diagnosis of right Hydronephrosis.  The various methods of treatment have been discussed with the patient and family. After consideration of risks, benefits and other options for treatment, the patient has consented to  Procedure(s): CYSTOSCOPY WITH STENT REMOVAL (Right) CYSTOSCOPY WITH RETROGRADE PYELOGRAM (Right) URETEROSCOPY (Right) as a surgical intervention.  The patient's history has been reviewed, patient examined, no change in status, stable for surgery.  I have reviewed the patient's chart and labs.  Questions were answered to the patient's satisfaction.     Kristen Mcpherson C Estil Vallee

## 2020-10-04 NOTE — Op Note (Signed)
Preoperative diagnosis: Right distal ureteral obstruction  Postoperative diagnosis: Normal right ureter without dilation/obstruction  Procedure:  Cystoscopy with stent removal Right ureteroscopy-diagnostic Right retrograde pyelogram  Surgeon: Lorin Picket C. Doyce Stonehouse, M.D.  Anesthesia: General  Complications: None  Intraoperative findings:  1.  Ureteroscopy-normal caliber distal and proximal ureter without evidence of external obstruction  2.  Right retrograde pyelogram-normal caliber ureter with prompt emptying seen on real-time fluoroscopy.  Mild right hydronephrosis which appears chronic  EBL: None  Specimens: None   Indication: Kristen Mcpherson is a 40 y.o.  female status post total laparoscopic hysterectomy with salpingectomy 07/19/2020.  Post procedure cystoscopy showed no efflux from the right UO.  Intraoperative right retrograde pyelogram showed moderate to severe right hydronephrosis/hydroureter with tapering at the midportion of the right distal ureter.  Ureteroscopy showed no intrinsic abnormalities and there appeared to be extrinsic compression.  A ureteral stent was placed.  She presents today for removal right ureteral stent, retrograde pyelogram, possible right ureteroscopy. After reviewing the management options for treatment, the patient elected to proceed with the above surgical procedure(s). We have discussed the potential benefits and risks of the procedure, side effects of the proposed treatment, the likelihood of the patient achieving the goals of the procedure, and any potential problems that might occur during the procedure or recuperation. Informed consent has been obtained.  Description of procedure:  The patient was taken to the operating room and general anesthesia was induced.  The patient was placed in the dorsal lithotomy position, prepped and draped in the usual sterile fashion, and preoperative antibiotics were administered. A preoperative time-out was  performed.   A 21 French cystoscope was lubricated and passed per urethra.  Other than inflammatory changes of the right hemitrigone secondary to the indwelling stent no bladder mucosal abnormalities were noted.  The stent was grasped with endoscopic forceps and brought out through the urethral meatus.  A 0.038 Sensor wire was placed through the ureteral stent to the renal pelvis and the stent was removed.  A 4.5 French semirigid ureteroscope was passed per urethra.  The right ureteral orifice was easily engaged without dilation and advanced to the mid ureter with findings as described above.  No ureteral mucosal abnormalities noted and no evidence of external compression.  The guidewire was removed and pullout right retrograde pyelogram was performed through the ureteroscope with findings as described above.  The bladder was then emptied and the procedure ended.  The patient appeared to tolerate the procedure well and without complications.  After anesthetic reversal the patient was transported to the PACU in stable condition.   Plan: 1.  Office visit 3 months with a renal ultrasound prior   Irineo Axon, MD

## 2020-10-04 NOTE — Discharge Instructions (Addendum)
Discharge instructions  Your urinary symptoms should improve after stent removal Please call office [phone] for worsening symptoms or fever greater than 101 degrees You will be contacted for an office visit with renal ultrasound in approximately 3 months  AMBULATORY SURGERY  DISCHARGE INSTRUCTIONS   The drugs that you were given will stay in your system until tomorrow so for the next 24 hours you should not:  Drive an automobile Make any legal decisions Drink any alcoholic beverage   You may resume regular meals tomorrow.  Today it is better to start with liquids and gradually work up to solid foods.  You may eat anything you prefer, but it is better to start with liquids, then soup and crackers, and gradually work up to solid foods.   Please notify your doctor immediately if you have any unusual bleeding, trouble breathing, redness and pain at the surgery site, drainage, fever, or pain not relieved by medication.    Additional Instructions:     Please contact your physician with any problems or Same Day Surgery at [phone], Monday through Friday 6 am to 4 pm, or [place] [facility] number at [phone].

## 2020-10-05 ENCOUNTER — Encounter: Payer: Self-pay | Admitting: Urology

## 2020-10-05 ENCOUNTER — Other Ambulatory Visit: Payer: Self-pay | Admitting: Urology

## 2020-10-05 DIAGNOSIS — N1330 Unspecified hydronephrosis: Secondary | ICD-10-CM

## 2020-10-05 NOTE — Anesthesia Postprocedure Evaluation (Signed)
Anesthesia Post Note  Patient: Kristen Mcpherson  Procedure(s) Performed: CYSTOSCOPY WITH STENT REMOVAL (Right) CYSTOSCOPY WITH RETROGRADE PYELOGRAM (Right) URETEROSCOPY (Right)  Patient location during evaluation: PACU Anesthesia Type: General Level of consciousness: awake and alert, oriented and patient cooperative Pain management: pain level controlled Vital Signs Assessment: post-procedure vital signs reviewed and stable Respiratory status: spontaneous breathing, nonlabored ventilation and respiratory function stable Cardiovascular status: blood pressure returned to baseline and stable Postop Assessment: adequate PO intake Anesthetic complications: no   No notable events documented.   Last Vitals:  Vitals:   10/04/20 1600 10/04/20 1607  BP: 106/76 112/65  Pulse: 66 66  Resp: 20 16  Temp:  37 C  SpO2: 100% 100%    Last Pain:  Vitals:   10/04/20 1607  TempSrc:   PainSc: 0-No pain                 Reed Breech

## 2020-10-14 ENCOUNTER — Other Ambulatory Visit: Payer: Self-pay | Admitting: Obstetrics & Gynecology

## 2020-11-03 ENCOUNTER — Ambulatory Visit: Payer: Medicaid Other | Admitting: Urology

## 2020-11-21 ENCOUNTER — Ambulatory Visit (INDEPENDENT_AMBULATORY_CARE_PROVIDER_SITE_OTHER): Payer: Medicaid Other | Admitting: Obstetrics & Gynecology

## 2020-11-21 ENCOUNTER — Encounter: Payer: Self-pay | Admitting: Obstetrics & Gynecology

## 2020-11-21 ENCOUNTER — Other Ambulatory Visit: Payer: Self-pay

## 2020-11-21 ENCOUNTER — Ambulatory Visit: Payer: Medicaid Other | Admitting: Obstetrics & Gynecology

## 2020-11-21 VITALS — Ht 66.0 in | Wt 180.0 lb

## 2020-11-21 DIAGNOSIS — E663 Overweight: Secondary | ICD-10-CM | POA: Diagnosis not present

## 2020-11-21 MED ORDER — PHENTERMINE HCL 37.5 MG PO TABS: 37.5000 mg | ORAL_TABLET | Freq: Every day | ORAL | 1 refills | Status: AC

## 2020-11-21 NOTE — Progress Notes (Signed)
Virtual Visit via Telephone Note  I connected with Kristen Mcpherson on 11/21/20 at 10:20 AM EST by telephone and verified that I am speaking with the correct person using two identifiers.  Location: Patient: Home Provider: Office   I discussed the limitations, risks, security and privacy concerns of performing an evaluation and management service by telephone and the availability of in person appointments. I also discussed with the patient that there may be a patient responsible charge related to this service. The patient expressed understanding and agreed to proceed.   History of Present Illness:   Kristen Mcpherson is a 40 y.o. who was started on  Phentermine approximately 2 months ago due to obesity/abnormal weight gain. The patient has lost 11 pounds over the past 2 mos due to meds..   She has these side effects: none.  PMHx: She  has a past medical history of AMA (advanced maternal age) multigravida 37+, second trimester (08/29/2017), Anemia, Anxiety, Asthma, Breech presentation delivered (03/20/2018), Cesarean delivery indicated due to breech presentation (04/03/2018), Family history of breast cancer, Family history of ovarian cancer, High-risk pregnancy, third trimester (08/29/2017), and Iron deficiency anemia (01/08/2018). Also,  has a past surgical history that includes Appendectomy; Cesarean section (N/A, 03/20/2018); Tubal ligation (Bilateral, 03/20/2018); Cervical conization w/bx (N/A, 06/12/2018); Total laparoscopic hysterectomy with salpingectomy (Bilateral, 07/19/2020); Cystoscopy (07/19/2020); Cystoscopy w/ retrogrades (07/19/2020); Cystoscopy w/ ureteral stent placement (Right, 07/19/2020); Cystoscopy with ureteroscopy and stent placement (Right, 08/16/2020); Cystoscopy w/ ureteral stent removal (Right, 10/04/2020); Cystoscopy w/ retrogrades (Right, 10/04/2020); and Ureteroscopy (Right, 10/04/2020)., family history includes ADD / ADHD in her brother; Anemia in her mother; Anxiety  disorder in her mother; Arthritis in her mother; Breast cancer in her maternal grandmother and paternal grandmother; Dementia in her maternal grandfather; Depression in her father and mother; Diabetes in her maternal grandmother and mother; Hyperlipidemia in her father; Hypertension in her father; Liver disease in her maternal grandmother; Ovarian cancer in her paternal aunt; Restless legs syndrome in her mother; Rheum arthritis in her maternal grandfather; Stroke in her mother.,  reports that she quit smoking about 8 months ago. Her smoking use included cigarettes. She started smoking about 20 years ago. She smoked an average of .5 packs per day. She has never used smokeless tobacco. She reports current alcohol use. She reports that she does not use drugs.  She has a current medication list which includes the following prescription(s): acetaminophen, topiramate, and phentermine. Also, has No Known Allergies.  Review of Systems  All other systems reviewed and are negative.  Observations/Objective: No exam today, due to telephone eVisit due to [facility] virus restriction on elective visits and procedures.  Prior visits reviewed along with ultrasounds/labs as indicated. Home weight 180 lbs  Assessment and Plan: 1. Overweight (BMI 25.0-29.9)  The risks and benefits as well as side effects of medication, such as Phenteramine or Tenuate, is discussed.  The pros and cons of suppressing appetite and boosting metabolism is counseled.  Risks of tolerance and addiction discussed.  Use of medicine will be short term.  Pt to call with any negative side effects and agrees to keep follow up appointments.  Follow Up Instructions: 2 months then stop medicine for a break   I discussed the assessment and treatment plan with the patient. The patient was provided an opportunity to ask questions and all were answered. The patient agreed with the plan and demonstrated an understanding of the instructions.   The patient  was advised to call back or seek an  in-person evaluation if the symptoms worsen or if the condition fails to improve as anticipated.  I provided 15 minutes of non-face-to-face time during this encounter.   Hoyt Koch, MD

## 2020-12-13 ENCOUNTER — Encounter: Payer: Self-pay | Admitting: Oncology

## 2020-12-20 ENCOUNTER — Ambulatory Visit: Payer: Medicaid Other

## 2020-12-27 ENCOUNTER — Ambulatory Visit
Admission: RE | Admit: 2020-12-27 | Discharge: 2020-12-27 | Disposition: A | Discharge status: Home / Self Care | Payer: Medicaid Other | Source: Ambulatory Visit | Attending: Urology | Admitting: Urology

## 2020-12-27 ENCOUNTER — Other Ambulatory Visit: Payer: Self-pay

## 2020-12-27 DIAGNOSIS — N1330 Unspecified hydronephrosis: Secondary | ICD-10-CM | POA: Insufficient documentation

## 2020-12-29 ENCOUNTER — Encounter: Payer: Self-pay | Admitting: Oncology

## 2021-01-05 ENCOUNTER — Ambulatory Visit (INDEPENDENT_AMBULATORY_CARE_PROVIDER_SITE_OTHER): Payer: Medicaid Other | Admitting: Urology

## 2021-01-05 ENCOUNTER — Encounter: Payer: Self-pay | Admitting: Oncology

## 2021-01-05 ENCOUNTER — Other Ambulatory Visit: Payer: Self-pay

## 2021-01-05 ENCOUNTER — Encounter: Payer: Self-pay | Admitting: Urology

## 2021-01-05 VITALS — BP 111/77 | HR 99 | Ht 66.0 in | Wt 186.0 lb

## 2021-01-05 DIAGNOSIS — N1330 Unspecified hydronephrosis: Secondary | ICD-10-CM

## 2021-01-05 NOTE — Progress Notes (Signed)
01/05/2021 1:42 PM   Anderson [place] Oneita Kras September 08, 1980 [id number]  Referring provider: No referring provider defined for this encounter.  Chief Complaint  Patient presents with   Other    HPI: 41 y.o. female presents for follow-up visit.  status post total laparoscopic hysterectomy with salpingectomy 07/19/2020.  Post procedure cystoscopy showed no efflux from the right UO.  Intraoperative right retrograde pyelogram showed moderate to severe right hydronephrosis/hydroureter with tapering at the midportion of the right distal ureter.  Ureteroscopy showed no intrinsic abnormalities and there appeared to be extrinsic compression It was felt her ureter may have been kinked and vaginal cuff closure.  The stent was left indwelling ~ 10 weeks She underwent removal under anesthesia with right retrograde pyelogram which showed mild right hydronephrosis that was felt to be chronic but a normal caliber ureter without evidence of obstruction and prompt emptying seen on real-time fluoroscopy She notes intermittent right lower quadrant abdominal pain that radiates to the right flank.  She states this occurs daily but is mild.  A renal ultrasound performed 12/27/2020 showed minimal right hydronephrosis which was significantly decreased from prior CT   PMH: Past Medical History:  Diagnosis Date   AMA (advanced maternal age) multigravida 4+, second trimester 08/29/2017   Anemia    Anxiety    Asthma    Breech presentation delivered 03/20/2018   Cesarean delivery indicated due to breech presentation 04/03/2018   Family history of breast cancer    4/22 cancer genetic testing letter sent   Family history of ovarian cancer    High-risk pregnancy, third trimester 08/29/2017   [facility] Prenatal Labs Dating [place] Blood type: A/Positive/-- (07/19 0000) A + Genetic Screen NIPS: Normal 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 Antibody:  Anatomic [place]  complete Rubella: Immune  (07/19 0000)  Varicella: Immune GTT Early: WNLThird trimester:  RPR: Nonreactive (07/19 0000)  Rhogam  not needed HBsAg: Negative (07/19 0000)  TDaP vaccine                     Iron deficiency anemia 01/08/2018    Surgical History: Past Surgical History:  Procedure Laterality Date   APPENDECTOMY     CERVICAL CONIZATION W/BX N/A 06/12/2018   Procedure: CONIZATION CERVIX WITH BIOPSY - COLD KNIFE;  Surgeon: Gae Dry, MD;  Location: [facility];  Service: Gynecology;  Laterality: N/A;   CESAREAN SECTION N/A 03/20/2018   Procedure: CESAREAN SECTION;  Surgeon: Gae Dry, MD;  Location: [facility];  Service: Obstetrics;  Laterality: N/A;   CYSTOSCOPY  07/19/2020   Procedure: CYSTOSCOPY;  Surgeon: Gae Dry, MD;  Location: [facility];  Service: Gynecology;;   Consuela Mimes W/ RETROGRADES  07/19/2020   Procedure: CYSTOSCOPY WITH RETROGRADE PYELOGRAM;  Surgeon: Abbie Sons, MD;  Location: [facility];  Service: Urology;;   Consuela Mimes W/ RETROGRADES Right 10/04/2020   Procedure: CYSTOSCOPY WITH RETROGRADE PYELOGRAM;  Surgeon: Abbie Sons, MD;  Location: [facility];  Service: Urology;  Laterality: Right;   CYSTOSCOPY W/ URETERAL STENT PLACEMENT Right 07/19/2020   Procedure: CYSTOSCOPY WITH RETROGRADE PYELOGRAM/URETERAL STENT PLACEMENT;  Surgeon: Abbie Sons, MD;  Location: [facility];  Service: Urology;  Laterality: Right;   CYSTOSCOPY W/ URETERAL STENT REMOVAL Right 10/04/2020   Procedure: CYSTOSCOPY WITH STENT REMOVAL;  Surgeon: Abbie Sons, MD;  Location: [facility];  Service: Urology;  Laterality: Right;   CYSTOSCOPY WITH URETEROSCOPY AND STENT PLACEMENT Right 08/16/2020   Procedure: CYSTOSCOPY WITH URETEROSCOPY  AND STENT EXCHANGE;  Surgeon: Abbie Sons, MD;  Location: [facility];  Service: Urology;  Laterality: Right;   TOTAL LAPAROSCOPIC HYSTERECTOMY WITH SALPINGECTOMY Bilateral 07/19/2020   Procedure: TOTAL LAPAROSCOPIC HYSTERECTOMY WITH SALPINGECTOMY;  Surgeon: Gae Dry,  MD;  Location: [facility];  Service: Gynecology;  Laterality: Bilateral;   TUBAL LIGATION Bilateral 03/20/2018   Procedure: BILATERAL TUBAL LIGATION;  Surgeon: Gae Dry, MD;  Location: [facility];  Service: Obstetrics;  Laterality: Bilateral;   URETEROSCOPY Right 10/04/2020   Procedure: URETEROSCOPY;  Surgeon: Abbie Sons, MD;  Location: [facility];  Service: Urology;  Laterality: Right;    Home Medications:  Allergies as of 01/05/2021   No Known Allergies      Medication List        Accurate as of January 05, 2021  1:42 PM. If you have any questions, ask your nurse or doctor.          acetaminophen 500 MG tablet Commonly known as: TYLENOL Take 500-1,000 mg by mouth every 6 (six) hours as needed (for pain.).   phentermine 37.5 MG tablet Commonly known as: ADIPEX-P Take 1 tablet (37.5 mg total) by mouth daily before breakfast. One tablet po in morning.   topiramate 25 MG tablet Commonly known as: TOPAMAX TAKE 1 TABLET BY MOUTH TWICE A DAY        Allergies: No Known Allergies  Family History: Family History  Problem Relation Age of Onset   Diabetes Mother    Arthritis Mother    Depression Mother    Anxiety disorder Mother    Anemia Mother    Stroke Mother    Restless legs syndrome Mother    Hyperlipidemia Father    Depression Father    Hypertension Father    ADD / ADHD Brother    Diabetes Maternal Grandmother    Liver disease Maternal Grandmother    Breast cancer Maternal Grandmother    Rheum arthritis Maternal Grandfather    Dementia Maternal Grandfather    Breast cancer Paternal Grandmother    Ovarian cancer Paternal Aunt     Social History:  reports that she quit smoking about 10 months ago. Her smoking use included cigarettes. She started smoking about 21 years ago. She smoked an average of .5 packs per day. She has never used smokeless tobacco. She reports current alcohol use. She reports that she does not use drugs.   Physical Exam: LMP  08/18/2018   Constitutional:  Alert and oriented, No acute distress. HEENT: [place] AT, moist mucus membranes.  Trachea midline, no masses. Cardiovascular: No clubbing, cyanosis, or edema. Respiratory: Normal respiratory effort, no increased work of breathing.    Assessment & Plan:   Minimal hydronephrosis on RUS which appears stable We discussed possibility this could be related to her flank pain She was instructed to call for worsening flank pain We will plan a follow-up RUS in 3 months unless her symptoms worsen Lasix renogram for persistent flank pain   Abbie Sons, MD  [place] [address] [place], [place] [address] [phone]

## 2021-01-06 ENCOUNTER — Encounter: Payer: Self-pay | Admitting: *Deleted

## 2021-01-06 LAB — URINALYSIS, COMPLETE
Bilirubin, UA: NEGATIVE
Glucose, UA: NEGATIVE
Leukocytes,UA: NEGATIVE
Nitrite, UA: NEGATIVE
RBC, UA: NEGATIVE
Specific Gravity, UA: >1.03 — ABNORMAL HIGH (ref 1.005–1.030)
Urobilinogen, Ur: 0.2 mg/dL (ref 0.2–1.0)
pH, UA: 6 (ref 5.0–7.5)

## 2021-01-06 LAB — MICROSCOPIC EXAMINATION

## 2021-01-07 ENCOUNTER — Encounter: Payer: Self-pay | Admitting: Urology

## 2021-03-06 ENCOUNTER — Encounter: Payer: Self-pay | Admitting: Oncology

## 2021-03-13 ENCOUNTER — Other Ambulatory Visit: Payer: Self-pay

## 2021-03-13 ENCOUNTER — Ambulatory Visit
Admission: RE | Admit: 2021-03-13 | Discharge: 2021-03-13 | Disposition: A | Discharge status: Home / Self Care | Payer: Medicaid Other | Source: Ambulatory Visit | Attending: Urology | Admitting: Urology

## 2021-03-13 DIAGNOSIS — N1330 Unspecified hydronephrosis: Secondary | ICD-10-CM | POA: Diagnosis present

## 2021-03-14 ENCOUNTER — Encounter: Payer: Self-pay | Admitting: Urology

## 2021-03-17 ENCOUNTER — Encounter: Payer: Self-pay | Admitting: Oncology

## 2021-04-06 ENCOUNTER — Ambulatory Visit: Payer: Medicaid Other | Admitting: Urology

## 2021-12-21 IMAGING — CR DG CHEST 2V
3 series · 3 of 3 positions shown · non-contrast
Comparison: None.

CLINICAL DATA: 366DF-8E positive, body aches, dizziness, nausea,
vomiting and upper chest pain

EXAM:
CHEST - 2 VIEW

[chest pa (1 of 2)]
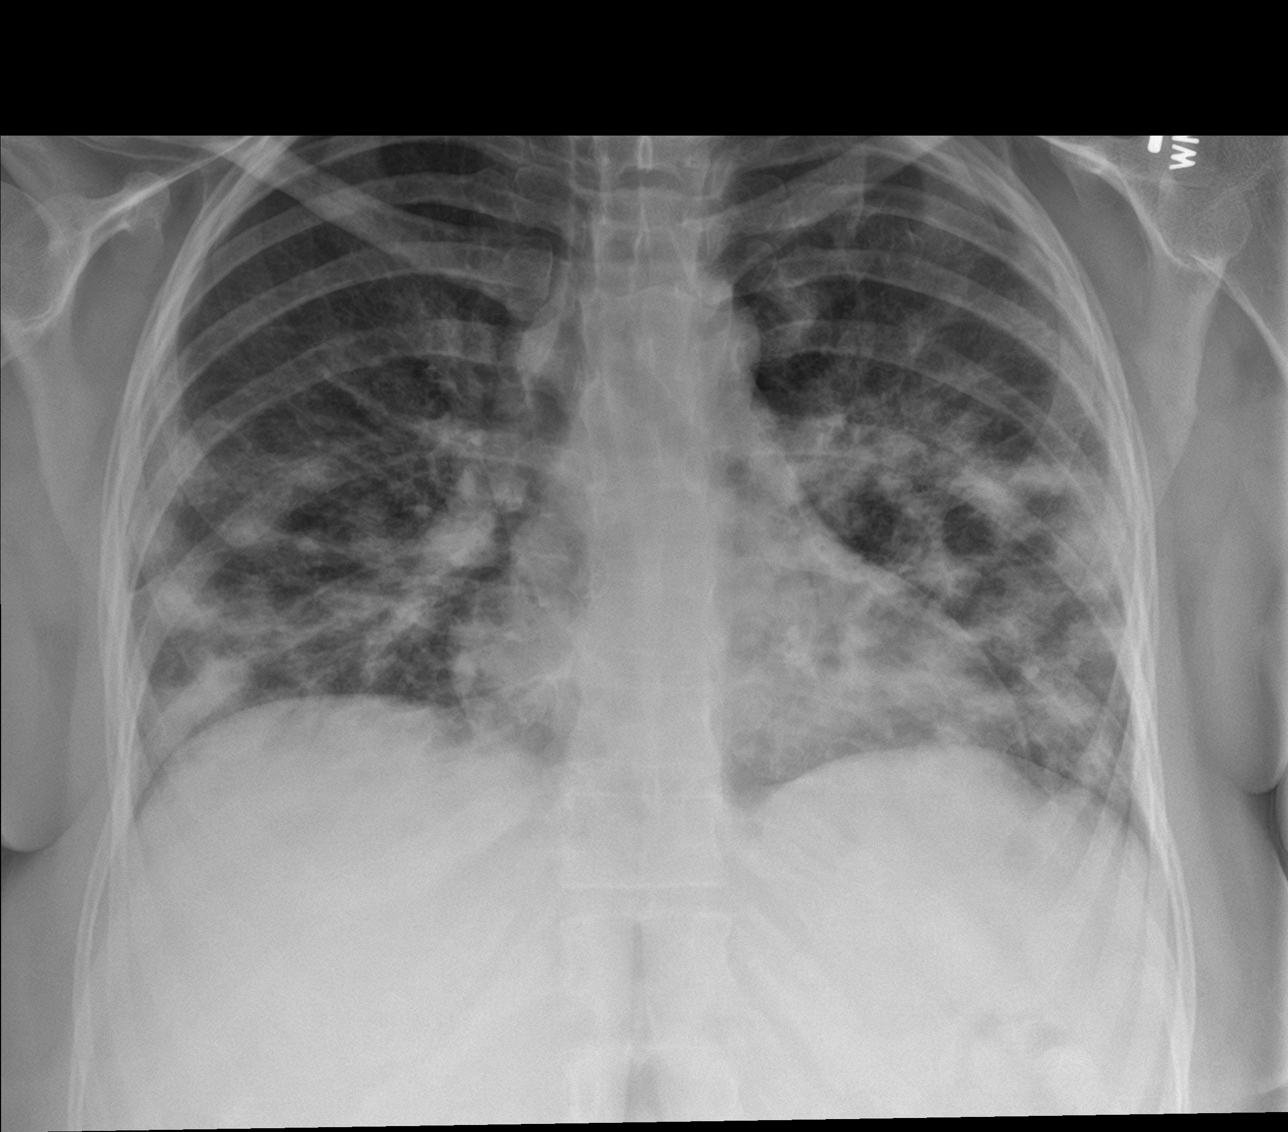

[chest lat]
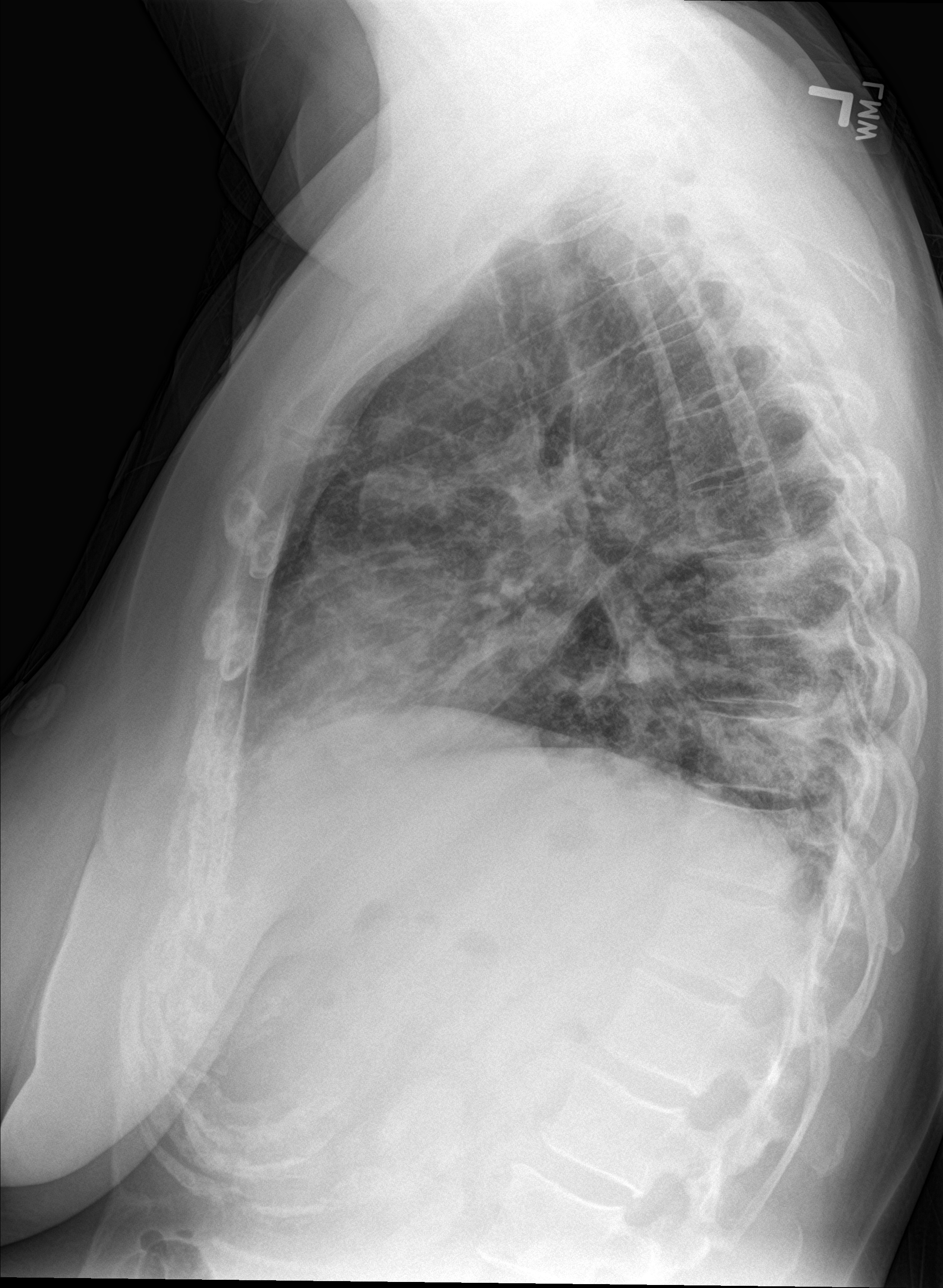

[chest pa (2 of 2)]
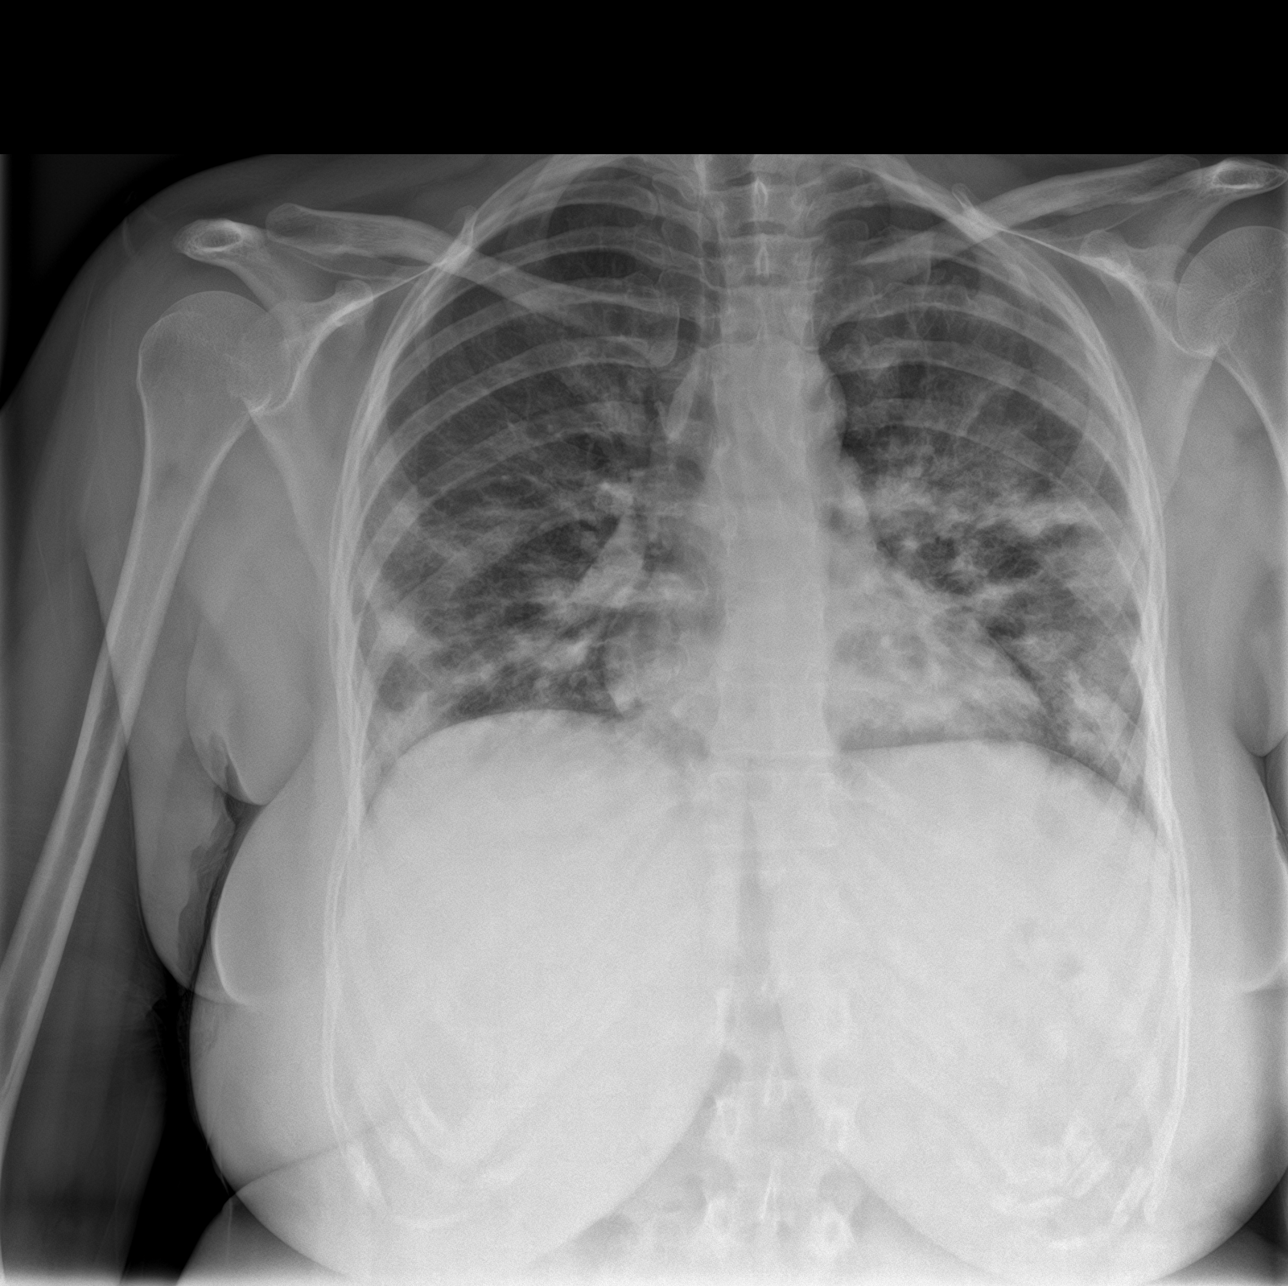

[3 of 3 positions shown; findings below may reference images not displayed]

FINDINGS: Diffuse multifocal opacities throughout both lungs in a mid to lower
lung and peripheral predominance. Some airways thickening and
coarsened interstitial changes present as well. No pneumothorax. No
effusion. The cardiomediastinal contours are unremarkable. No acute
osseous or soft tissue abnormality.
IMPRESSION: Diffuse multifocal opacities throughout both lungs in a mid to lower
lung and peripheral predominance, compatible with multifocal
pneumonia in the setting of 366DF-[id number] positivity.

## 2022-05-22 IMAGING — US US PELVIS COMPLETE WITH TRANSVAGINAL
1 series · 13 of 25 positions shown · non-contrast
Comparison: None

CLINICAL DATA: Initial evaluation for intermittent pelvic pain for
2 years.



[Series 1: us pelvic complete with transvaginal · 13 of 115 slices shown]
[im 1/115]
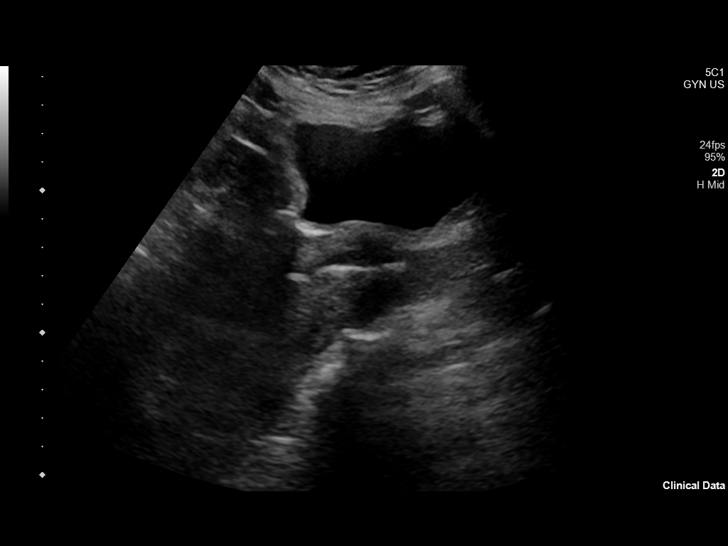
[im 10/115]
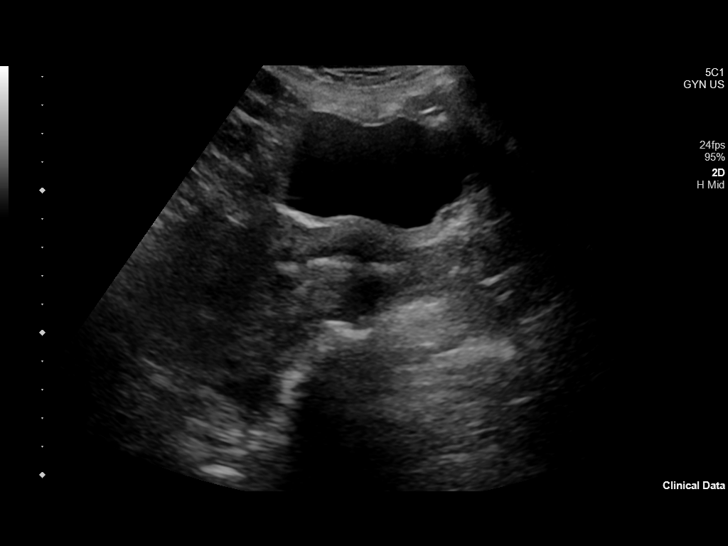
[im 20/115]
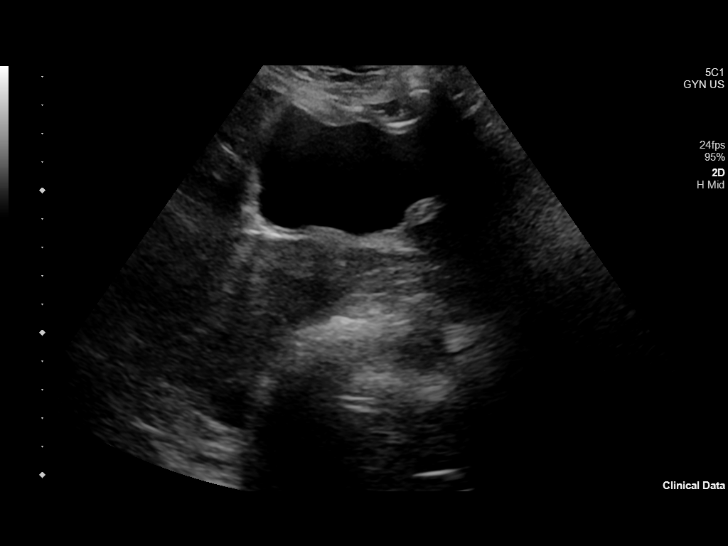
[im 29/115]
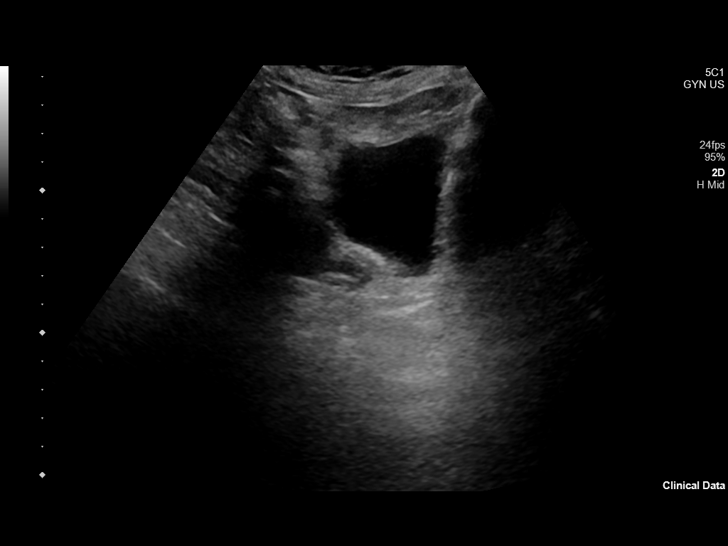
[im 39/115]
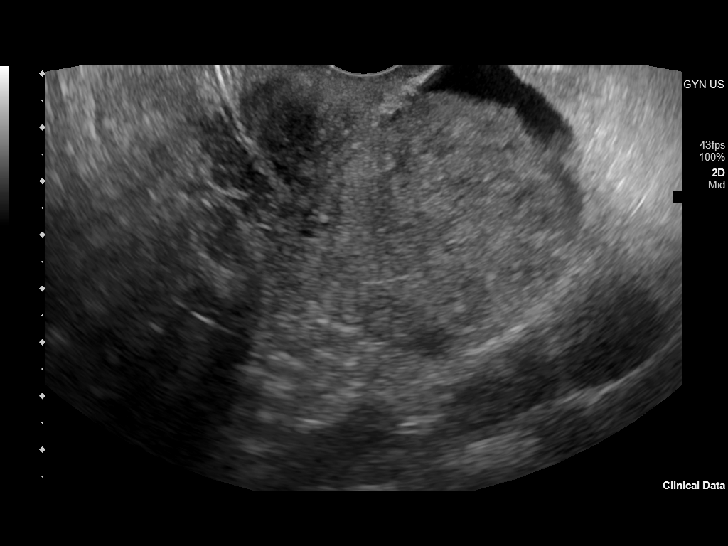
[im 48/115]
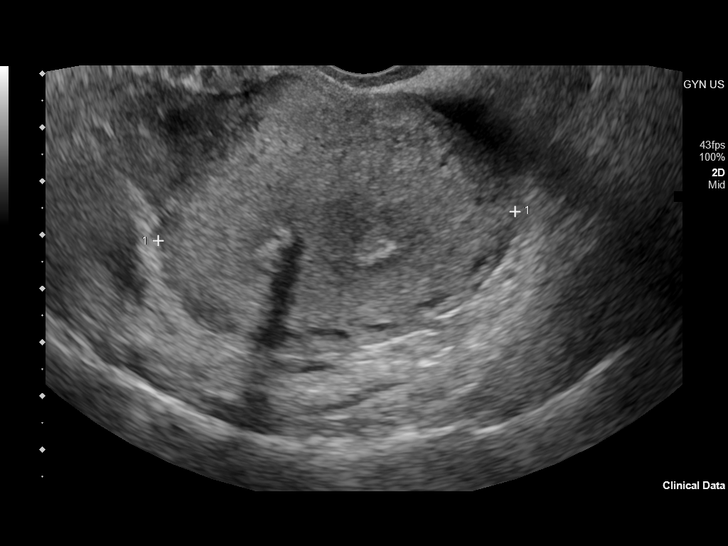
[im 58/115]
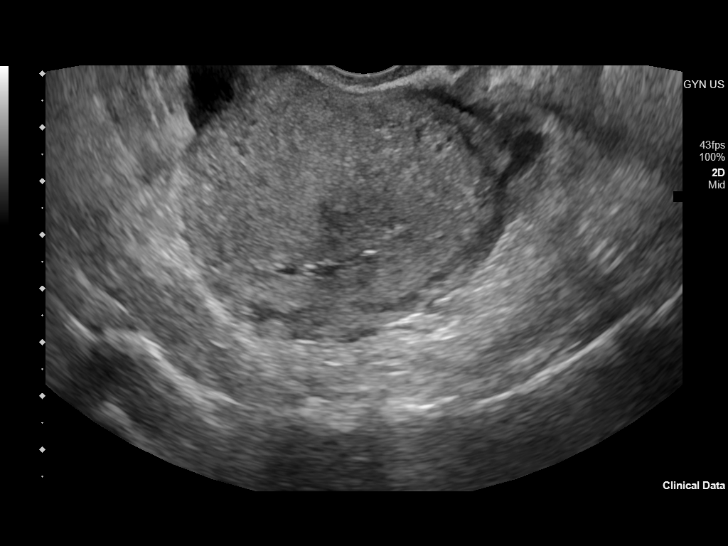
[im 67/115]
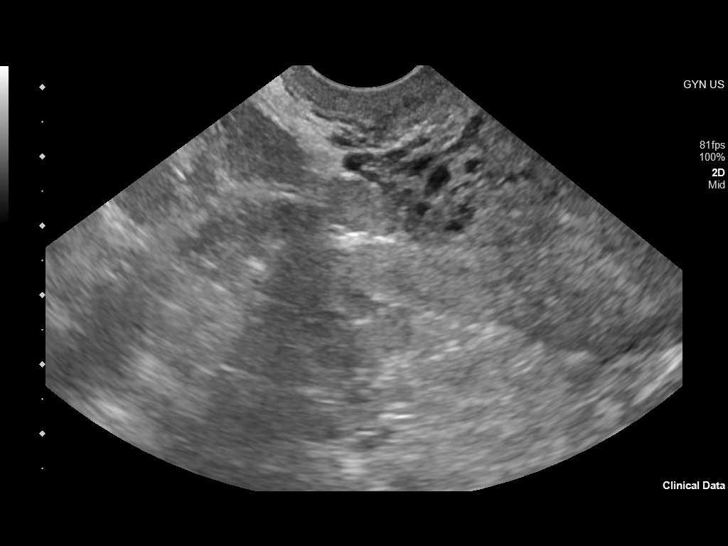
[im 77/115]
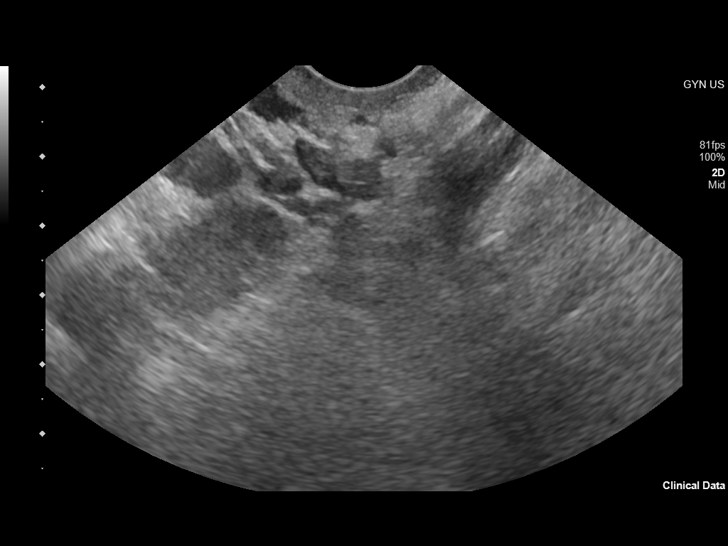
[im 86/115]
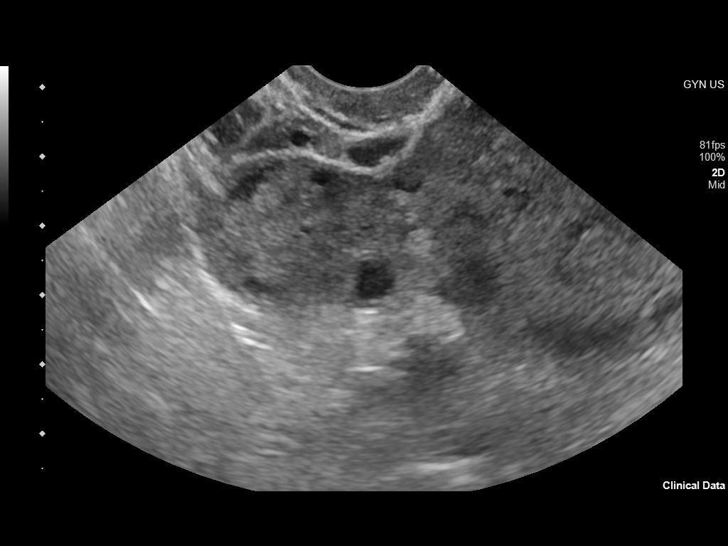
[im 96/115]
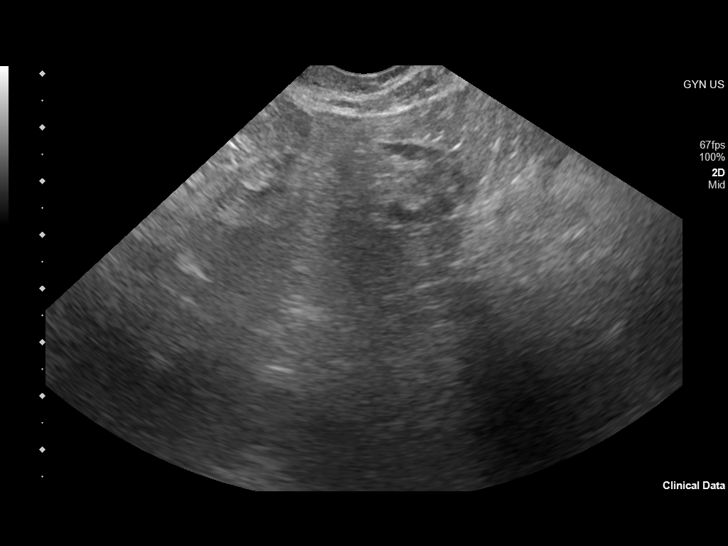
[im 105/115]
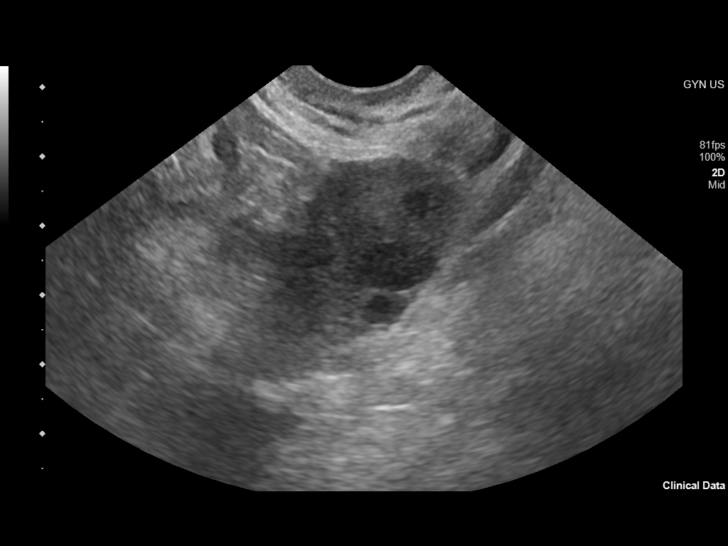
[im 115/115]
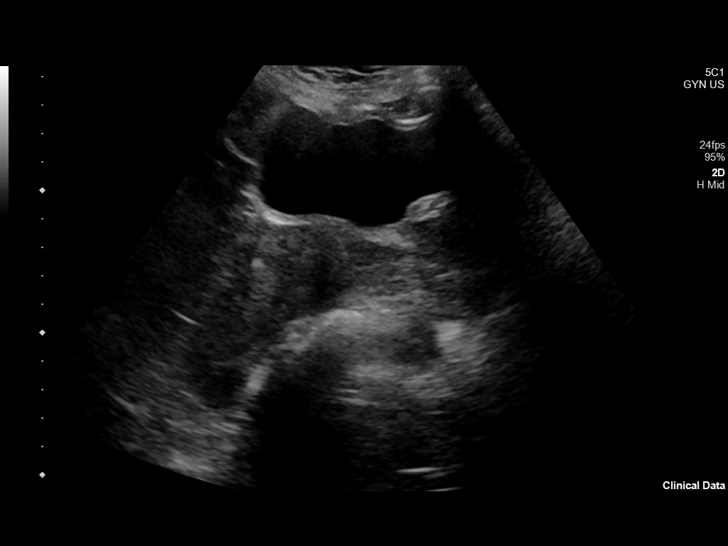

[13 of 25 positions shown; findings below may reference images not displayed]

FINDINGS: Uterus

Measurements: 7.2 x 4.9 x 6.7 cm = volume: 122 mL. No fibroids or
other mass visualized.

Endometrium

Thickness: 3 mm. No focal abnormality visualized. IUD in appropriate
position within the endometrial cavity.

Right ovary

Measurements: 2.7 x 1.6 x 3.1 cm = volume: 6.9 mL. Normal
appearance/no adnexal mass.

Left ovary

Measurements: 3.0 x 2.0 x 2.6 cm = volume: 8.3 mL. Normal
appearance/no adnexal mass.

Other findings

Small volume free physiologic fluid present within the pelvis.
IMPRESSION: 1. Normal pelvic ultrasound. No pelvic or adnexal mass or other
findings to explain patient's symptoms identified.
2. IUD in appropriate position within the endometrial cavity.

## 2022-08-08 IMAGING — CT CT ABD-PELV W/ CM
2 of 5 series · 16 of 46 positions shown, 18 images · IV contrast (omnipaque)
Comparison: None.

CLINICAL DATA: Severe right lower quadrant pain and pelvic pain.
Dysuria. Approximately 3 weeks postop from hysterectomy.

EXAM:
CT ABDOMEN AND PELVIS WITH CONTRAST
TECHNIQUE: Multidetector CT imaging of the abdomen and pelvis was performed
using the standard protocol following bolus administration of
intravenous contrast.
CONTRAST:  100mL OMNIPAQUE IOHEXOL 350 MG/ML SOLN

[Series 2: abd pelvis 5.00 · axial · 0.70mm/px · z∈[-1447,-1052]mm · 13 of 93 slices shown, 15 images]
[im 7/93  soft-tissue]
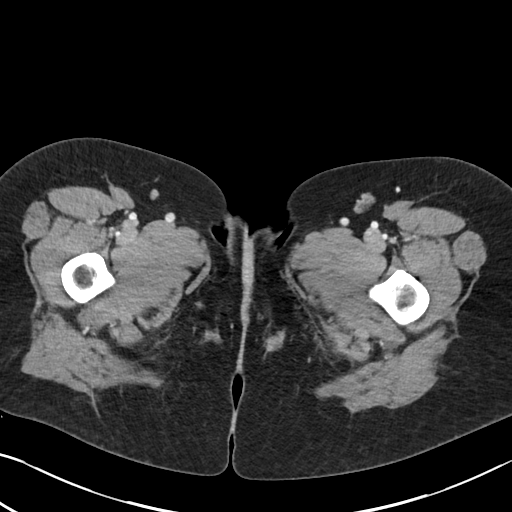
[im 7/93  bone]
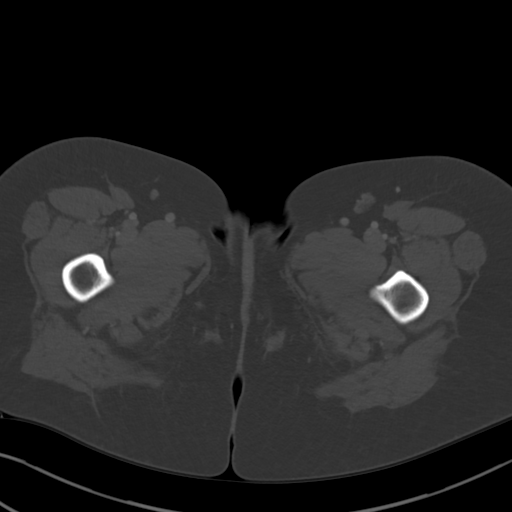
[im 13/93  soft-tissue]
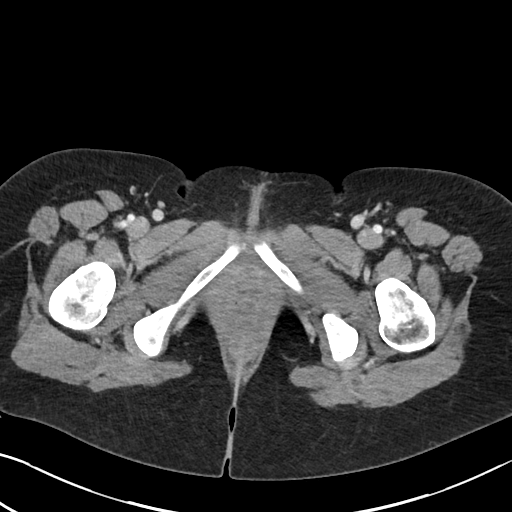
[im 19/93  soft-tissue]
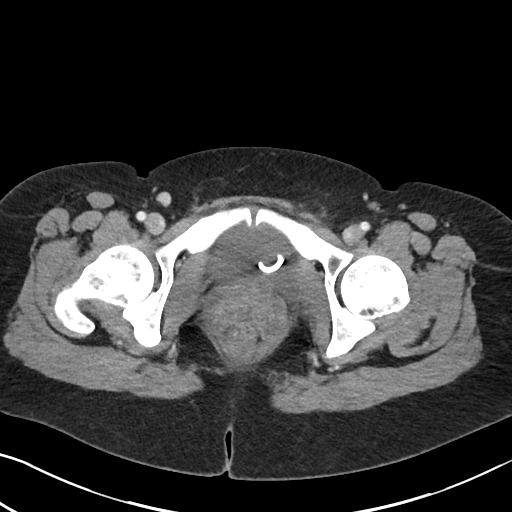
[im 25/93  soft-tissue]
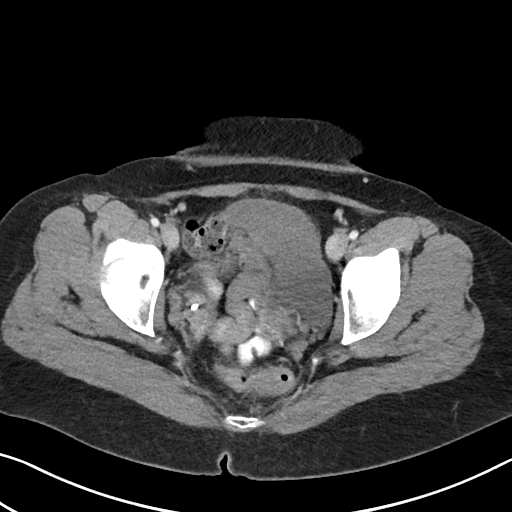
[im 31/93  soft-tissue]
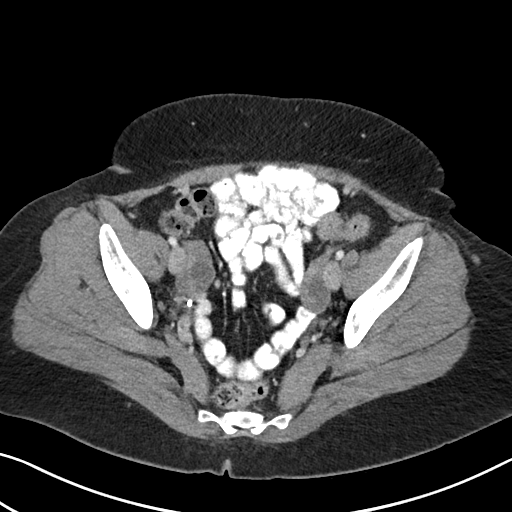
[im 37/93  soft-tissue]
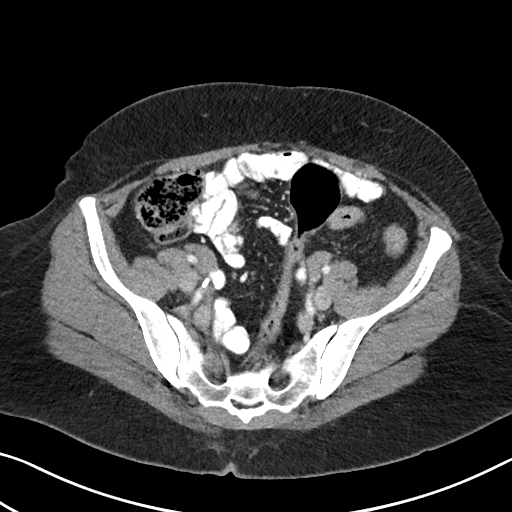
[im 50/93  soft-tissue]
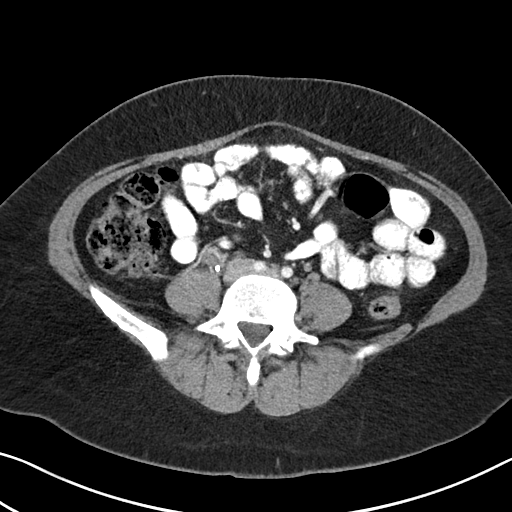
[im 56/93  soft-tissue]
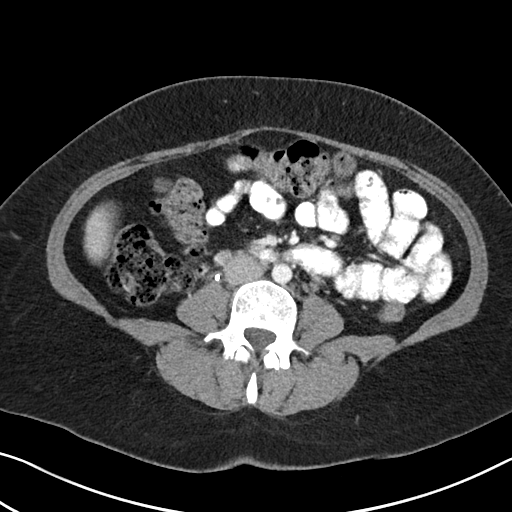
[im 62/93  soft-tissue]
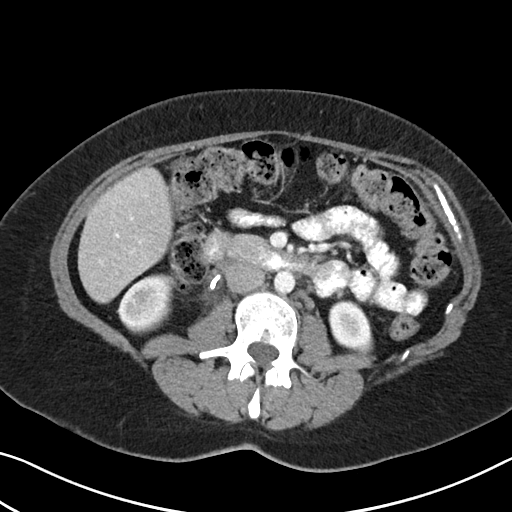
[im 62/93  bone]
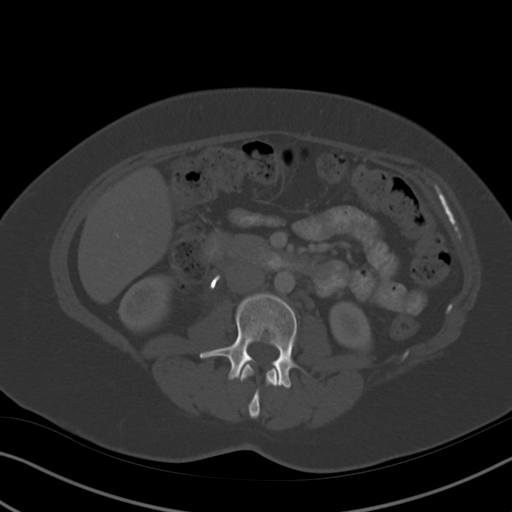
[im 68/93  soft-tissue]
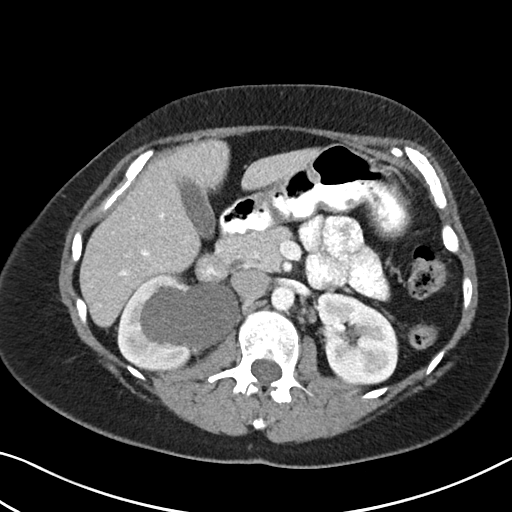
[im 74/93  soft-tissue]
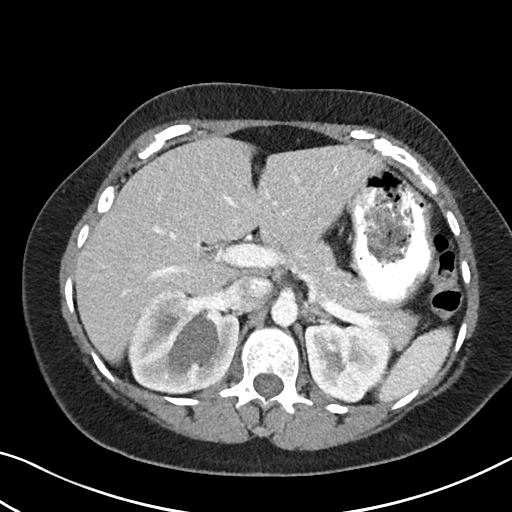
[im 80/93  soft-tissue]
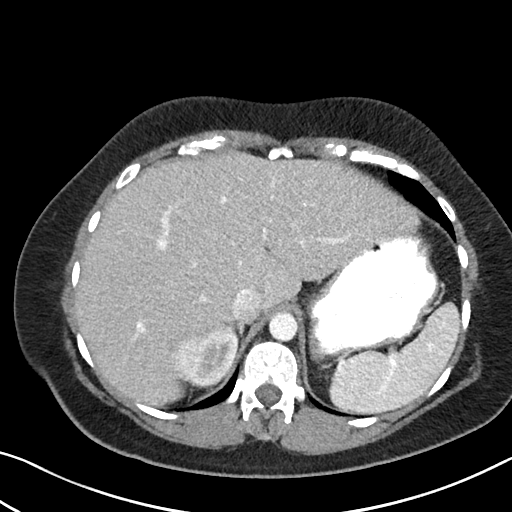
[im 86/93  soft-tissue]
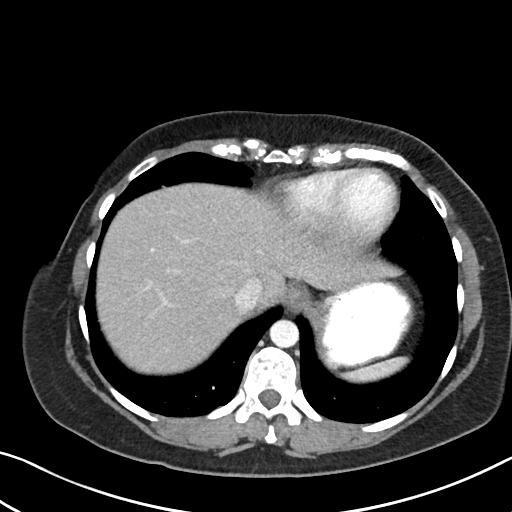

[Series 4: coronals abd pelvis 2.00 cor · coronal · 0.70mm/px · 3 of 147 slices shown]
[im 49/147  soft-tissue]
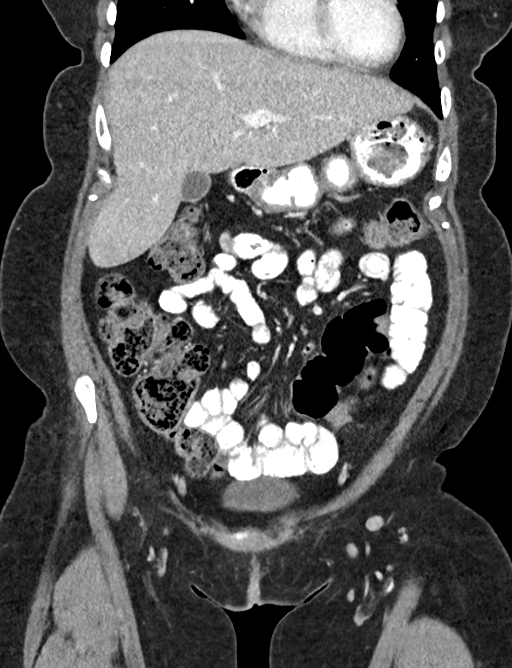
[im 65/147  soft-tissue]
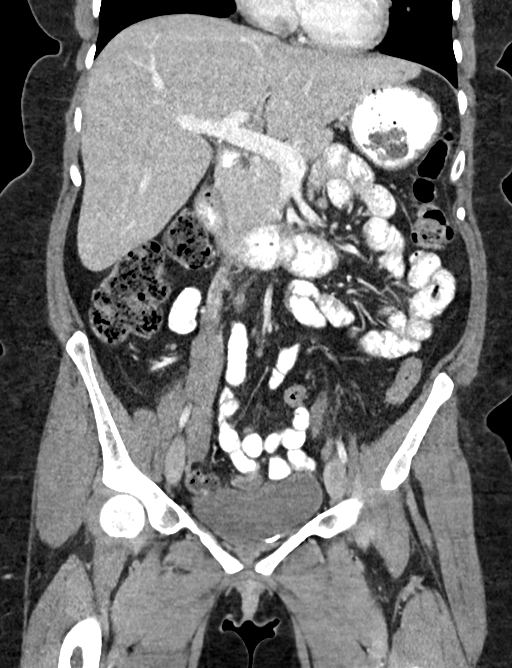
[im 82/147  soft-tissue]
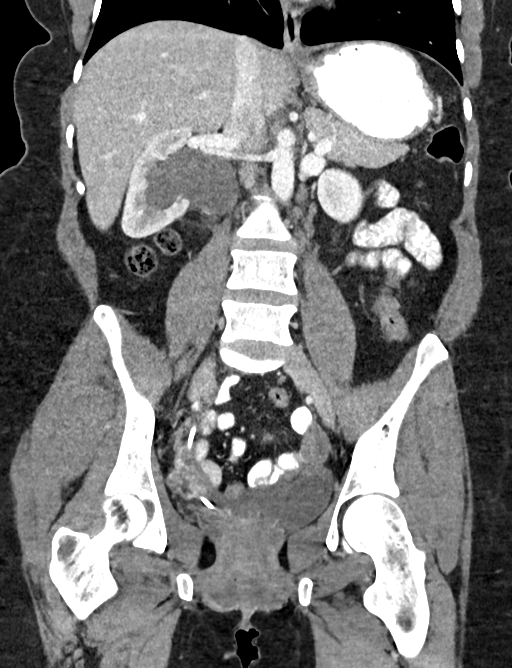

[16 of 46 positions shown; findings below may reference images not displayed]

FINDINGS: Lower chest: No acute findings.

Hepatobiliary: No mass visualized on this unenhanced exam.
Gallbladder is unremarkable. No evidence of biliary ductal
dilatation.

Pancreas: No mass or inflammatory process visualized on this
unenhanced exam.

Spleen:  Within normal limits in size.

Adrenals/Urinary tract: A few tiny right renal cysts are noted. No
evidence of renal mass. Severe right hydronephrosis is seen. A
double pigtail right ureteral stent is seen with proximal pigtail in
the proximal right ureter just below the UPJ. Unremarkable
unopacified urinary bladder.

Stomach/Bowel: No evidence of obstruction, inflammatory process, or
abnormal fluid collections.

Vascular/Lymphatic: No pathologically enlarged lymph nodes
identified. No evidence of abdominal aortic aneurysm.

Reproductive: Prior hysterectomy noted. Adnexal regions are
unremarkable in appearance. No evidence of free fluid or free air.

Other:  None.

Musculoskeletal:  No suspicious bone lesions identified.
IMPRESSION: Severe right hydronephrosis. This may be due to suboptimal position
of right ureteral stent, with proximal pigtail loop in the proximal
right ureter.

No other complication identified.
